# Patient Record
Sex: Male | Born: 1975 | Race: White | Hispanic: No | Marital: Married | Smoking: Former smoker
Health system: Southern US, Community
[De-identification: ages and names within clinical notes are randomized; demographics above are authoritative.]

## PROBLEM LIST (undated history)

## (undated) DIAGNOSIS — K602 Anal fissure, unspecified: Secondary | ICD-10-CM

## (undated) DIAGNOSIS — I10 Essential (primary) hypertension: Secondary | ICD-10-CM

## (undated) DIAGNOSIS — G4733 Obstructive sleep apnea (adult) (pediatric): Secondary | ICD-10-CM

## (undated) DIAGNOSIS — M109 Gout, unspecified: Secondary | ICD-10-CM

## (undated) DIAGNOSIS — F329 Major depressive disorder, single episode, unspecified: Secondary | ICD-10-CM

## (undated) DIAGNOSIS — Z9989 Dependence on other enabling machines and devices: Secondary | ICD-10-CM

## (undated) DIAGNOSIS — K5792 Diverticulitis of intestine, part unspecified, without perforation or abscess without bleeding: Secondary | ICD-10-CM

## (undated) DIAGNOSIS — F32A Depression, unspecified: Secondary | ICD-10-CM

## (undated) DIAGNOSIS — E669 Obesity, unspecified: Secondary | ICD-10-CM

## (undated) DIAGNOSIS — G709 Myoneural disorder, unspecified: Secondary | ICD-10-CM

## (undated) DIAGNOSIS — E079 Disorder of thyroid, unspecified: Secondary | ICD-10-CM

## (undated) DIAGNOSIS — F419 Anxiety disorder, unspecified: Secondary | ICD-10-CM

## (undated) HISTORY — DX: Anal fissure, unspecified: K60.2

## (undated) HISTORY — DX: Depression, unspecified: F32.A

## (undated) HISTORY — DX: Essential (primary) hypertension: I10

## (undated) HISTORY — DX: Anxiety disorder, unspecified: F41.9

## (undated) HISTORY — PX: TOE SURGERY: SHX1073

## (undated) HISTORY — DX: Major depressive disorder, single episode, unspecified: F32.9

## (undated) HISTORY — DX: Diverticulitis of intestine, part unspecified, without perforation or abscess without bleeding: K57.92

## (undated) HISTORY — PX: HIP SURGERY: SHX245

## (undated) HISTORY — DX: Myoneural disorder, unspecified: G70.9

## (undated) HISTORY — DX: Dependence on other enabling machines and devices: Z99.89

## (undated) HISTORY — DX: Gout, unspecified: M10.9

## (undated) HISTORY — DX: Obstructive sleep apnea (adult) (pediatric): G47.33

## (undated) HISTORY — DX: Obesity, unspecified: E66.9

## (undated) HISTORY — DX: Disorder of thyroid, unspecified: E07.9

---

## 2006-04-09 ENCOUNTER — Ambulatory Visit: Payer: Self-pay | Admitting: Family Medicine

## 2008-12-14 ENCOUNTER — Ambulatory Visit: Payer: Self-pay | Admitting: Internal Medicine

## 2008-12-14 ENCOUNTER — Encounter: Payer: Self-pay | Admitting: Internal Medicine

## 2008-12-14 DIAGNOSIS — M546 Pain in thoracic spine: Secondary | ICD-10-CM

## 2008-12-14 DIAGNOSIS — I10 Essential (primary) hypertension: Secondary | ICD-10-CM | POA: Insufficient documentation

## 2009-01-14 ENCOUNTER — Ambulatory Visit: Payer: Self-pay | Admitting: Internal Medicine

## 2009-01-14 DIAGNOSIS — J301 Allergic rhinitis due to pollen: Secondary | ICD-10-CM

## 2009-01-14 LAB — CONVERTED CEMR LAB
ALT: 23 units/L (ref 0–53)
BUN: 14 mg/dL (ref 6–23)
Basophils Absolute: 0 10*3/uL (ref 0.0–0.1)
Bilirubin Urine: NEGATIVE
Bilirubin, Direct: 0.2 mg/dL (ref 0.0–0.3)
CO2: 30 meq/L (ref 19–32)
Chloride: 107 meq/L (ref 96–112)
Cholesterol: 182 mg/dL (ref 0–200)
Creatinine, Ser: 1 mg/dL (ref 0.4–1.5)
Eosinophils Absolute: 0.1 10*3/uL (ref 0.0–0.7)
Glucose, Bld: 95 mg/dL (ref 70–99)
HCT: 43.5 % (ref 39.0–52.0)
Hemoglobin, Urine: NEGATIVE
IgE (Immunoglobulin E), Serum: 21.9 intl units/mL (ref 0.0–180.0)
Leukocytes, UA: NEGATIVE
Lymphocytes Relative: 25.8 % (ref 12.0–46.0)
Lymphs Abs: 1.6 10*3/uL (ref 0.7–4.0)
MCHC: 34.6 g/dL (ref 30.0–36.0)
MCV: 94.8 fL (ref 78.0–100.0)
Monocytes Absolute: 0.4 10*3/uL (ref 0.1–1.0)
Neutrophils Relative %: 65.6 % (ref 43.0–77.0)
Nitrite: NEGATIVE
Platelets: 173 10*3/uL (ref 150.0–400.0)
Potassium: 4.1 meq/L (ref 3.5–5.1)
RDW: 12.6 % (ref 11.5–14.6)
TSH: 3.11 microintl units/mL (ref 0.35–5.50)
Total Bilirubin: 1.2 mg/dL (ref 0.3–1.2)
Total CHOL/HDL Ratio: 3
Total Protein, Urine: NEGATIVE mg/dL
Total Protein: 6.9 g/dL (ref 6.0–8.3)
Triglycerides: 33 mg/dL (ref 0.0–149.0)
Urobilinogen, UA: 0.2 (ref 0.0–1.0)
pH: 6 (ref 5.0–8.0)

## 2009-01-17 ENCOUNTER — Encounter: Payer: Self-pay | Admitting: Internal Medicine

## 2009-06-17 ENCOUNTER — Ambulatory Visit: Payer: Self-pay | Admitting: Internal Medicine

## 2009-06-17 DIAGNOSIS — K602 Anal fissure, unspecified: Secondary | ICD-10-CM

## 2011-01-31 ENCOUNTER — Encounter: Payer: Self-pay | Admitting: Family Medicine

## 2011-01-31 ENCOUNTER — Ambulatory Visit (INDEPENDENT_AMBULATORY_CARE_PROVIDER_SITE_OTHER): Payer: 59 | Admitting: Family Medicine

## 2011-01-31 DIAGNOSIS — E669 Obesity, unspecified: Secondary | ICD-10-CM

## 2011-01-31 DIAGNOSIS — M546 Pain in thoracic spine: Secondary | ICD-10-CM

## 2011-01-31 DIAGNOSIS — F419 Anxiety disorder, unspecified: Secondary | ICD-10-CM

## 2011-01-31 DIAGNOSIS — F418 Other specified anxiety disorders: Secondary | ICD-10-CM | POA: Insufficient documentation

## 2011-01-31 DIAGNOSIS — F341 Dysthymic disorder: Secondary | ICD-10-CM

## 2011-01-31 MED ORDER — CITALOPRAM HYDROBROMIDE 20 MG PO TABS: 20.0000 mg | ORAL_TABLET | Freq: Every day | ORAL | Status: DC

## 2011-01-31 NOTE — Patient Instructions (Signed)
Follow up in 1 month for your complete physical- do not eat before this appt We'll call you with your ortho appt- continue the tylenol or aleve as needed for back pain Start the Celexa daily for mood- if you find it makes you sleepy, take it at dinner Keep up the good work on diet and exercise- that is amazing!!! Call with any questions or concerns Welcome Back!!!

## 2011-01-31 NOTE — Progress Notes (Signed)
  Subjective:    Patient ID: Rodney Mcclain, male    DOB: 1976/03/05, 35 y.o.   MRN: [id number]  HPI Thoracic back pain- occurs daily, almost constantly.  Reports a nagging pain.  Will take Aleve when it gets severe w/out relief.  sxs first started 4-5 yrs ago.  Had back xrays done which showed mild arthritic change at age 69.  Father had similar hx and ended up w/ metastatic cancer.  Has never seen ortho.  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  Feels stuck b/c of the economy.  Wife recently had back surgery and they have a 2 yr old daughter.  Having difficulty sleeping.  Wife wants him to take meds for anxiety, pt feels strongly against meds.  Taking melatonin for sleep w/ some results.  Able to fall asleep but will have difficulty staying asleep- 'can't turn the brain off'.  Drinking a couple of beers nightly to 'take the edge off'.  Obesity- was 455 lbs 5 years ago.  Got down to 237lbs and has gained some weight back.  Goal is now 250lbs.  Exercising regularly.   Review of Systems For ROS see HPI     Objective:   Physical Exam  Constitutional: He is oriented to person, place, and time. He appears well-developed and well-nourished. No distress.       obese  HENT:  Head: Normocephalic and atraumatic.  Eyes: Conjunctivae and EOM are normal. Pupils are equal, round, and reactive to light.  Neck: Normal range of motion. Neck supple. No thyromegaly present.  Cardiovascular: Normal rate, regular rhythm, normal heart sounds and intact distal pulses.   No murmur heard. Pulmonary/Chest: Effort normal and breath sounds normal. No respiratory distress. He has no wheezes.  Abdominal: Soft. Bowel sounds are normal. He exhibits no distension. There is no tenderness.  Musculoskeletal: Normal range of motion.       Thoracic back: He exhibits bony tenderness. He exhibits no deformity.       Back:  Lymphadenopathy:    He has no cervical adenopathy.  Neurological: He is  alert and oriented to person, place, and time. No cranial nerve deficit.  Skin: Skin is warm and dry.  Psychiatric: He has a normal mood and affect. His behavior is normal. Judgment and thought content normal.          Assessment & Plan:

## 2011-02-01 NOTE — Assessment & Plan Note (Signed)
Pt concerned b/c he feels he is too young for arthritis of the spine.  Agree that the thoracic spine is unusual site for arthritis.  Given that pt's father had similar sxs and it was bony metastasis and this is what pt fears- will refer to ortho for complete workup.

## 2011-02-01 NOTE — Assessment & Plan Note (Signed)
This is currently pt's biggest concern.  Given that his stressors are unlikely to change will start low dose meds to see if we can change how he responds to them.  Discussed counseling- pt not interested at this time.  Will follow closely.

## 2011-02-01 NOTE — Assessment & Plan Note (Signed)
Pt has lost considerable amount of weight from where he used to be.  Goal is to be about 50 lbs lighter than his current weight.  Is exercising regularly- which i applauded.  Discussed the importance of healthy food choices.  Pt aware.  Will follow.

## 2011-03-03 ENCOUNTER — Ambulatory Visit (INDEPENDENT_AMBULATORY_CARE_PROVIDER_SITE_OTHER): Payer: 59 | Admitting: Family Medicine

## 2011-03-03 ENCOUNTER — Encounter: Payer: Self-pay | Admitting: *Deleted

## 2011-03-03 DIAGNOSIS — Z0000 Encounter for general adult medical examination without abnormal findings: Secondary | ICD-10-CM

## 2011-03-03 DIAGNOSIS — F341 Dysthymic disorder: Secondary | ICD-10-CM

## 2011-03-03 DIAGNOSIS — F329 Major depressive disorder, single episode, unspecified: Secondary | ICD-10-CM

## 2011-03-03 LAB — CBC WITH DIFFERENTIAL/PLATELET
Basophils Absolute: 0 10*3/uL (ref 0.0–0.1)
Basophils Relative: 0.7 % (ref 0.0–3.0)
Eosinophils Absolute: 0.1 10*3/uL (ref 0.0–0.7)
HCT: 43.2 % (ref 39.0–52.0)
Hemoglobin: 15 g/dL (ref 13.0–17.0)
Lymphs Abs: 1 10*3/uL (ref 0.7–4.0)
MCHC: 34.8 g/dL (ref 30.0–36.0)
MCV: 97.2 fl (ref 78.0–100.0)
Monocytes Absolute: 0.5 10*3/uL (ref 0.1–1.0)
Neutro Abs: 2.7 10*3/uL (ref 1.4–7.7)
Platelets: 142 10*3/uL — ABNORMAL LOW (ref 150.0–400.0)
RBC: 4.44 Mil/uL (ref 4.22–5.81)
RDW: 13.7 % (ref 11.5–14.6)

## 2011-03-03 LAB — LIPID PANEL
Cholesterol: 182 mg/dL (ref 0–200)
HDL: 74 mg/dL (ref >39.00)
LDL Cholesterol: 103 mg/dL — ABNORMAL HIGH (ref 0–99)
Total CHOL/HDL Ratio: 2
Triglycerides: 24 mg/dL (ref 0.0–149.0)
VLDL: 4.8 mg/dL (ref 0.0–40.0)

## 2011-03-03 LAB — HEPATIC FUNCTION PANEL
AST: 21 U/L (ref 0–37)
Albumin: 4 g/dL (ref 3.5–5.2)
Alkaline Phosphatase: 50 U/L (ref 39–117)
Bilirubin, Direct: 0.2 mg/dL (ref 0.0–0.3)
Total Bilirubin: 0.8 mg/dL (ref 0.3–1.2)
Total Protein: 6.8 g/dL (ref 6.0–8.3)

## 2011-03-03 LAB — BASIC METABOLIC PANEL
Calcium: 9.2 mg/dL (ref 8.4–10.5)
Creatinine, Ser: 1.1 mg/dL (ref 0.4–1.5)
GFR: 83.5 mL/min (ref >60.00)
Glucose, Bld: 92 mg/dL (ref 70–99)
Potassium: 4.7 mEq/L (ref 3.5–5.1)
Sodium: 142 mEq/L (ref 135–145)

## 2011-03-03 LAB — TSH: TSH: 3.27 u[IU]/mL (ref 0.35–5.50)

## 2011-03-03 MED ORDER — VENLAFAXINE HCL ER 37.5 MG PO CP24: 37.5000 mg | ORAL_CAPSULE | Freq: Every day | ORAL | Status: DC

## 2011-03-03 NOTE — Progress Notes (Signed)
  Subjective:    Patient ID: Rodney Mcclain, male    DOB: 1976/06/23, 35 y.o.   MRN: [id number]  HPI CPE- no concerns today.  Depression- feels mood has improved since starting Celexa.  Sleeping better.  Doesn't notice an impact on motivation.  Doesn't want to go up on dose due to sedation and lack of energy.   Review of Systems Patient reports no  vision/ hearing changes,anorexia, weight change, fever ,adenopathy, persistant / recurrent hoarseness, swallowing issues, chest pain,palpitations, edema,persistant / recurrent cough, hemoptysis, dyspnea(rest, exertional, paroxysmal nocturnal), gastrointestinal  bleeding (melena, rectal bleeding), abdominal pain, excessive heart burn, GU symptoms( dysuria, hematuria, pyuria, voiding/incontinence  Issues) syncope, focal weakness, memory loss,numbness & tingling, skin/hair/nail changes, abnormal bruising/bleeding, musculoskeletal symptoms/signs.     Objective:   Physical Exam BP 120/82  Pulse 58  Temp(Src) 99 F (37.2 C) (Oral)  Resp 16  Ht 6' 0.75" (1.848 m)  Wt 293 lb (132.904 kg)  BMI 38.92 kg/m2  SpO2 98%  General Appearance:    Alert, cooperative, no distress, appears stated age  Head:    Normocephalic, without obvious abnormality, atraumatic  Eyes:    PERRL, conjunctiva/corneas clear, EOM's intact, fundi    benign, both eyes       Ears:    Normal TM's and external ear canals, both ears  Nose:   Nares normal, septum midline, mucosa normal, no drainage   or sinus tenderness  Throat:   Lips, mucosa, and tongue normal; teeth and gums normal  Neck:   Supple, symmetrical, trachea midline, no adenopathy;       thyroid:  No enlargement/tenderness/nodules  Back:     Symmetric, no curvature, ROM normal, no CVA tenderness  Lungs:     Clear to auscultation bilaterally, respirations unlabored  Chest wall:    No tenderness or deformity  Heart:    Regular rate and rhythm, S1 and S2 normal, no murmur, rub   or gallop  Abdomen:     Soft, non-tender,  bowel sounds active all four quadrants,    no masses, no organomegaly  Genitalia:    Normal male without lesion, masses, discharge or tenderness  Rectal:    Deferred  Extremities:   Extremities normal, atraumatic, no cyanosis or edema  Pulses:   2+ and symmetric all extremities  Skin:   Skin color, texture, turgor normal, no rashes or lesions  Lymph nodes:   Cervical, supraclavicular, and axillary nodes normal  Neurologic:   CNII-XII intact. Normal strength, sensation and reflexes      throughout          Assessment & Plan:

## 2011-03-03 NOTE — Patient Instructions (Signed)
Follow up in 4-6 weeks to recheck mood We'll notify you of your lab results Start the Effexor daily Call with any questions or concerns Have a great holiday!

## 2011-03-07 ENCOUNTER — Encounter: Payer: Self-pay | Admitting: Family Medicine

## 2011-03-07 NOTE — Assessment & Plan Note (Signed)
Pt's PE WNL.  Check labs.  Anticipatory guidance provided.  

## 2011-03-07 NOTE — Assessment & Plan Note (Signed)
Mood has improved but is feeling sedated and lethargic on celexa.  Will switch to SNRI which is typically more activating and see if pt's sxs improve.  Will continue to follow closely.

## 2011-04-07 ENCOUNTER — Ambulatory Visit (INDEPENDENT_AMBULATORY_CARE_PROVIDER_SITE_OTHER): Payer: 59 | Admitting: Family Medicine

## 2011-04-07 DIAGNOSIS — M546 Pain in thoracic spine: Secondary | ICD-10-CM

## 2011-04-07 DIAGNOSIS — F341 Dysthymic disorder: Secondary | ICD-10-CM

## 2011-04-07 DIAGNOSIS — F329 Major depressive disorder, single episode, unspecified: Secondary | ICD-10-CM

## 2011-04-07 MED ORDER — VENLAFAXINE HCL ER 75 MG PO CP24: 75.0000 mg | ORAL_CAPSULE | Freq: Every day | ORAL | Status: DC

## 2011-04-07 NOTE — Progress Notes (Signed)
  Subjective:    Patient ID: Rodney Mcclain, male    DOB: [birth year], 35 y.o.   MRN: [id number]  HPI Anxiety/depression- Effexor did not impact mood but improved the sleep.  According to wife mood still needs work- still short, quick to react.  Pt doesn't feel mood is an issue.  Work situation has improved.  Pt reports he just has 'no motivation'.  Thoracic back pain- pt reports ortho 'blew me off'.  Pt and wife remained concerned since father had similar back pain and was told it was arthritis when it was actually cancer.   Review of Systems For ROS see HPI     Objective:   Physical Exam  Constitutional: He appears well-developed and well-nourished. No distress.       obese  Psychiatric: He has a normal mood and affect. His behavior is normal. Judgment and thought content normal.       Somewhat flat affect          Assessment & Plan:

## 2011-04-07 NOTE — Assessment & Plan Note (Signed)
Pt still fears that his back pain is something more than arthritis.  Wife is 'bothering me' to get it evaluated.  She would like him to see her doctor- Venetia Maxon.  Will refer.

## 2011-04-07 NOTE — Patient Instructions (Signed)
Start the new Effexor- 75mg  daily Call me and let me know how it's going Try and force the exercise issue- once you're back in routine you'll thank yourself! Call with any questions or concerns!

## 2011-04-07 NOTE — Assessment & Plan Note (Signed)
Pt's sleep has improved which will likely help depression and anxiety.  Increase effexor to 75mg  daily.  Pt to call and let me know how sxs are after 3-4 weeks.

## 2011-08-04 ENCOUNTER — Ambulatory Visit (INDEPENDENT_AMBULATORY_CARE_PROVIDER_SITE_OTHER): Payer: 59 | Admitting: Family Medicine

## 2011-08-04 VITALS — BP 122/90 | HR 65 | Temp 97.6°F | Ht 72.0 in | Wt 319.0 lb

## 2011-08-04 DIAGNOSIS — J029 Acute pharyngitis, unspecified: Secondary | ICD-10-CM

## 2011-08-04 LAB — POCT RAPID STREP A (OFFICE): Rapid Strep A Screen: NEGATIVE

## 2011-08-04 NOTE — Progress Notes (Signed)
  Subjective:    Patient ID: Rodney Mcclain, male    DOB: 25-Jun-1976, 34 y.o.   MRN: [id number]  HPI Sore throat- sxs started Sunday.  + sick contacts.  No fevers.  + cough at night, typically dry but will loosen in the shower.  + strep contact.  Feeling better than yesterday.  Denies sinus pain/pressure.   Review of Systems For ROS see HPI     Objective:   Physical Exam  Vitals reviewed. Constitutional: He appears well-developed and well-nourished. No distress.  HENT:  Head: Normocephalic and atraumatic.       No TTP over sinuses + turbinate edema + PND TMs normal bilaterally  Eyes: Conjunctivae and EOM are normal. Pupils are equal, round, and reactive to light.  Neck: Normal range of motion. Neck supple.  Cardiovascular: Normal rate, regular rhythm and normal heart sounds.   Pulmonary/Chest: Effort normal and breath sounds normal. No respiratory distress. He has no wheezes.  Lymphadenopathy:    He has no cervical adenopathy.  Skin: Skin is warm and dry.          Assessment & Plan:

## 2011-08-04 NOTE — Patient Instructions (Signed)
Good news!  No strep! This is most likely viral and should improve w/ time Alternate tylenol/ibuprofen as needed for pain/fever Robitussin or Delsym as needed for cough REST! Plenty of fluids! Hang in there!

## 2011-08-05 DIAGNOSIS — J029 Acute pharyngitis, unspecified: Secondary | ICD-10-CM | POA: Insufficient documentation

## 2011-08-05 NOTE — Assessment & Plan Note (Signed)
No evidence of bacterial infxn on exam, rapid strep (-).  Most likely viral/allergy combo.  Reviewed supportive care and red flags that should prompt return.  Pt expressed understanding and is in agreement w/ plan.

## 2012-01-01 ENCOUNTER — Telehealth: Payer: Self-pay | Admitting: Family Medicine

## 2012-01-01 NOTE — Telephone Encounter (Signed)
Agree w/ evaluation.

## 2012-01-01 NOTE — Telephone Encounter (Signed)
Caller: Rodney Mcclain/Patient; PCP: Sheliah Hatch.; CB#: [phone]; Call regarding Abd Pain; Sharp lower abd pain onset 12/31/11. Pain goes away when sitting still. Increases with movement. No pain at present time. Last BM this AM WNL. Advised see in 24 hrs if pain persists. Abd Pain Protocol.

## 2013-06-16 ENCOUNTER — Encounter: Payer: Self-pay | Admitting: Family Medicine

## 2013-06-16 ENCOUNTER — Ambulatory Visit (INDEPENDENT_AMBULATORY_CARE_PROVIDER_SITE_OTHER): Payer: Managed Care, Other (non HMO) | Admitting: Family Medicine

## 2013-06-16 VITALS — BP 140/86 | HR 91 | Temp 97.9°F | Ht 72.75 in | Wt 329.8 lb

## 2013-06-16 DIAGNOSIS — M62838 Other muscle spasm: Secondary | ICD-10-CM | POA: Insufficient documentation

## 2013-06-16 MED ORDER — NAPROXEN 500 MG PO TABS: 500.0000 mg | ORAL_TABLET | Freq: Two times a day (BID) | ORAL | Status: DC

## 2013-06-16 MED ORDER — CYCLOBENZAPRINE HCL 10 MG PO TABS: 10.0000 mg | ORAL_TABLET | Freq: Three times a day (TID) | ORAL | Status: DC | PRN

## 2013-06-16 NOTE — Assessment & Plan Note (Signed)
New.  Pt w/ radicular neck sxs and + trap spasm on left.  Will start by treating trap spasm w/ muscle relaxers and NSAIDs to see if radicular sxs improve.  If not, will get MRI and refer to neurosurg.

## 2013-06-16 NOTE — Patient Instructions (Addendum)
Continue to use the heating pad Start the flexeril at night- can try 1/2 tab during the day but may cause drowsiness Take the Naproxen twice daily- take w/ food- x10 days and then as needed Call if no improvement and we'll get an MRI Hang in there!!

## 2013-06-16 NOTE — Progress Notes (Signed)
  Subjective:    Patient ID: Rodney Mcclain, male    DOB: 30-Jan-1976, 37 y.o.   MRN: [id number]  HPI L shoulder pain- sxs started 2-3 weeks ago w/out known injury.  Woke up in pain.  Pain is located along trap.  No relief w/ massage, heating pad, wife's TENs.  No relief w/ Aleve.  Pain worsens w/ neck movement.  Full ROM of shoulder.  No weakness/numbness of arm.  Review of Systems For ROS see HPI     Objective:   Physical Exam  Vitals reviewed. Constitutional: He is oriented to person, place, and time. He appears well-developed and well-nourished. No distress.  Musculoskeletal:  Full ROM of L shoulder Discomfort w/ forward flexion of neck Pain w/ neck extension and rotation to L  Neurological: He is alert and oriented to person, place, and time. He has normal reflexes. No cranial nerve deficit. Coordination normal.  Skin: Skin is warm and dry.  Psychiatric: He has a normal mood and affect. His behavior is normal.          Assessment & Plan:

## 2013-07-23 ENCOUNTER — Encounter: Payer: Self-pay | Admitting: Family Medicine

## 2013-11-19 ENCOUNTER — Encounter: Payer: Self-pay | Admitting: Family Medicine

## 2013-11-19 ENCOUNTER — Ambulatory Visit (INDEPENDENT_AMBULATORY_CARE_PROVIDER_SITE_OTHER): Payer: Managed Care, Other (non HMO) | Admitting: Family Medicine

## 2013-11-19 VITALS — BP 142/98 | HR 111 | Temp 99.6°F | Resp 16 | Wt 345.2 lb

## 2013-11-19 DIAGNOSIS — R1032 Left lower quadrant pain: Secondary | ICD-10-CM

## 2013-11-19 DIAGNOSIS — R109 Unspecified abdominal pain: Secondary | ICD-10-CM

## 2013-11-19 DIAGNOSIS — R82998 Other abnormal findings in urine: Secondary | ICD-10-CM

## 2013-11-19 DIAGNOSIS — R1031 Right lower quadrant pain: Secondary | ICD-10-CM

## 2013-11-19 LAB — BASIC METABOLIC PANEL
BUN: 15 mg/dL (ref 6–23)
CHLORIDE: 105 meq/L (ref 96–112)
CO2: 25 meq/L (ref 19–32)
CREATININE: 1 mg/dL (ref 0.4–1.5)
Calcium: 8.9 mg/dL (ref 8.4–10.5)
GFR: 85.08 mL/min (ref >60.00)
Glucose, Bld: 104 mg/dL — ABNORMAL HIGH (ref 70–99)
Potassium: 3.8 mEq/L (ref 3.5–5.1)
SODIUM: 139 meq/L (ref 135–145)

## 2013-11-19 LAB — POCT URINALYSIS DIPSTICK
BILIRUBIN UA: NEGATIVE
Blood, UA: NEGATIVE
Glucose, UA: NEGATIVE
Ketones, UA: NEGATIVE
Nitrite, UA: NEGATIVE
PH UA: 6
Protein, UA: NEGATIVE
Spec Grav, UA: <=1.005
Urobilinogen, UA: 0.2

## 2013-11-19 LAB — CBC WITH DIFFERENTIAL/PLATELET
Basophils Absolute: 0 10*3/uL (ref 0.0–0.1)
Basophils Relative: 0.3 % (ref 0.0–3.0)
EOS PCT: 0.9 % (ref 0.0–5.0)
Eosinophils Absolute: 0.1 10*3/uL (ref 0.0–0.7)
HCT: 44.1 % (ref 39.0–52.0)
HEMOGLOBIN: 14.6 g/dL (ref 13.0–17.0)
Lymphocytes Relative: 12.7 % (ref 12.0–46.0)
Lymphs Abs: 1.5 10*3/uL (ref 0.7–4.0)
MCHC: 33.1 g/dL (ref 30.0–36.0)
MCV: 96.5 fl (ref 78.0–100.0)
Monocytes Absolute: 1 10*3/uL (ref 0.1–1.0)
Monocytes Relative: 8.5 % (ref 3.0–12.0)
NEUTROS ABS: 9.2 10*3/uL — AB (ref 1.4–7.7)
NEUTROS PCT: 77.6 % — AB (ref 43.0–77.0)
Platelets: 171 10*3/uL (ref 150.0–400.0)
RBC: 4.57 Mil/uL (ref 4.22–5.81)
RDW: 13 % (ref 11.5–14.6)
WBC: 11.8 10*3/uL — AB (ref 4.5–10.5)

## 2013-11-19 MED ORDER — CIPROFLOXACIN HCL 500 MG PO TABS: 500.0000 mg | ORAL_TABLET | Freq: Two times a day (BID) | ORAL | Status: DC

## 2013-11-19 NOTE — Progress Notes (Signed)
   Subjective:    Patient ID: Rodney Mcclain, male    DOB: 1975/12/08, 38 y.o.   MRN: [id number]  HPI abd pain- sxs started Monday in R flank and traveled around to central abdomen below navel.  Pain is severe w/ BM.  No pain w/ urination.  No hx of kidney stones.  Still has appendix.  No blood in stool.  No recent constipation.  + fever last night.  Pain is constant but will increase in severity.  Has considered going to ER b/c pain is so severe.  No N/V.  Feeling of early satiety and has sensation of needing to have BM almost immediately after eating.  Pain doesn't change w/ eating.   Review of Systems For ROS see HPI     Objective:   Physical Exam  Vitals reviewed. Constitutional: He appears well-developed and well-nourished. No distress.  HENT:  Head: Normocephalic and atraumatic.  Neck: Normal range of motion. Neck supple.  Cardiovascular: Normal rate, regular rhythm and normal heart sounds.   Pulmonary/Chest: Effort normal and breath sounds normal. No respiratory distress. He has no wheezes. He has no rales.  Abdominal: Soft. Bowel sounds are normal. He exhibits no distension and no mass. There is tenderness (bilateral lower quadrant TTP, no CVA tenderness). There is no rebound and no guarding.  Lymphadenopathy:    He has no cervical adenopathy.  Neurological: He is alert.  Skin: Skin is warm and dry.          Assessment & Plan:

## 2013-11-19 NOTE — Assessment & Plan Note (Signed)
New.  Pt's pain is severe for him to come for OV and consider going to ER.  UA shows + leuks, will start Cipro.  Given degree of TTP will get stat CBC and CT abd/pelvis to assess for diverticulitis or possible appendicitis.  Reviewed supportive care and red flags that should prompt return.  Pt expressed understanding and is in agreement w/ plan.

## 2013-11-19 NOTE — Progress Notes (Signed)
Pre visit review using our clinic review tool, if applicable. No additional management support is needed unless otherwise documented below in the visit note. 

## 2013-11-19 NOTE — Patient Instructions (Signed)
We'll notify you of your lab results and CT report After your CT, start Cipro for possible UTI Don't drink or eat anything until after your scan If your symptoms change or worsen, please go to the ER and don't wait for the report Hang in there!!!

## 2013-11-20 ENCOUNTER — Other Ambulatory Visit: Payer: Self-pay | Admitting: General Practice

## 2013-11-20 ENCOUNTER — Encounter: Payer: Self-pay | Admitting: Family Medicine

## 2013-11-20 MED ORDER — METRONIDAZOLE 500 MG PO TABS: 500.0000 mg | ORAL_TABLET | Freq: Three times a day (TID) | ORAL | Status: DC

## 2013-11-21 LAB — URINE CULTURE
Colony Count: NO GROWTH
ORGANISM ID, BACTERIA: NO GROWTH

## 2013-12-15 ENCOUNTER — Encounter: Payer: Self-pay | Admitting: Family Medicine

## 2014-01-19 ENCOUNTER — Encounter: Payer: Self-pay | Admitting: Family Medicine

## 2014-01-19 ENCOUNTER — Ambulatory Visit (INDEPENDENT_AMBULATORY_CARE_PROVIDER_SITE_OTHER): Payer: Managed Care, Other (non HMO) | Admitting: Family Medicine

## 2014-01-19 VITALS — BP 122/82 | HR 68 | Temp 98.2°F | Resp 16 | Wt 351.2 lb

## 2014-01-19 DIAGNOSIS — M109 Gout, unspecified: Secondary | ICD-10-CM | POA: Insufficient documentation

## 2014-01-19 LAB — URIC ACID: URIC ACID, SERUM: 7.7 mg/dL (ref 4.0–7.8)

## 2014-01-19 MED ORDER — ALLOPURINOL 100 MG PO TABS: 100.0000 mg | ORAL_TABLET | Freq: Every day | ORAL | Status: DC

## 2014-01-19 MED ORDER — INDOMETHACIN 50 MG PO CAPS: ORAL_CAPSULE | ORAL | Status: DC

## 2014-01-19 NOTE — Assessment & Plan Note (Signed)
New to provider, ongoing problem for pt.  Start Allopurinol daily.  Indomethacin prn.  Reviewed importance of low purine diet.  Check UA level.  Reviewed supportive care and red flags that should prompt return.  Pt expressed understanding and is in agreement w/ plan.

## 2014-01-19 NOTE — Patient Instructions (Signed)
Follow up as needed Start the Allopurinol daily as a gout preventative Use the Indomethacin as directed to treat a gout flare- only use as long as needed until pain improves Try and follow a low purine diet Call with any questions or concerns Happy Birthday!!!  Gout Gout is an inflammatory arthritis caused by a buildup of uric acid crystals in the joints. Uric acid is a chemical that is normally present in the blood. When the level of uric acid in the blood is too high it can form crystals that deposit in your joints and tissues. This causes joint redness, soreness, and swelling (inflammation). Repeat attacks are common. Over time, uric acid crystals can form into masses (tophi) near a joint, destroying bone and causing disfigurement. Gout is treatable and often preventable. CAUSES  The disease begins with elevated levels of uric acid in the blood. Uric acid is produced by your body when it breaks down a naturally found substance called purines. Certain foods you eat, such as meats and fish, contain high amounts of purines. Causes of an elevated uric acid level include:  Being passed down from parent to child (heredity).  Diseases that cause increased uric acid production (such as obesity, psoriasis, and certain cancers).  Excessive alcohol use.  Diet, especially diets rich in meat and seafood.  Medicines, including certain cancer-fighting medicines (chemotherapy), water pills (diuretics), and aspirin.  Chronic kidney disease. The kidneys are no longer able to remove uric acid well.  Problems with metabolism. Conditions strongly associated with gout include:  Obesity.  High blood pressure.  High cholesterol.  Diabetes. Not everyone with elevated uric acid levels gets gout. It is not understood why some people get gout and others do not. Surgery, joint injury, and eating too much of certain foods are some of the factors that can lead to gout attacks. SYMPTOMS   An attack of gout  comes on quickly. It causes intense pain with redness, swelling, and warmth in a joint.  Fever can occur.  Often, only one joint is involved. Certain joints are more commonly involved:  Base of the big toe.  Knee.  Ankle.  Wrist.  Finger. Without treatment, an attack usually goes away in a few days to weeks. Between attacks, you usually will not have symptoms, which is different from many other forms of arthritis. DIAGNOSIS  Your caregiver will suspect gout based on your symptoms and exam. In some cases, tests may be recommended. The tests may include:  Blood tests.  Urine tests.  X-rays.  Joint fluid exam. This exam requires a needle to remove fluid from the joint (arthrocentesis). Using a microscope, gout is confirmed when uric acid crystals are seen in the joint fluid. TREATMENT  There are two phases to gout treatment: treating the sudden onset (acute) attack and preventing attacks (prophylaxis).  Treatment of an Acute Attack.  Medicines are used. These include anti-inflammatory medicines or steroid medicines.  An injection of steroid medicine into the affected joint is sometimes necessary.  The painful joint is rested. Movement can worsen the arthritis.  You may use warm or cold treatments on painful joints, depending which works best for you.  Treatment to Prevent Attacks.  If you suffer from frequent gout attacks, your caregiver may advise preventive medicine. These medicines are started after the acute attack subsides. These medicines either help your kidneys eliminate uric acid from your body or decrease your uric acid production. You may need to stay on these medicines for a very long time.  The early phase of treatment with preventive medicine can be associated with an increase in acute gout attacks. For this reason, during the first few months of treatment, your caregiver may also advise you to take medicines usually used for acute gout treatment. Be sure you  understand your caregiver's directions. Your caregiver may make several adjustments to your medicine dose before these medicines are effective.  Discuss dietary treatment with your caregiver or dietitian. Alcohol and drinks high in sugar and fructose and foods such as meat, poultry, and seafood can increase uric acid levels. Your caregiver or dietician can advise you on drinks and foods that should be limited. HOME CARE INSTRUCTIONS   Do not take aspirin to relieve pain. This raises uric acid levels.  Only take over-the-counter or prescription medicines for pain, discomfort, or fever as directed by your caregiver.  Rest the joint as much as possible. When in bed, keep sheets and blankets off painful areas.  Keep the affected joint raised (elevated).  Apply warm or cold treatments to painful joints. Use of warm or cold treatments depends on which works best for you.  Use crutches if the painful joint is in your leg.  Drink enough fluids to keep your urine clear or pale yellow. This helps your body get rid of uric acid. Limit alcohol, sugary drinks, and fructose drinks.  Follow your dietary instructions. Pay careful attention to the amount of protein you eat. Your daily diet should emphasize fruits, vegetables, whole grains, and fat-free or low-fat milk products. Discuss the use of coffee, vitamin C, and cherries with your caregiver or dietician. These may be helpful in lowering uric acid levels.  Maintain a healthy body weight. SEEK MEDICAL CARE IF:   You develop diarrhea, vomiting, or any side effects from medicines.  You do not feel better in 24 hours, or you are getting worse. SEEK IMMEDIATE MEDICAL CARE IF:   Your joint becomes suddenly more tender, and you have chills or a fever. MAKE SURE YOU:   Understand these instructions.  Will watch your condition.  Will get help right away if you are not doing well or get worse. Document Released: 09/22/2000 Document Revised:  01/20/2013 Document Reviewed: 05/08/2012 [facility] Patient Information 2014 [place].

## 2014-01-19 NOTE — Progress Notes (Signed)
Pre visit review using our clinic review tool, if applicable. No additional management support is needed unless otherwise documented below in the visit note. 

## 2014-01-19 NOTE — Progress Notes (Signed)
   Subjective:    Patient ID: Rodney Mcclain, male    DOB: Oct 17, 1975, 38 y.o.   MRN: [id number]  HPI Gout- went to UC on 4/1 for pain and swelling in R great toe.  Was started on Colcrys but this was ineffective.  Swelling has improved somewhat and he is now able to bend toe but still painful.   Review of Systems For ROS see HPI     Objective:   Physical Exam  Vitals reviewed. Constitutional: He is oriented to person, place, and time. He appears well-developed and well-nourished. No distress.  obese  Cardiovascular: Intact distal pulses.   Musculoskeletal: He exhibits edema (over R great toe IP joint) and tenderness (over R great toe IP joint).  Neurological: He is alert and oriented to person, place, and time. Coordination normal.  Skin: Skin is warm and dry. No rash noted. There is erythema.  Psychiatric: He has a normal mood and affect. His behavior is normal.          Assessment & Plan:

## 2014-06-23 ENCOUNTER — Ambulatory Visit (INDEPENDENT_AMBULATORY_CARE_PROVIDER_SITE_OTHER): Payer: Managed Care, Other (non HMO) | Admitting: Physician Assistant

## 2014-06-23 ENCOUNTER — Encounter: Payer: Self-pay | Admitting: Physician Assistant

## 2014-06-23 VITALS — HR 63 | Temp 99.0°F | Resp 16 | Ht 72.0 in | Wt 348.0 lb

## 2014-06-23 DIAGNOSIS — L02619 Cutaneous abscess of unspecified foot: Secondary | ICD-10-CM

## 2014-06-23 DIAGNOSIS — L03031 Cellulitis of right toe: Secondary | ICD-10-CM

## 2014-06-23 DIAGNOSIS — L03039 Cellulitis of unspecified toe: Secondary | ICD-10-CM

## 2014-06-23 MED ORDER — SULFAMETHOXAZOLE-TRIMETHOPRIM 800-160 MG PO TABS: 1.0000 | ORAL_TABLET | Freq: Two times a day (BID) | ORAL | Status: DC

## 2014-06-23 NOTE — Patient Instructions (Signed)
Please take antibiotic as directed with food.  Take an antiinflammatory daily -- Aleve/Ibuprofen.  Keep foot clean and dry.  Wear protective shoes.  Call or return to clinic if symptoms are not improving.

## 2014-06-23 NOTE — Progress Notes (Signed)
Pre visit review using our clinic review tool, if applicable. No additional management support is needed unless otherwise documented below in the visit note/SLS  

## 2014-06-24 DIAGNOSIS — L03032 Cellulitis of left toe: Secondary | ICD-10-CM | POA: Insufficient documentation

## 2014-06-24 NOTE — Assessment & Plan Note (Signed)
Rx Bactrim. Elevate extremity.  Wear protective shoes while ambulating. ICE to area.  Return if symptoms not improving over the next 48 hours.

## 2014-06-24 NOTE — Progress Notes (Signed)
    Patient presents to clinic today c/o redness, warmth and swelling of the distal phalanx of left great toe x 2 weeks.  Denies known trauma or injury.  Denies decreased ROM.  Denies drainage from site.  Endorses history of gout but states this feels different to him.  Denies fever, chills, sweats.  Past Medical History  Diagnosis Date  . Hypertension   . Depression   . Gout     Current Outpatient Prescriptions on File Prior to Visit  Medication Sig Dispense Refill  . allopurinol (ZYLOPRIM) 100 MG tablet Take 1 tablet (100 mg total) by mouth daily.  30 tablet  6  . indomethacin (INDOCIN) 50 MG capsule 1 tab TID at first sign of flare and as pain improves decrease to BID and then daily and then D/C as able  60 capsule  0  . Multiple Vitamin (MULTIVITAMIN PO) Take 2 tablets by mouth.         No current facility-administered medications on file prior to visit.    No Known Allergies  Family History  Problem Relation Age of Onset  . Arthritis Mother   . Arthritis Father   . Arthritis Maternal Grandfather   . Lung cancer      aunt  . Heart disease Mother   . Stroke Maternal Grandfather   . Hypertension Mother   . Hypertension      all grandparents  . Diabetes Mother   . Diabetes Maternal Grandmother   . Kidney cancer Father     History   Social History  . Marital Status: Married    Spouse Name: N/A    Number of Children: N/A  . Years of Education: N/A   Social History Main Topics  . Smoking status: Former Smoker    Quit date: 10/09/2005  . Smokeless tobacco: None  . Alcohol Use: Yes     Comment: occ.  . Drug Use: No  . Sexual Activity:    Other Topics Concern  . None   Social History Narrative  . None   Review of Systems - See HPI.  All other ROS are negative.  Pulse 63  Temp(Src) 99 F (37.2 C) (Oral)  Resp 16  Ht 6' (1.829 m)  Wt 348 lb (157.852 kg)  BMI 47.19 kg/m2  SpO2 97%  Physical Exam  Constitutional: He is oriented to person, place, and  time and well-developed, well-nourished, and in no distress.  HENT:  Head: Normocephalic and atraumatic.  Eyes: Conjunctivae are normal.  Cardiovascular: Normal rate, regular rhythm, normal heart sounds and intact distal pulses.   Pulmonary/Chest: Effort normal and breath sounds normal. No respiratory distress. He has no wheezes. He has no rales. He exhibits no tenderness.  Musculoskeletal:       Feet:  Neurological: He is alert and oriented to person, place, and time.  Skin: Skin is warm and dry.  Psychiatric: Affect normal.   Assessment/Plan: Cellulitis of toe of left foot Rx Bactrim. Elevate extremity.  Wear protective shoes while ambulating. ICE to area.  Return if symptoms not improving over the next 48 hours.

## 2015-01-07 ENCOUNTER — Ambulatory Visit (INDEPENDENT_AMBULATORY_CARE_PROVIDER_SITE_OTHER): Payer: Managed Care, Other (non HMO) | Admitting: Medical

## 2015-01-07 ENCOUNTER — Encounter: Payer: Self-pay | Admitting: Medical

## 2015-01-07 ENCOUNTER — Encounter: Payer: Self-pay | Admitting: Physician Assistant

## 2015-01-07 VITALS — BP 134/84 | HR 88 | Temp 98.9°F | Ht 72.0 in | Wt 341.6 lb

## 2015-01-07 DIAGNOSIS — R109 Unspecified abdominal pain: Secondary | ICD-10-CM | POA: Insufficient documentation

## 2015-01-07 DIAGNOSIS — R1084 Generalized abdominal pain: Secondary | ICD-10-CM | POA: Diagnosis not present

## 2015-01-07 DIAGNOSIS — R8290 Unspecified abnormal findings in urine: Secondary | ICD-10-CM | POA: Diagnosis not present

## 2015-01-07 LAB — COMPREHENSIVE METABOLIC PANEL
ALT: 16 U/L (ref 0–53)
AST: 17 U/L (ref 0–37)
Albumin: 4.1 g/dL (ref 3.5–5.2)
Alkaline Phosphatase: 60 U/L (ref 39–117)
BILIRUBIN TOTAL: 1 mg/dL (ref 0.2–1.2)
BUN: 16 mg/dL (ref 6–23)
CALCIUM: 9.6 mg/dL (ref 8.4–10.5)
CO2: 31 mEq/L (ref 19–32)
CREATININE: 1.08 mg/dL (ref 0.40–1.50)
Chloride: 104 mEq/L (ref 96–112)
GFR: 80.1 mL/min (ref >60.00)
Glucose, Bld: 84 mg/dL (ref 70–99)
Potassium: 4.4 mEq/L (ref 3.5–5.1)
Sodium: 138 mEq/L (ref 135–145)
Total Protein: 7.1 g/dL (ref 6.0–8.3)

## 2015-01-07 LAB — CBC WITH DIFFERENTIAL/PLATELET
Basophils Absolute: 0 10*3/uL (ref 0.0–0.1)
Basophils Relative: 0.3 % (ref 0.0–3.0)
EOS ABS: 0.1 10*3/uL (ref 0.0–0.7)
Eosinophils Relative: 1.1 % (ref 0.0–5.0)
HCT: 43.6 % (ref 39.0–52.0)
Hemoglobin: 15.1 g/dL (ref 13.0–17.0)
LYMPHS PCT: 13.1 % (ref 12.0–46.0)
Lymphs Abs: 1.5 10*3/uL (ref 0.7–4.0)
MCHC: 34.7 g/dL (ref 30.0–36.0)
MCV: 93.5 fl (ref 78.0–100.0)
MONOS PCT: 7.2 % (ref 3.0–12.0)
Monocytes Absolute: 0.8 10*3/uL (ref 0.1–1.0)
NEUTROS PCT: 78.3 % — AB (ref 43.0–77.0)
Neutro Abs: 9.1 10*3/uL — ABNORMAL HIGH (ref 1.4–7.7)
PLATELETS: 193 10*3/uL (ref 150.0–400.0)
RBC: 4.66 Mil/uL (ref 4.22–5.81)
RDW: 13.6 % (ref 11.5–15.5)
WBC: 11.6 10*3/uL — ABNORMAL HIGH (ref 4.0–10.5)

## 2015-01-07 MED ORDER — CIPROFLOXACIN HCL 500 MG PO TABS: 500.0000 mg | ORAL_TABLET | Freq: Two times a day (BID) | ORAL | Status: DC

## 2015-01-07 MED ORDER — METRONIDAZOLE 500 MG PO TABS: 500.0000 mg | ORAL_TABLET | Freq: Three times a day (TID) | ORAL | Status: DC

## 2015-01-07 NOTE — Assessment & Plan Note (Signed)
Lower quadrants and suprapubic. Worse in llq. Hx of diverticulitis. Pt, wife and I discussed likely diverticulitis but somewhat atypical features of some rlq pain(so watch closely). Benefit of Ct explained as we approach weekend. Ct deferred presently by pt. Will rx cipro and flaygl. Get cbc, cmp stat and ua today.   Pt agrees that if pain worsens  or changes over weekend to be seen in ED.([facility] could be option but not sure they have access to imaging)

## 2015-01-07 NOTE — Progress Notes (Signed)
Pre visit review using our clinic review tool, if applicable. No additional management support is needed unless otherwise documented below in the visit note. 

## 2015-01-07 NOTE — Progress Notes (Signed)
Subjective:    Patient ID: Rodney Mcclain, male    DOB: [birth year], 39 y.o.   MRN: [id number]  HPI  Pt in with some lower area pain going on 3 days. Pain in left lower quadrant area and some in suprapubic area as well as rt lower quadrant. Pt states pain was is exact same as it was on last time one year ago.   I reviewed note and description of pain is some different.(Some similarities)  Pt had some diverticulitis likely on last CT past feb. He did get better with antibiotics. No fever, no chills, no nausea,no vomiting, no diarrhea and no constipiation.  No family history of colon cancer.  Review of Systems  Constitutional: Negative for fever, chills and fatigue.  Respiratory: Negative for cough, chest tightness, shortness of breath and wheezing.   Cardiovascular: Negative for chest pain and palpitations.  Gastrointestinal: Positive for abdominal pain. Negative for nausea, vomiting, diarrhea, constipation, blood in stool, abdominal distention and anal bleeding.  Endocrine: Negative for polydipsia, polyphagia and polyuria.  Musculoskeletal: Negative for back pain.  Hematological: Negative for adenopathy. Does not bruise/bleed easily.   Past Medical History  Diagnosis Date  . Hypertension   . Depression   . Gout     History   Social History  . Marital Status: Married    Spouse Name: N/A  . Number of Children: N/A  . Years of Education: N/A   Occupational History  . Not on file.   Social History Main Topics  . Smoking status: Former Smoker    Quit date: 10/09/2005  . Smokeless tobacco: Not on file  . Alcohol Use: Yes     Comment: occ.  . Drug Use: No  . Sexual Activity: Not on file   Other Topics Concern  . Not on file   Social History Narrative    Past Surgical History  Procedure Laterality Date  . Toe surgery      bilateral for bone spurs  . Hip surgery      bilateral pin placement    Family History  Problem Relation Age of Onset  . Arthritis Mother     . Arthritis Father   . Arthritis Maternal Grandfather   . Lung cancer      aunt  . Heart disease Mother   . Stroke Maternal Grandfather   . Hypertension Mother   . Hypertension      all grandparents  . Diabetes Mother   . Diabetes Maternal Grandmother   . Kidney cancer Father     No Known Allergies  Current Outpatient Prescriptions on File Prior to Visit  Medication Sig Dispense Refill  . Multiple Vitamin (MULTIVITAMIN PO) Take 2 tablets by mouth.      Marland Kitchen. allopurinol (ZYLOPRIM) 100 MG tablet Take 1 tablet (100 mg total) by mouth daily. (Patient not taking: Reported on 01/07/2015) 30 tablet 6  . indomethacin (INDOCIN) 50 MG capsule 1 tab TID at first sign of flare and as pain improves decrease to BID and then daily and then D/C as able (Patient not taking: Reported on 01/07/2015) 60 capsule 0   No current facility-administered medications on file prior to visit.    BP 134/84 mmHg  Pulse 88  Temp(Src) 98.9 F (37.2 C) (Oral)  Ht 6' (1.829 m)  Wt 341 lb 9.6 oz (154.949 kg)  BMI 46.32 kg/m2  SpO2 98%      Objective:   Physical Exam   General Appearance- Not in acute distress.  HEENT  Eyes- Scleraeral/Conjuntiva-bilat- Not Yellow. Mouth & Throat- Normal.  Chest and Lung Exam Auscultation: Breath sounds:-Normal. Adventitious sounds:- No Adventitious sounds.  Cardiovascular Auscultation:Rythm - Regular. Heart Sounds -Normal heart sounds.  Abdomen Inspection:-Inspection Normal.  Palpation/Perucssion: Palpation and Percussion of the abdomen reveal- Tender llq, suprapubic and rlq,(minimal faint rlq heal jar). Rlq pain max 5/10. Suprapubic and llq pain 7-8/10 on palpation.  No Rebound tenderness, No rigidity(Guarding) and No Palpable abdominal masses.  Liver:-Normal.  Spleen:- Normal.   Rectal Anorectal Exam: Stool - Hemoccult of stool/mucous is Heme Negative. External - normal external exam. Internal - normal sphincter tone. No rectal mass.  Back- no cva  tenderness     Assessment & Plan:

## 2015-01-07 NOTE — Patient Instructions (Addendum)
Pain in the abdomen Lower quadrants and suprapubic. Worse in llq. Hx of diverticulitis. Pt, wife and I discussed likely diverticulitis but somewhat atypical features of some rlq pain(so watch closely). Benefit of Ct explained as we approach weekend. Ct deferred presently by pt. Will rx cipro and flaygl. Get cbc, cmp stat and ua today.   Pt agrees that if pain worsens  or changes over weekend to be seen in ED.([facility] could be [facility] but not sure they have access to imaging)        Will also go ahead and refer to GI since 2 potential episodes of diverticulitis in 13 months.  Follow up Monday or as needed.  Diverticulitis Diverticulitis is inflammation or infection of small pouches in your colon that form when you have a condition called diverticulosis. The pouches in your colon are called diverticula. Your colon, or large intestine, is where water is absorbed and stool is formed. Complications of diverticulitis can include:  Bleeding.  Severe infection.  Severe pain.  Perforation of your colon.  Obstruction of your colon. CAUSES  Diverticulitis is caused by bacteria. Diverticulitis happens when stool becomes trapped in diverticula. This allows bacteria to grow in the diverticula, which can lead to inflammation and infection. RISK FACTORS People with diverticulosis are at risk for diverticulitis. Eating a diet that does not include enough fiber from fruits and vegetables may make diverticulitis more likely to develop. SYMPTOMS  Symptoms of diverticulitis may include:  Abdominal pain and tenderness. The pain is normally located on the left side of the abdomen, but may occur in other areas.  Fever and chills.  Bloating.  Cramping.  Nausea.  Vomiting.  Constipation.  Diarrhea.  Blood in your stool. DIAGNOSIS  Your health care provider will ask you about your medical history and do a physical exam. You may need to have tests done because many medical conditions can cause  the same symptoms as diverticulitis. Tests may include:  Blood tests.  Urine tests.  Imaging tests of the abdomen, including X-rays and CT scans. When your condition is under control, your health care provider may recommend that you have a colonoscopy. A colonoscopy can show how severe your diverticula are and whether something else is causing your symptoms. TREATMENT  Most cases of diverticulitis are mild and can be treated at home. Treatment may include:  Taking over-the-counter pain medicines.  Following a clear liquid diet.  Taking antibiotic medicines by mouth for 7-10 days. More severe cases may be treated at a hospital. Treatment may include:  Not eating or drinking.  Taking prescription pain medicine.  Receiving antibiotic medicines through an IV tube.  Receiving fluids and nutrition through an IV tube.  Surgery. HOME CARE INSTRUCTIONS   Follow your health care provider's instructions carefully.  Follow a full liquid diet or other diet as directed by your health care provider. After your symptoms improve, your health care provider may tell you to change your diet. He or she may recommend you eat a high-fiber diet. Fruits and vegetables are good sources of fiber. Fiber makes it easier to pass stool.  Take fiber supplements or probiotics as directed by your health care provider.  Only take medicines as directed by your health care provider.  Keep all your follow-up appointments. SEEK MEDICAL CARE IF:   Your pain does not improve.  You have a hard time eating food.  Your bowel movements do not return to normal. SEEK IMMEDIATE MEDICAL CARE IF:   Your pain becomes worse.  Your symptoms do not get better.  Your symptoms suddenly get worse.  You have a fever.  You have repeated vomiting.  You have bloody or black, tarry stools. MAKE SURE YOU:   Understand these instructions.  Will watch your condition.  Will get help right away if you are not doing  well or get worse. Document Released: 07/05/2005 Document Revised: 09/30/2013 Document Reviewed: 08/20/2013 [facility] Patient Information 2015 [place], [place]. This information is not intended to replace advice given to you by your health care provider. Make sure you discuss any questions you have with your health care provider.

## 2015-01-09 LAB — URINE CULTURE
Colony Count: NO GROWTH
Organism ID, Bacteria: NO GROWTH

## 2015-01-11 ENCOUNTER — Encounter: Payer: Self-pay | Admitting: Medical

## 2015-01-11 ENCOUNTER — Ambulatory Visit (INDEPENDENT_AMBULATORY_CARE_PROVIDER_SITE_OTHER): Payer: Managed Care, Other (non HMO) | Admitting: Medical

## 2015-01-11 VITALS — BP 133/89 | HR 79 | Temp 97.9°F | Ht 73.0 in | Wt 341.2 lb

## 2015-01-11 DIAGNOSIS — R1031 Right lower quadrant pain: Secondary | ICD-10-CM

## 2015-01-11 DIAGNOSIS — R1032 Left lower quadrant pain: Secondary | ICD-10-CM | POA: Diagnosis not present

## 2015-01-11 NOTE — Patient Instructions (Signed)
Abdominal pain, bilateral lower quadrant Your symptoms have improved significantly. With your response to antibiotics, I do think diverticulitis likely occurred. Continue antibiotics. If symptoms change or worsen prior to GI appointment let us know. Imaging studies not indicated at this point.  Your infection fighting cells were mild elevated. But better than last year. Clinically, I don't think  Lab repeat necessary at this point.  Follow up as needed prior to GI appointment.

## 2015-01-11 NOTE — Progress Notes (Signed)
Subjective:    Patient ID: Rodney Mcclain, male    DOB: 1976-02-18, 39 y.o.   MRN: [id number]  HPI   Pt in very faint minimal pain at this point. Mostly now  left lower quadrant pain with some faint mid suprapubic pain as well. Pt does have appointment next Friday 15 th with GI. Pain level 1/10 only using bathroom. Sitting with at rest no pain. See ros. Pt states Saturday afternoon symptoms improved.    Review of Systems  Constitutional: Negative for fever, chills and diaphoresis.  Respiratory: Negative for cough, chest tightness and wheezing.   Cardiovascular: Negative for chest pain and palpitations.  Gastrointestinal: Positive for abdominal pain. Negative for nausea, vomiting, diarrhea, constipation, abdominal distention and anal bleeding.       Only faint pain on bm now.  Musculoskeletal: Negative for back pain.  Neurological: Negative for dizziness and light-headedness.  Hematological: Negative for adenopathy. Does not bruise/bleed easily.   Past Medical History  Diagnosis Date  . Hypertension   . Depression   . Gout     History   Social History  . Marital Status: Married    Spouse Name: N/A  . Number of Children: N/A  . Years of Education: N/A   Occupational History  . Not on file.   Social History Main Topics  . Smoking status: Former Smoker    Quit date: 10/09/2005  . Smokeless tobacco: Not on file  . Alcohol Use: Yes     Comment: occ.  . Drug Use: No  . Sexual Activity: Not on file   Other Topics Concern  . Not on file   Social History Narrative    Past Surgical History  Procedure Laterality Date  . Toe surgery      bilateral for bone spurs  . Hip surgery      bilateral pin placement    Family History  Problem Relation Age of Onset  . Arthritis Mother   . Arthritis Father   . Arthritis Maternal Grandfather   . Lung cancer      aunt  . Heart disease Mother   . Stroke Maternal Grandfather   . Hypertension Mother   . Hypertension      all  grandparents  . Diabetes Mother   . Diabetes Maternal Grandmother   . Kidney cancer Father     No Known Allergies  Current Outpatient Prescriptions on File Prior to Visit  Medication Sig Dispense Refill  . ciprofloxacin (CIPRO) 500 MG tablet Take 1 tablet (500 mg total) by mouth 2 (two) times daily. 20 tablet 0  . metroNIDAZOLE (FLAGYL) 500 MG tablet Take 1 tablet (500 mg total) by mouth 3 (three) times daily. 21 tablet 0  . Multiple Vitamin (MULTIVITAMIN PO) Take 2 tablets by mouth.       No current facility-administered medications on file prior to visit.    BP 133/89 mmHg  Pulse 79  Temp(Src) 97.9 F (36.6 C) (Oral)  Ht  (1.854 m)  Wt 341 lb 3.2 oz (154.767 kg)  BMI 45.03 kg/m2  SpO2 99%      Objective:   Physical Exam  General Appearance- Not in acute distress.  HEENT Eyes- Scleraeral/Conjuntiva-bilat- Not Yellow. Mouth & Throat- Normal.  Chest and Lung Exam Auscultation: Breath sounds:-Normal. Adventitious sounds:- No Adventitious sounds.  Cardiovascular Auscultation:Rythm - Regular. Heart Sounds -Normal heart sounds.  Abdomen Inspection:-Inspection Normal.  Palpation/Perucssion: Palpation and Percussion of the abdomen reveal- faint  Tender llq and mid suprapubic(no rlq  pain at all), No Rebound tenderness, No rigidity(Guarding) and No Palpable abdominal masses.  Liver:-Normal.  Spleen:- Normal.   Back- no cva tenderness      Assessment & Plan:

## 2015-01-11 NOTE — Assessment & Plan Note (Signed)
Your symptoms have improved significantly. With your response to antibiotics, I do think diverticulitis likely occurred. Continue antibiotics. If symptoms change or worsen prior to GI appointment let us know. Imaging studies not indicated at this point.  Your infection fighting cells were mild elevated. But better than last year. Clinically, I don't think  Lab repeat necessary at this point.  Follow up as needed prior to GI appointment.

## 2015-01-11 NOTE — Progress Notes (Signed)
Pre visit review using our clinic review tool, if applicable. No additional management support is needed unless otherwise documented below in the visit note. 

## 2015-01-15 ENCOUNTER — Encounter: Payer: Self-pay | Admitting: Gastroenterology

## 2015-01-22 ENCOUNTER — Encounter: Payer: Self-pay | Admitting: Physician Assistant

## 2015-01-22 ENCOUNTER — Ambulatory Visit (INDEPENDENT_AMBULATORY_CARE_PROVIDER_SITE_OTHER): Payer: Managed Care, Other (non HMO) | Admitting: Physician Assistant

## 2015-01-22 ENCOUNTER — Other Ambulatory Visit: Payer: Self-pay

## 2015-01-22 VITALS — BP 134/90 | HR 72 | Ht 71.0 in | Wt 345.0 lb

## 2015-01-22 DIAGNOSIS — R1032 Left lower quadrant pain: Secondary | ICD-10-CM

## 2015-01-22 DIAGNOSIS — R195 Other fecal abnormalities: Secondary | ICD-10-CM | POA: Diagnosis not present

## 2015-01-22 MED ORDER — DICYCLOMINE HCL 10 MG PO CAPS
10.0000 mg | ORAL_CAPSULE | Freq: Three times a day (TID) | ORAL | Status: DC
Start: 2015-01-22 — End: 2015-04-29

## 2015-01-22 NOTE — Patient Instructions (Signed)
You have been scheduled for a colonoscopy. Please follow written instructions given to you at your visit today.  Please pick up your prep supplies at the pharmacy within the next 1-3 days. If you use inhalers (even only as needed), please bring them with you on the day of your procedure. Your physician has requested that you go to www.startemmi.com and enter the access code given to you at your visit today. This web site gives a general overview about your procedure. However, you should still follow specific instructions given to you by our office regarding your preparation for the procedure. We have sent the following medications to your pharmacy for you to pick up at your convenience:  Bentyl High-Fiber Diet Fiber is found in fruits, vegetables, and grains. A high-fiber diet encourages the addition of more whole grains, legumes, fruits, and vegetables in your diet. The recommended amount of fiber for adult males is 38 g per day. For adult females, it is 25 g per day. Pregnant and lactating women should get 28 g of fiber per day. If you have a digestive or bowel problem, ask your caregiver for advice before adding high-fiber foods to your diet. Eat a variety of high-fiber foods instead of only a select few type of foods.  PURPOSE  To increase stool bulk.  To make bowel movements more regular to prevent constipation.  To lower cholesterol.  To prevent overeating. WHEN IS THIS DIET USED?  It may be used if you have constipation and hemorrhoids.   It may be used if you have uncomplicated diverticulosis (intestine condition) and irritable bowel syndrome.   Fat and Cholesterol Control Diet Fat and cholesterol levels in your blood and organs are influenced by your diet. High levels of fat and cholesterol may lead to diseases of the heart, small and large blood vessels, gallbladder, liver, and pancreas. CONTROLLING FAT AND CHOLESTEROL WITH DIET Although exercise and lifestyle factors are  important, your diet is key. That is because certain foods are known to raise cholesterol and others to lower it. The goal is to balance foods for their effect on cholesterol and more importantly, to replace saturated and trans fat with other types of fat, such as monounsaturated fat, polyunsaturated fat, and omega-3 fatty acids. On average, a person should consume no more than 15 to 17 g of saturated fat daily. Saturated and trans fats are considered "bad" fats, and they will raise LDL cholesterol. Saturated fats are primarily found in animal products such as meats, butter, and cream. However, that does not mean you need to give up all your favorite foods. Today, there are good tasting, low-fat, low-cholesterol substitutes for most of the things you like to eat. Choose low-fat or nonfat alternatives. Choose round or loin cuts of red meat. These types of cuts are lowest in fat and cholesterol. Chicken (without the skin), fish, veal, and ground [place] breast are great choices. Eliminate fatty meats, such as hot dogs and salami. Even shellfish have little or no saturated fat. Have a 3 oz (85 g) portion when you eat lean meat, poultry, or fish. Trans fats are also called "partially hydrogenated oils." They are oils that have been scientifically manipulated so that they are solid at room temperature resulting in a longer shelf life and improved taste and texture of foods in which they are added. Trans fats are found in stick margarine, some tub margarines, cookies, crackers, and baked goods.  When baking and cooking, oils are a great substitute for butter. The  monounsaturated oils are especially beneficial since it is believed they lower LDL and raise HDL. The oils you should avoid entirely are saturated tropical oils, such as coconut and palm.  Remember to eat a lot from food groups that are naturally free of saturated and trans fat, including fish, fruit, vegetables, beans, grains (barley, rice, couscous, bulgur  wheat), and pasta (without cream sauces).  IDENTIFYING FOODS THAT LOWER FAT AND CHOLESTEROL  Soluble fiber may lower your cholesterol. This type of fiber is found in fruits such as apples, vegetables such as broccoli, potatoes, and carrots, legumes such as beans, peas, and lentils, and grains such as barley. Foods fortified with plant sterols (phytosterol) may also lower cholesterol. You should eat at least 2 g per day of these foods for a cholesterol lowering effect.  Read package labels to identify low-saturated fats, trans fat free, and low-fat foods at the supermarket. Select cheeses that have only 2 to 3 g saturated fat per ounce. Use a heart-healthy tub margarine that is free of trans fats or partially hydrogenated oil. When buying baked goods (cookies, crackers), avoid partially hydrogenated oils. Breads and muffins should be made from whole grains (whole-wheat or whole oat flour, instead of "flour" or "enriched flour"). Buy non-creamy canned soups with reduced salt and no added fats.  FOOD PREPARATION TECHNIQUES  Never deep-fry. If you must fry, either stir-fry, which uses very little fat, or use non-stick cooking sprays. When possible, broil, bake, or roast meats, and steam vegetables. Instead of putting butter or margarine on vegetables, use lemon and herbs, applesauce, and cinnamon (for squash and sweet potatoes). Use nonfat yogurt, salsa, and low-fat dressings for salads.  LOW-SATURATED FAT / LOW-FAT FOOD SUBSTITUTES Meats / Saturated Fat (g) Avoid: Steak, marbled (3 oz/85 g) / 11 g Choose: Steak, lean (3 oz/85 g) / 4 g Avoid: Hamburger (3 oz/85 g) / 7 g Choose: Hamburger, lean (3 oz/85 g) / 5 g Avoid: Ham (3 oz/85 g) / 6 g Choose: Ham, lean cut (3 oz/85 g) / 2.4 g Avoid: Chicken, with skin, dark meat (3 oz/85 g) / 4 g Choose: Chicken, skin removed, dark meat (3 oz/85 g) / 2 g Avoid: Chicken, with skin, light meat (3 oz/85 g) / 2.5 g Choose: Chicken, skin removed, light meat (3 oz/85 g)  / 1 g Dairy / Saturated Fat (g) Avoid: Whole milk (1 cup) / 5 g Choose: Low-fat milk, 2% (1 cup) / 3 g Choose: Low-fat milk, 1% (1 cup) / 1.5 g Choose: Skim milk (1 cup) / 0.3 g Avoid: Hard cheese (1 oz/28 g) / 6 g Choose: Skim milk cheese (1 oz/28 g) / 2 to 3 g Avoid: Cottage cheese, 4% fat (1 cup) / 6.5 g Choose: Low-fat cottage cheese, 1% fat (1 cup) / 1.5 g Avoid: Ice cream (1 cup) / 9 g Choose: Sherbet (1 cup) / 2.5 g Choose: Nonfat frozen yogurt (1 cup) / 0.3 g Choose: Frozen fruit bar / trace Avoid: Whipped cream (1 tbs) / 3.5 g Choose: Nondairy whipped topping (1 tbs) / 1 g Condiments / Saturated Fat (g) Avoid: Mayonnaise (1 tbs) / 2 g Choose: Low-fat mayonnaise (1 tbs) / 1 g Avoid: Butter (1 tbs) / 7 g Choose: Extra light margarine (1 tbs) / 1 g Avoid: Coconut oil (1 tbs) / 11.8 g Choose: Olive oil (1 tbs) / 1.8 g Choose: Corn oil (1 tbs) / 1.7 g Choose: Safflower oil (1 tbs) / 1.2 g Choose: Sunflower oil (1 tbs) /  1.4 g Choose: Soybean oil (1 tbs) / 2.4 g Choose: Canola oil (1 tbs) / 1 g Document Released: 09/25/2005 Document Revised: 01/20/2013 Document Reviewed: 12/24/2013 [facility] Patient Information 2015 [place], [place]. This information is not intended to replace advice given to you by your health care provider. Make sure you discuss any questions you have with your health care provider.    It may be used if you need help with weight management.  It may be used if you want to add it to your diet as a protective measure against atherosclerosis, diabetes, and cancer. SOURCES OF FIBER  Whole-grain breads and cereals.  Fruits, such as apples, oranges, bananas, berries, prunes, and pears.  Vegetables, such as green peas, carrots, sweet potatoes, beets, broccoli, cabbage, spinach, and artichokes.  Legumes, such split peas, soy, lentils.  Almonds. FIBER CONTENT IN FOODS Starches and Grains / Dietary Fiber (g)  Cheerios, 1 cup / 3 g  Corn Flakes cereal, 1  cup / 0.7 g  Rice crispy treat cereal, 1 cup / 0.3 g  Instant oatmeal (cooked),  cup / 2 g  Frosted wheat cereal, 1 cup / 5.1 g  Brown, long-grain rice (cooked), 1 cup / 3.5 g  White, long-grain rice (cooked), 1 cup / 0.6 g  Enriched macaroni (cooked), 1 cup / 2.5 g Legumes / Dietary Fiber (g)  Baked beans (canned, plain, or vegetarian),  cup / 5.2 g  Kidney beans (canned),  cup / 6.8 g  Pinto beans (cooked),  cup / 5.5 g Breads and Crackers / Dietary Fiber (g)  Plain or honey graham crackers, 2 squares / 0.7 g  Saltine crackers, 3 squares / 0.3 g  Plain, salted pretzels, 10 pieces / 1.8 g  Whole-wheat bread, 1 slice / 1.9 g  White bread, 1 slice / 0.7 g  Raisin bread, 1 slice / 1.2 g  Plain bagel, 3 oz / 2 g  Flour tortilla, 1 oz / 0.9 g  Corn tortilla, 1 small / 1.5 g  Hamburger or hotdog bun, 1 small / 0.9 g Fruits / Dietary Fiber (g)  Apple with skin, 1 medium / 4.4 g  Sweetened applesauce,  cup / 1.5 g  Banana,  medium / 1.5 g  Grapes, 10 grapes / 0.4 g  Orange, 1 small / 2.3 g  Raisin, 1.5 oz / 1.6 g  Melon, 1 cup / 1.4 g Vegetables / Dietary Fiber (g)  Green beans (canned),  cup / 1.3 g  Carrots (cooked),  cup / 2.3 g  Broccoli (cooked),  cup / 2.8 g  Peas (cooked),  cup / 4.4 g  Mashed potatoes,  cup / 1.6 g  Lettuce, 1 cup / 0.5 g  Corn (canned),  cup / 1.6 g  Tomato,  cup / 1.1 g Document Released: 09/25/2005 Document Revised: 03/26/2012 Document Reviewed: 12/28/2011 [employer] Patient Information 2015 [place], [place]. This information is not intended to replace advice given to you by your health care provider. Make sure you discuss any questions you have with your health care provider.

## 2015-01-22 NOTE — Progress Notes (Signed)
Patient ID: Rodney PearRoger Mcclain, male   DOB: 12-Nov-1975, 39 y.o.   MRN: [id number]    HPI:  Rodney Mcclain is a 39 y.o.   male referred by Mcclain, Rodney BeaverEdward M, PA-C for evaluation of left lower quadrant pain.  Rodney Mcclain states that in 2015 he developed lower abdominal pain on the left side and above the bladder. At the time his stools were loose and mushy. He was evaluated by his primary care provider, he had an abdominal and [facility] and was diagnosed with diverticulitis. He was treated with a course of Cipro and Flagyl and felt better. Approximately 3 weeks ago he again began to develop left lower quadrant pain that slowly progressed in intensity. He was evaluated by his primary care provider but opted not to go for a CT scan as the cost was prohibitive. He was sent for CBC and noted to have a white blood count of 11.8, he was empirically treated with a course of Cipro and Flagyl for 10 days. While he was on the antibiotics he felt nauseous every day. His left lower quadrant pain radiated to the suprapubic area. His pain would be relieved with urination or defecation. His stools were very mushy. He finished his antibiotics about a week ago but 3 days ago became very nauseous and vomited he had several bouts of diarrhea. The first bout had bright red blood mixed in the stool. Later that day he had a jet black tarry stool. Since then his stools have been mushy and brown with no further dark stools or bright red blood per rectum. He does report that for the past month he has had mucus with his stools and constantly has the sensation that he needs to move his bowels but nothing comes out. He has no rectal pain, itching, or burning. He denies NSAID use. His appetite has been good and his weight has been stable. There is no family history of colon cancer, colon polyps, or inflammatory bowel disease.   Past Medical History  Diagnosis Date  . Hypertension   . Depression   . Gout   . Anal fissure   .  Anxiety and depression   . Obesity   . Diverticulitis     Past Surgical History  Procedure Laterality Date  . Toe surgery      bilateral for bone spurs  . Hip surgery  1986 & 1991    bilateral pin placement   Family History  Problem Relation Age of Onset  . Arthritis Mother   . Arthritis Father   . Arthritis Maternal Grandfather   . Lung cancer      aunt  . Heart disease Mother   . Stroke Maternal Grandfather   . Hypertension Mother   . Hypertension      all grandparents  . Diabetes Mother   . Diabetes Maternal Grandmother   . Kidney cancer Father   . Colon cancer Neg Hx   . Colon polyps Neg Hx   . Esophageal cancer Neg Hx   . Gallbladder disease Neg Hx    History  Substance Use Topics  . Smoking status: Former Smoker    Quit date: 10/09/2005  . Smokeless tobacco: Never Used  . Alcohol Use: 0.0 oz/week    0 Standard drinks or equivalent per week     Comment: occ.   Current Outpatient Prescriptions  Medication Sig Dispense Refill  . Ascorbic Acid (VITAMIN C) 1000 MG tablet Take 1,000 mg by mouth daily.    .Marland Kitchen  Multiple Vitamin (MULTIVITAMIN PO) Take 2 tablets by mouth.       No current facility-administered medications for this visit.   No Known Allergies   Review of Systems: Gen: Denies any fever, chills, sweats, anorexia, fatigue, weakness, malaise, weight loss, and sleep disorder CV: Denies chest pain, angina, palpitations, syncope, orthopnea, PND, peripheral edema, and claudication. Resp: Denies dyspnea at rest, dyspnea with exercise, cough, sputum, wheezing, coughing up blood, and pleurisy. GI: Denies vomiting blood, jaundice, and fecal incontinence.   Denies dysphagia or odynophagia. GU : Denies urinary burning, blood in urine, urinary frequency, urinary hesitancy, nocturnal urination, and urinary incontinence. MS: Denies joint pain, limitation of movement, and swelling, stiffness, low back pain, extremity pain. Denies muscle weakness, cramps, atrophy.    Derm: Denies rash, itching, dry skin, hives, moles, warts, or unhealing ulcers.  Psych: Denies depression, anxiety, memory loss, suicidal ideation, hallucinations, paranoia, and confusion. Heme: Denies bruising, bleeding, and enlarged lymph nodes. Neuro:  Denies any headaches, dizziness, paresthesias. Endo:  Denies any problems with DM, thyroid, adrenal function  Studies: Abdominal and pelvic CT done at [facility] on 11/13/2013 showed no evidence of bowel obstruction. Normal appendix, wall thickening/inflammatory changes along the sigmoid colon favored to reflect sigmoid diverticulitis or possibly colitis. No drainable fluid collection or abscess. No free air to suggest macroscopic perforation.  LAB RESULTS: CBC on 01/07/2015 had white blood cell 11.6, hemoglobin 15.1, hematocrit 43.6, platelets 193,000.    Physical Exam: BP 134/90 mmHg  Pulse 72  Ht  (1.803 m)  Wt 345 lb (156.491 kg)  BMI 48.14 kg/m2 Constitutional: Pleasant,obese, male in no acute distress. HEENT: Normocephalic and atraumatic. Conjunctivae are normal. No scleral icterus. Neck supple. No cervical adenopathy Cardiovascular: Normal rate, regular rhythm.  Pulmonary/chest: Effort normal and breath sounds normal. No wheezing, rales or rhonchi. Abdominal: Soft, nondistended, tender LLQ and suprapubic area, Bowel sounds active throughout. There are no masses palpable. No hepatomegaly. Rectal: soft brown stool with mucus, heme positive Extremities: no edema Lymphadenopathy: No cervical adenopathy noted. Neurological: Alert and oriented to person place and time. Skin: Skin is warm and dry. No rashes noted. Psychiatric: Normal mood and affect. Behavior is normal.  ASSESSMENT AND PLAN: 39 year old male status post 2 episodes of left lower quadrant pain, treated for diverticulitis 2, with continued left lower quadrant pain, mushy stools with mucus, and tenesmus. Symptoms may be due to some  spastic diverticular disease. He's been advised to adhere to a high-fiber low-fat diet and will be given a trial of Bentyl 10 mg 1-2 capsules by mouth every 8 hours when necessary. He will be scheduled for a colonoscopy to evaluate for polyps, neoplasia or an inflammatory bowel process or segmental colitis in the area of diverticular disease.The risks, benefits, and alternatives to colonoscopy with possible biopsy and possible polypectomy were discussed with the patient and they consent to proceed. The procedure will be scheduled with Dr. Leone Payor. Further recommendations will be made pending the findings of his colonoscopy.    Esaw Knippel, Moise Boring 01/22/2015, 9:32 AM  CC: Mcclain, Rodney Beaver, PA-C

## 2015-01-27 NOTE — Progress Notes (Signed)
Agree w/ Ms. Hvozdovic's note and mangement.  

## 2015-02-02 ENCOUNTER — Encounter: Payer: Self-pay | Admitting: Internal Medicine

## 2015-02-02 ENCOUNTER — Ambulatory Visit (AMBULATORY_SURGERY_CENTER): Payer: Managed Care, Other (non HMO) | Admitting: Internal Medicine

## 2015-02-02 VITALS — BP 124/73 | HR 55 | Temp 97.5°F | Resp 26 | Ht 71.0 in | Wt 345.0 lb

## 2015-02-02 DIAGNOSIS — K921 Melena: Secondary | ICD-10-CM

## 2015-02-02 DIAGNOSIS — R1032 Left lower quadrant pain: Secondary | ICD-10-CM | POA: Diagnosis not present

## 2015-02-02 DIAGNOSIS — K5730 Diverticulosis of large intestine without perforation or abscess without bleeding: Secondary | ICD-10-CM

## 2015-02-02 DIAGNOSIS — K529 Noninfective gastroenteritis and colitis, unspecified: Secondary | ICD-10-CM | POA: Diagnosis not present

## 2015-02-02 MED ORDER — SODIUM CHLORIDE 0.9 % IV SOLN: 500.0000 mL | INTRAVENOUS | Status: DC

## 2015-02-02 NOTE — Progress Notes (Signed)
Called to room to assist during endoscopic procedure.  Patient ID and intended procedure confirmed with present staff. Received instructions for my participation in the procedure from the performing physician.  

## 2015-02-02 NOTE — Progress Notes (Signed)
To recovery, report to [place], RN, VSS 

## 2015-02-02 NOTE — Op Note (Signed)
[facility] [address] [place], [id number]   COLONOSCOPY PROCEDURE REPORT  PATIENT: Rodney Mcclain, Rodney Mcclain  MR#: [id number] BIRTHDATE: January 18, 1976 , 39  yrs. old GENDER: male ENDOSCOPIST: Iva Booparl E Saman Giddens, MD, [facility] PROCEDURE DATE:  02/02/2015 PROCEDURE:   Colonoscopy, diagnostic and Colonoscopy with biopsy First Screening Colonoscopy - Avg.  risk and is 50 yrs.  old or older - No.  Prior Negative Screening - Now for repeat screening. N/A  History of Adenoma - Now for follow-up colonoscopy & has been > or = to 3 yrs.  N/A ASA CLASS:   Class III INDICATIONS:Evaluation of unexplained GI bleeding and Patient is not applicable for Colorectal Neoplasm Risk Assessment for this procedure. MEDICATIONS: Propofol 400 mg IV and Monitored anesthesia care  DESCRIPTION OF PROCEDURE:   After the risks benefits and alternatives of the procedure were thoroughly explained, informed consent was obtained.  The digital rectal exam revealed no abnormalities of the rectum, revealed no prostatic nodules, and revealed the prostate was not enlarged.   The LB [id number] endoscope was introduced through the anus and advanced to the cecum, which was identified by both the appendix and ileocecal valve. No adverse events experienced.   The quality of the prep was excellent.  (MiraLax was used)  The instrument was then slowly withdrawn as the colon was fully examined.      COLON FINDINGS: There was moderate diverticulosis noted in the sigmoid colon.   An area of hemorrhagic mucosa was found in the sigmoid colon. A few intensely erythematous patches.   Multiple biopsies were performed using cold forceps.   The examination was otherwise normal.  Retroflexed views revealed no abnormalities. The time to cecum = 2.5 Withdrawal time = 12.9   The scope was withdrawn and the procedure completed. COMPLICATIONS: There were no immediate complications.  ENDOSCOPIC IMPRESSION: 1.   Moderate  diverticulosis was noted in the sigmoid colon 2.   An area of hemorrhagic mucosa was found in the sigmoid colon; multiple biopsies were performed using cold forceps - ? focal colitis vs. residual changes of recent diverticulitis 3.   The examination was otherwise normal - excellent prep  RECOMMENDATIONS: Timing of repeat colonoscopy will be determined by pathology findings. He has had recent diverticulitis and hematochezia.  eSigned:  Iva Booparl E Jaina Morin, MD, [facility] 02/02/2015 2:35 PM   cc: Lezlie OctaveKate Tabori, MD and The Patient

## 2015-02-02 NOTE — Patient Instructions (Addendum)
I found some red areas in the colon - could be inflammation. I took biopsies to understand better. I also saw diverticulosis. No polyps/cancer. We will let you know biopsy results and plans.  I appreciate the opportunity to care for you. Iva Boop, MD, FACG  YOU HAD AN ENDOSCOPIC PROCEDURE TODAY AT THE [facility]:   Refer to the procedure report that was given to you for any specific questions about what was found during the examination.  If the procedure report does not answer your questions, please call your gastroenterologist to clarify.  If you requested that your care partner not be given the details of your procedure findings, then the procedure report has been included in a sealed envelope for you to review at your convenience later.  YOU SHOULD EXPECT: Some feelings of bloating in the abdomen. Passage of more gas than usual.  Walking can help get rid of the air that was put into your GI tract during the procedure and reduce the bloating. If you had a lower endoscopy (such as a colonoscopy or flexible sigmoidoscopy) you may notice spotting of blood in your stool or on the toilet paper. If you underwent a bowel prep for your procedure, you may not have a normal bowel movement for a few days.  Please Note:  You might notice some irritation and congestion in your nose or some drainage.  This is from the oxygen used during your procedure.  There is no need for concern and it should clear up in a day or so.  SYMPTOMS TO REPORT IMMEDIATELY:   Following lower endoscopy (colonoscopy or flexible sigmoidoscopy):  Excessive amounts of blood in the stool  Significant tenderness or worsening of abdominal pains  Swelling of the abdomen that is new, acute  Fever of 100F or higher  For urgent or emergent issues, a gastroenterologist can be reached at any hour by calling [phone].  DIET: Your first meal following the procedure should be a small meal and then it is ok  to progress to your normal diet. Heavy or fried foods are harder to digest and may make you feel nauseous or bloated.  Likewise, meals heavy in dairy and vegetables can increase bloating.  Drink plenty of fluids but you should avoid alcoholic beverages for 24 hours.  ACTIVITY:  You should plan to take it easy for the rest of today and you should NOT DRIVE or use heavy machinery until tomorrow (because of the sedation medicines used during the test).    FOLLOW UP: Our staff will call the number listed on your records the next business day following your procedure to check on you and address any questions or concerns that you may have regarding the information given to you following your procedure. If we do not reach you, we will leave a message.  However, if you are feeling well and you are not experiencing any problems, there is no need to return our call.  We will assume that you have returned to your regular daily activities without incident.  If any biopsies were taken you will be contacted by phone or by letter within the next 1-3 weeks.  Please call us at [phone] if you have not heard about the biopsies in 3 weeks.   SIGNATURES/CONFIDENTIALITY: You and/or your care partner have signed paperwork which will be entered into your electronic medical record.  These signatures attest to the fact that that the information above on your After Visit Summary has  been reviewed and is understood.  Full responsibility of the confidentiality of this discharge information lies with you and/or your care-partner.  Continue your normal medications  Please read over handout about diverticulosis

## 2015-02-03 ENCOUNTER — Telehealth: Payer: Self-pay | Admitting: *Deleted

## 2015-02-03 NOTE — Telephone Encounter (Signed)
  Follow up Call-  Call back number 02/02/2015  Post procedure Call Back phone  # [phone]  Permission to leave phone message Yes     Patient questions:  Do you have a fever, pain , or abdominal swelling? No. Pain Score  0 *  Have you tolerated food without any problems? Yes.    Have you been able to return to your normal activities? Yes.    Do you have any questions about your discharge instructions: Diet   No. Medications  No. Follow up visit  No.  Do you have questions or concerns about your Care? No.  Actions: * If pain score is 4 or above: No action needed, pain <4.

## 2015-02-08 ENCOUNTER — Encounter: Payer: Self-pay | Admitting: Internal Medicine

## 2015-02-08 NOTE — Progress Notes (Signed)
Quick Note:  Ischemic changes Repeat colon 01/2024 ______

## 2015-04-19 ENCOUNTER — Ambulatory Visit (INDEPENDENT_AMBULATORY_CARE_PROVIDER_SITE_OTHER): Payer: Managed Care, Other (non HMO) | Admitting: Medical

## 2015-04-19 ENCOUNTER — Encounter: Payer: Self-pay | Admitting: Medical

## 2015-04-19 VITALS — BP 130/85 | HR 68 | Temp 98.5°F | Ht 71.0 in | Wt 339.0 lb

## 2015-04-19 DIAGNOSIS — T7840XA Allergy, unspecified, initial encounter: Secondary | ICD-10-CM | POA: Diagnosis not present

## 2015-04-19 DIAGNOSIS — H6981 Other specified disorders of Eustachian tube, right ear: Secondary | ICD-10-CM | POA: Diagnosis not present

## 2015-04-19 DIAGNOSIS — H6501 Acute serous otitis media, right ear: Secondary | ICD-10-CM

## 2015-04-19 MED ORDER — AZITHROMYCIN 250 MG PO TABS: ORAL_TABLET | ORAL | Status: DC

## 2015-04-19 MED ORDER — FLUTICASONE PROPIONATE 50 MCG/ACT NA SUSP: 2.0000 | Freq: Every day | NASAL | Status: DC

## 2015-04-19 MED ORDER — TRIAMCINOLONE ACETONIDE 0.1 % EX CREA: 1.0000 "application " | TOPICAL_CREAM | Freq: Two times a day (BID) | CUTANEOUS | Status: DC

## 2015-04-19 NOTE — Progress Notes (Signed)
Pre visit review using our clinic review tool, if applicable. No additional management support is needed unless otherwise documented below in the visit note. 

## 2015-04-19 NOTE — Patient Instructions (Signed)
Appear to have early rt om with likely eustachian tube dysfunction. Rx flonase nasal spray and azithromycin.  For your residual lt arm allergic reaction. Rx triamcinolone cream.  Your bp appears normal after rechecking. Would continue to follow in future.  Follow up in 7 days any persisting symptoms or as needed.

## 2015-04-19 NOTE — Progress Notes (Signed)
Subjective:    Patient ID: Rodney Mcclain, male    DOB: 1976/05/09, 39 y.o.   MRN: [id number]  HPI  Pt in with rt ear fullness since Thursday. Not getting better. Ringing to ear and muffled sound. No nasal congestion or sinus pressure. No fever or chills. Certain noises will cause brief transient pain.  Pt in with some lt arm rash. He was cutting down cedar tree. Only faint residual rash now. He used otc wrap  remedy pharmacist advised  Pt initial bp was high. When I checked came down. No cardio or neurologic signs or symptoms.     Review of Systems  Constitutional: Negative for fever, chills and fatigue.  HENT: Positive for ear pain. Negative for postnasal drip, rhinorrhea, sinus pressure, sneezing and sore throat.        See hpi for details.  Respiratory: Negative for apnea, cough, chest tightness and wheezing.   Cardiovascular: Negative for chest pain and palpitations.  Skin: Positive for rash.  Neurological: Negative for dizziness, seizures, speech difficulty, weakness, light-headedness and headaches.  Hematological: Negative for adenopathy. Does not bruise/bleed easily.     Past Medical History  Diagnosis Date  . Hypertension   . Depression   . Gout   . Anal fissure   . Anxiety and depression   . Obesity   . Diverticulitis     History   Social History  . Marital Status: Married    Spouse Name: N/A  . Number of Children: 1  . Years of Education: N/A   Occupational History  . Purchasing/sourcing manager    Social History Main Topics  . Smoking status: Former Smoker    Quit date: 10/09/2005  . Smokeless tobacco: Never Used  . Alcohol Use: 0.0 oz/week    0 Standard drinks or equivalent per week     Comment: occ.  . Drug Use: No  . Sexual Activity: Not on file   Other Topics Concern  . Not on file   Social History Narrative    Past Surgical History  Procedure Laterality Date  . Toe surgery      bilateral for bone spurs  . Hip surgery  1986 & 1991    bilateral pin placement    Family History  Problem Relation Age of Onset  . Arthritis Mother   . Arthritis Father   . Arthritis Maternal Grandfather   . Lung cancer      aunt  . Heart disease Mother   . Stroke Maternal Grandfather   . Hypertension Mother   . Hypertension      all grandparents  . Diabetes Mother   . Diabetes Maternal Grandmother   . Kidney cancer Father   . Colon cancer Neg Hx   . Colon polyps Neg Hx   . Esophageal cancer Neg Hx   . Gallbladder disease Neg Hx     No Known Allergies  Current Outpatient Prescriptions on File Prior to Visit  Medication Sig Dispense Refill  . Ascorbic Acid (VITAMIN C) 1000 MG tablet Take 1,000 mg by mouth daily.    . Multiple Vitamin (MULTIVITAMIN PO) Take 2 tablets by mouth.      . dicyclomine (BENTYL) 10 MG capsule Take 1-2 capsules (10-20 mg total) by mouth every 8 (eight) hours. (Patient not taking: Reported on 02/02/2015) 90 capsule 1   No current facility-administered medications on file prior to visit.    BP 130/85 mmHg  Pulse 68  Temp(Src) 98.5 F (36.9 C) (Oral)  Ht   (1.803 m)  Wt 339 lb (153.769 kg)  BMI 47.30 kg/m2  SpO2 99%       Objective:   Physical Exam  General  Mental Status - Alert. General Appearance - Well groomed. Not in acute distress.  Skin Rashes- No Rashes.  HEENT Head- Normal. Ear Auditory Canal - Left- Normal. Right - Normal.Tympanic Membrane- Left- Normal. Right- upper 1/3 moderate bright red tm.  Eye Sclera/Conjunctiva- Left- Normal. Right- Normal. Nose & Sinuses Nasal Mucosa- Left-  Not boggy or Congested. Right-  Not  boggy or Congested. Mouth & Throat Lips: Upper Lip- Normal: no dryness, cracking, pallor, cyanosis, or vesicular eruption. Lower Lip-Normal: no dryness, cracking, pallor, cyanosis or vesicular eruption. Buccal Mucosa- Bilateral- No Aphthous ulcers. Oropharynx- No Discharge or Erythema. Tonsils: Characteristics- Bilateral- No Erythema or Congestion.  Size/Enlargement- Bilateral- No enlargement. Discharge- bilateral-None.  Neck Neck- Supple. No Masses.   Chest and Lung Exam Auscultation: Breath Sounds:- even and unlabored  Cardiovascular Auscultation:Rythm- Regular, rate and rhythm. Murmurs & Other Heart Sounds:Ausculatation of the heart reveal- No Murmurs.  Lymphatic Head & Neck General Head & Neck Lymphatics: Bilateral: Description- No Localized lymphadenopathy.  Skin- faint scattered rash on pt left arm(much better than a week ago)   Neurologic Cranial Nerve exam:- CN III-XII intact(No nystagmus), symmetric smile. Grossly intact.        Assessment & Plan:  Appear to have early rt om with likely eustachian tube dysfunction. Rx flonase nasal spray and azithromycin.  For your residual lt arm allergic reaction. Rx triamcinolone cream.  Your bp appears normal after rechecking. Would continue to follow in future.  Follow up in 7 days any persisting symptoms or as needed.

## 2015-04-28 ENCOUNTER — Telehealth: Payer: Self-pay | Admitting: Family Medicine

## 2015-04-28 NOTE — Telephone Encounter (Signed)
Relation to pt: self  Call back number: work # [phone]   Reason for call:  Patient states symptoms have not improved patient states no ringing but patient feels dizzy and nausea. Please advise

## 2015-04-28 NOTE — Telephone Encounter (Signed)
Will advise pt to come in for appointment to recheck his ear. My note does not indicate he had dizziness.

## 2015-04-29 ENCOUNTER — Encounter: Payer: Self-pay | Admitting: Medical

## 2015-04-29 ENCOUNTER — Ambulatory Visit (INDEPENDENT_AMBULATORY_CARE_PROVIDER_SITE_OTHER): Payer: Managed Care, Other (non HMO) | Admitting: Medical

## 2015-04-29 VITALS — BP 134/90 | HR 62 | Temp 98.8°F | Ht 71.0 in | Wt 340.0 lb

## 2015-04-29 DIAGNOSIS — H8301 Labyrinthitis, right ear: Secondary | ICD-10-CM

## 2015-04-29 DIAGNOSIS — H6691 Otitis media, unspecified, right ear: Secondary | ICD-10-CM

## 2015-04-29 MED ORDER — AMOXICILLIN-POT CLAVULANATE 875-125 MG PO TABS: 1.0000 | ORAL_TABLET | Freq: Two times a day (BID) | ORAL | Status: DC

## 2015-04-29 MED ORDER — MECLIZINE HCL 12.5 MG PO TABS: 12.5000 mg | ORAL_TABLET | Freq: Three times a day (TID) | ORAL | Status: DC | PRN

## 2015-04-29 NOTE — Progress Notes (Signed)
Pre visit review using our clinic review tool, if applicable. No additional management support is needed unless otherwise documented below in the visit note. 

## 2015-04-29 NOTE — Progress Notes (Signed)
Subjective:    Patient ID: Rodney Mcclain, male    DOB: [birth year], 39 y.o.   MRN: [id number]  HPI    Pt in states next morning after I saw him he felt dizzy. Almost drunk like. He felt sweating. Then he vomited after that. He layed down briefly got up  and felt fine rest of the day.  Then this past Monday 18 th felt very dizziness for about one hour.   Then yesterday dizziness came on again for about an hour. Then stopped.  Not associated neurologic type signs or symptoms during these episodes  Pt was on zpack for what appears to be very early om with eustachian tube dysfunction  No recent ear pain or pressure. Treated last visit early OM and eustachian tube dysfunction.       Review of Systems  Constitutional: Negative for fever, chills and fatigue.  HENT: Negative for congestion, ear pain, nosebleeds, postnasal drip, rhinorrhea, sinus pressure and sore throat.   Respiratory: Negative for cough, chest tightness, shortness of breath and wheezing.   Cardiovascular: Negative for chest pain and palpitations.  Musculoskeletal: Negative for back pain.  Neurological: Positive for dizziness. Negative for syncope, facial asymmetry, speech difficulty, weakness, numbness and headaches.  Hematological: Negative for adenopathy. Does not bruise/bleed easily.  Psychiatric/Behavioral: Negative for behavioral problems and confusion.   Past Medical History  Diagnosis Date  . Hypertension   . Depression   . Gout   . Anal fissure   . Anxiety and depression   . Obesity   . Diverticulitis     History   Social History  . Marital Status: Married    Spouse Name: N/A  . Number of Children: 1  . Years of Education: N/A   Occupational History  . Purchasing/sourcing manager    Social History Main Topics  . Smoking status: Former Smoker    Quit date: 10/09/2005  . Smokeless tobacco: Never Used  . Alcohol Use: 0.0 oz/week    0 Standard drinks or equivalent per week     Comment: occ.   . Drug Use: No  . Sexual Activity: Not on file   Other Topics Concern  . Not on file   Social History Narrative    Past Surgical History  Procedure Laterality Date  . Toe surgery      bilateral for bone spurs  . Hip surgery  1986 & 1991    bilateral pin placement    Family History  Problem Relation Age of Onset  . Arthritis Mother   . Arthritis Father   . Arthritis Maternal Grandfather   . Lung cancer      aunt  . Heart disease Mother   . Stroke Maternal Grandfather   . Hypertension Mother   . Hypertension      all grandparents  . Diabetes Mother   . Diabetes Maternal Grandmother   . Kidney cancer Father   . Colon cancer Neg Hx   . Colon polyps Neg Hx   . Esophageal cancer Neg Hx   . Gallbladder disease Neg Hx     No Known Allergies  Current Outpatient Prescriptions on File Prior to Visit  Medication Sig Dispense Refill  . Ascorbic Acid (VITAMIN C) 1000 MG tablet Take 1,000 mg by mouth daily.    . fluticasone (FLONASE) 50 MCG/ACT nasal spray Place 2 sprays into both nostrils daily. 16 g 1  . Multiple Vitamin (MULTIVITAMIN PO) Take 2 tablets by mouth.      Marland Kitchen  triamcinolone cream (KENALOG) 0.1 % Apply 1 application topically 2 (two) times daily. 30 g 0   No current facility-administered medications on file prior to visit.    BP 134/90 mmHg  Pulse 62  Temp(Src) 98.8 F (37.1 C) (Oral)  Ht 5\' 11"  (1.803 m)  Wt 340 lb (154.223 kg)  BMI 47.44 kg/m2  SpO2 99%       Objective:   Physical Exam  General  Mental Status - Alert. General Appearance - Well groomed. Not in acute distress.  Skin Rashes- No Rashes.  HEENT Head- Normal. Ear Auditory Canal - Left- Normal. Right - Normal.Tympanic Membrane- Left- mild central redness. Right- moderate central redness Eye Sclera/Conjunctiva- Left- Normal. Right- Normal. Nose & Sinuses Nasal Mucosa- Left-  Mild boggy Right-  Mild   Boggy. Mouth & Throat Lips: Upper Lip- Normal: no dryness, cracking, pallor,  cyanosis, or vesicular eruption. Lower Lip-Normal: no dryness, cracking, pallor, cyanosis or vesicular eruption. Buccal Mucosa- Bilateral- No Aphthous ulcers. Oropharynx- No Discharge or Erythema. Tonsils: Characteristics- Bilateral- No Erythema or Congestion. Size/Enlargement- Bilateral- No enlargement. Discharge- bilateral-None.  Neck Neck- Supple. No Masses.   Chest and Lung Exam Auscultation: Breath Sounds:- even and unlabored.  Cardiovascular Auscultation:Rythm- Regular, rate and rhythm. Murmurs & Other Heart Sounds:Ausculatation of the heart reveal- No Murmurs.  Lymphatic Head & Neck General Head & Neck Lymphatics: Bilateral: Description- No Localized lymphadenopathy.    Neurologic Cranial Nerve exam:- CN III-XII intact(No nystagmus), symmetric smile. EOM intact Strength:- 5/5 equal and symmetric strength both upper and lower extremities. Supine to sitting no dizziness.       Assessment & Plan:  By exam and recent hx pesristing OM with likely labyrinthitis.  Rx augmentin antibiotic. Continue flonase. Rx meclizine. Use every 8 hour next 2 days. Then as needed every  8 hours for dizziness lasting for more than 5 minutes.  If neurologic associated symptoms/signs with dizziness then ED evaluation.  Update me on if re occuring events.  Follow up in 10 days or as needed. If dizziness persisting by 10 days then refer to ENT.

## 2015-04-29 NOTE — Telephone Encounter (Signed)
Paient scheduled for today

## 2015-04-29 NOTE — Patient Instructions (Signed)
By exam and recent hx pesristing OM with likely labyrinthitis.  Rx augmentin antibiotic. Continue flonase. Rx meclizine. Use every 8 hour next 2 days. Then as needed every  8 hours for dizziness lasting for more than 5 minutes.  If neurologic associated symptoms/signs with dizziness then ED evaluation.  Update me on if re occuring events.  Follow up in 10 days or as needed. If dizziness persisting by 10 days then refer to ENT.

## 2015-05-04 ENCOUNTER — Telehealth: Payer: Self-pay | Admitting: Medical

## 2015-05-04 DIAGNOSIS — R42 Dizziness and giddiness: Secondary | ICD-10-CM

## 2015-05-04 DIAGNOSIS — H6501 Acute serous otitis media, right ear: Secondary | ICD-10-CM

## 2015-05-04 DIAGNOSIS — H9191 Unspecified hearing loss, right ear: Secondary | ICD-10-CM

## 2015-05-04 NOTE — Telephone Encounter (Signed)
Caller name: Mayan Kloepfer Relationship to patient: self Can be reached: [phone]  Reason for call: Pt states Ramon Dredge told him to call if further trouble with ears. Pt states he is on 2nd round of antibiotics and the muffled hearing is starting again. He said he was advised to call and may be referred to ENT.

## 2015-05-05 NOTE — Telephone Encounter (Signed)
Refer to ent 

## 2015-05-06 NOTE — Telephone Encounter (Signed)
Left message for patient to call back regarding dizziness.

## 2015-05-06 NOTE — Telephone Encounter (Signed)
Patient states he has not had dizziness in pat 3 days.

## 2015-06-22 ENCOUNTER — Other Ambulatory Visit: Payer: Self-pay | Admitting: Medical

## 2015-06-22 DIAGNOSIS — H903 Sensorineural hearing loss, bilateral: Secondary | ICD-10-CM

## 2015-06-22 DIAGNOSIS — H905 Unspecified sensorineural hearing loss: Secondary | ICD-10-CM

## 2015-07-04 ENCOUNTER — Ambulatory Visit
Admission: RE | Admit: 2015-07-04 | Discharge: 2015-07-04 | Disposition: A | Discharge status: Home / Self Care | Payer: Managed Care, Other (non HMO) | Source: Ambulatory Visit | Attending: Medical | Admitting: Medical

## 2015-07-04 DIAGNOSIS — H905 Unspecified sensorineural hearing loss: Secondary | ICD-10-CM

## 2015-07-04 DIAGNOSIS — H903 Sensorineural hearing loss, bilateral: Secondary | ICD-10-CM

## 2015-11-01 ENCOUNTER — Encounter: Payer: Self-pay | Admitting: Family Medicine

## 2015-11-01 ENCOUNTER — Ambulatory Visit (INDEPENDENT_AMBULATORY_CARE_PROVIDER_SITE_OTHER): Payer: Managed Care, Other (non HMO) | Admitting: Family Medicine

## 2015-11-01 VITALS — BP 139/96 | HR 60 | Temp 98.0°F | Ht 71.0 in | Wt 345.6 lb

## 2015-11-01 DIAGNOSIS — M25561 Pain in right knee: Secondary | ICD-10-CM | POA: Diagnosis not present

## 2015-11-01 MED ORDER — MELOXICAM 15 MG PO TABS: 15.0000 mg | ORAL_TABLET | Freq: Every day | ORAL | Status: DC

## 2015-11-01 NOTE — Progress Notes (Signed)
   Subjective:    Patient ID: Rodney Mcclain, male    DOB: Jul 29, 1976, 40 y.o.   MRN: [id number]  HPI Knee pain- R knee, sxs started last week.  Pt started exercising the last week of December.  Pain is anterior, lateral joint line.  No pain w/ flexion or extension unless lying on side.  Pain w/ weight bearing.  No trouble w/ stairs.  No swelling, redness, no warmth.  No improvement w/  ibuprofen.  Pt reports relief w/ topical patch from [place].  Pain was awful on Saturday but somewhat improved today.   Review of Systems For ROS see HPI     Objective:   Physical Exam  Constitutional: He is oriented to person, place, and time. He appears well-developed and well-nourished. No distress.  Morbidly obese  HENT:  Head: Normocephalic and atraumatic.  Cardiovascular: Intact distal pulses.   Musculoskeletal: He exhibits no edema or tenderness.  Mild crepitus w/ flexion/extension of R knee No TTP over medial or lateral joint line No TTP over patella or distal quad  Neurological: He is alert and oriented to person, place, and time. Coordination normal.  Vitals reviewed.         Assessment & Plan:

## 2015-11-01 NOTE — Assessment & Plan Note (Signed)
New.  No TTP on PE and pain is not reproducible.  Switch to scheduled NSAIDs.  Encouraged continued activity.  If no improvement in 1-2 weeks- will refer to ortho for complete evaluation and tx.  Reviewed supportive care and red flags that should prompt return.  Pt expressed understanding and is in agreement w/ plan.

## 2015-11-01 NOTE — Progress Notes (Signed)
Pre visit review using our clinic review tool, if applicable. No additional management support is needed unless otherwise documented below in the visit note. 

## 2015-11-01 NOTE — Patient Instructions (Signed)
Follow up by phone or MyChart in 10-14 days Start the Mobic once daily- do not take any additional anti-inflammatories or ibuprofen (tylenol is ok) ICE!!!! Try and remain active but don't overdue If no improvement in the next week or so- let me know so we can proceed with a workup Call with any questions or concerns Hang in there!!!

## 2015-11-17 ENCOUNTER — Telehealth: Payer: Self-pay | Admitting: Family Medicine

## 2015-11-17 NOTE — Telephone Encounter (Signed)
LVM inquiring if patient received flu shot  °

## 2017-03-29 ENCOUNTER — Encounter: Payer: Self-pay | Admitting: Family Medicine

## 2017-03-29 ENCOUNTER — Ambulatory Visit (INDEPENDENT_AMBULATORY_CARE_PROVIDER_SITE_OTHER): Payer: Managed Care, Other (non HMO) | Admitting: Family Medicine

## 2017-03-29 VITALS — BP 131/82 | HR 87 | Temp 98.7°F | Resp 16 | Ht 71.0 in | Wt 351.1 lb

## 2017-03-29 DIAGNOSIS — R42 Dizziness and giddiness: Secondary | ICD-10-CM | POA: Diagnosis not present

## 2017-03-29 LAB — LIPID PANEL
CHOL/HDL RATIO: 4
Cholesterol: 206 mg/dL — ABNORMAL HIGH (ref 0–200)
HDL: 53.2 mg/dL (ref >39.00)
LDL Cholesterol: 130 mg/dL — ABNORMAL HIGH (ref 0–99)
NonHDL: 153.06
Triglycerides: 117 mg/dL (ref 0.0–149.0)
VLDL: 23.4 mg/dL (ref 0.0–40.0)

## 2017-03-29 LAB — CBC WITH DIFFERENTIAL/PLATELET
Basophils Absolute: 0.1 10*3/uL (ref 0.0–0.1)
Basophils Relative: 1.3 % (ref 0.0–3.0)
EOS ABS: 0.2 10*3/uL (ref 0.0–0.7)
Eosinophils Relative: 3.8 % (ref 0.0–5.0)
HEMATOCRIT: 46.2 % (ref 39.0–52.0)
HEMOGLOBIN: 15.9 g/dL (ref 13.0–17.0)
LYMPHS PCT: 30.4 % (ref 12.0–46.0)
Lymphs Abs: 1.4 10*3/uL (ref 0.7–4.0)
MCHC: 34.5 g/dL (ref 30.0–36.0)
MCV: 95.1 fl (ref 78.0–100.0)
MONO ABS: 0.4 10*3/uL (ref 0.1–1.0)
Monocytes Relative: 8.5 % (ref 3.0–12.0)
Neutro Abs: 2.6 10*3/uL (ref 1.4–7.7)
Neutrophils Relative %: 56 % (ref 43.0–77.0)
PLATELETS: 209 10*3/uL (ref 150.0–400.0)
RBC: 4.85 Mil/uL (ref 4.22–5.81)
RDW: 13.3 % (ref 11.5–15.5)
WBC: 4.7 10*3/uL (ref 4.0–10.5)

## 2017-03-29 LAB — HEPATIC FUNCTION PANEL
ALK PHOS: 58 U/L (ref 39–117)
ALT: 14 U/L (ref 0–53)
AST: 15 U/L (ref 0–37)
Albumin: 4.3 g/dL (ref 3.5–5.2)
BILIRUBIN DIRECT: 0.1 mg/dL (ref 0.0–0.3)
Total Bilirubin: 0.7 mg/dL (ref 0.2–1.2)
Total Protein: 6.7 g/dL (ref 6.0–8.3)

## 2017-03-29 LAB — BASIC METABOLIC PANEL
BUN: 15 mg/dL (ref 6–23)
CALCIUM: 9.5 mg/dL (ref 8.4–10.5)
CO2: 32 mEq/L (ref 19–32)
Chloride: 103 mEq/L (ref 96–112)
Creatinine, Ser: 1.11 mg/dL (ref 0.40–1.50)
GFR: 76.78 mL/min (ref >60.00)
Glucose, Bld: 101 mg/dL — ABNORMAL HIGH (ref 70–99)
Potassium: 4.7 mEq/L (ref 3.5–5.1)
SODIUM: 140 meq/L (ref 135–145)

## 2017-03-29 LAB — HEMOGLOBIN A1C: Hgb A1c MFr Bld: 5.4 % (ref 4.6–6.5)

## 2017-03-29 LAB — TSH: TSH: 9.18 u[IU]/mL — AB (ref 0.35–4.50)

## 2017-03-29 MED ORDER — ONDANSETRON HCL 4 MG PO TABS: 4.0000 mg | ORAL_TABLET | Freq: Three times a day (TID) | ORAL | 0 refills | Status: DC | PRN

## 2017-03-29 MED ORDER — MECLIZINE HCL 25 MG PO TABS: 25.0000 mg | ORAL_TABLET | Freq: Three times a day (TID) | ORAL | 0 refills | Status: DC | PRN

## 2017-03-29 MED ORDER — FLUTICASONE PROPIONATE 50 MCG/ACT NA SUSP: 2.0000 | Freq: Every day | NASAL | 6 refills | Status: DC

## 2017-03-29 NOTE — Progress Notes (Signed)
Pre visit review using our clinic review tool, if applicable. No additional management support is needed unless otherwise documented below in the visit note. 

## 2017-03-29 NOTE — Assessment & Plan Note (Signed)
Deteriorated.  Pt continues to gain weight.  Stressed need for healthy diet and regular exercise.  Check labs to risk stratify.  Will follow. 

## 2017-03-29 NOTE — Patient Instructions (Addendum)
Schedule your complete physical in 6 months We'll notify you of your lab results and make any changes if needed Continue to work on healthy diet and regular exercise- you can do it! Take the Meclizine as needed for dizziness Use the Zofran as needed for nausea Do the Eppley Maneuver as directed to help de-sensitize your inner ear canal Call with any questions or concerns Have a great summer!!!

## 2017-03-29 NOTE — Assessment & Plan Note (Signed)
New.  Pt's sxs are consistent w/ BPPV.  Asymptomatic today.  Start Meclizine and Zofran prn.  Pt instructed on modified Eppley maneuver.  Reviewed supportive care and red flags that should prompt return.  Pt expressed understanding and is in agreement w/ plan.

## 2017-03-29 NOTE — Progress Notes (Signed)
   Subjective:    Patient ID: Rodney PearRoger Mcclain, male    DOB: [birth year], 41 y.o.   MRN: [id number]  HPI Dizziness- pt reports 2 episodes within last 4 weeks.  Pt reports spinning, nausea, inability to walk.  + vomiting.  Pt reports he typically wakes w/ symptoms but on Monday it occurred at work when he looked away from computer and then looked back.  Some discomfort of R ear.  Denies sinus pressure.  Pt reports good water intake.  Hx of similar.  Obesity- pt has gained 5 lbs since last visit.  BMI is 48.97  Very little exercise.  No CP, SOB, HAs, visual changes, edema.   Review of Systems For ROS see HPI     Objective:   Physical Exam  Constitutional: He is oriented to person, place, and time. He appears well-developed and well-nourished. No distress.  Morbidly obese  HENT:  Head: Normocephalic and atraumatic.  R TM retraction L TM WNL  Eyes: Conjunctivae and EOM are normal. Pupils are equal, round, and reactive to light.  Neck: Normal range of motion. Neck supple. No thyromegaly present.  Cardiovascular: Normal rate, regular rhythm, normal heart sounds and intact distal pulses.   No murmur heard. Pulmonary/Chest: Effort normal and breath sounds normal. No respiratory distress.  Abdominal: Soft. Bowel sounds are normal. He exhibits no distension.  Musculoskeletal: He exhibits no edema.  Lymphadenopathy:    He has no cervical adenopathy.  Neurological: He is alert and oriented to person, place, and time. No cranial nerve deficit. Coordination normal.  Skin: Skin is warm and dry.  Psychiatric: He has a normal mood and affect. His behavior is normal.  Vitals reviewed.         Assessment & Plan:

## 2017-03-30 ENCOUNTER — Other Ambulatory Visit: Payer: Self-pay | Admitting: General Practice

## 2017-03-30 DIAGNOSIS — R7989 Other specified abnormal findings of blood chemistry: Secondary | ICD-10-CM

## 2017-03-30 MED ORDER — LEVOTHYROXINE SODIUM 100 MCG PO TABS: 100.0000 ug | ORAL_TABLET | Freq: Every day | ORAL | 3 refills | Status: DC

## 2017-03-30 NOTE — Progress Notes (Signed)
Called pt and LMOVM to return call.  °

## 2017-03-30 NOTE — Progress Notes (Signed)
Called pt and lmovm to return call.

## 2017-04-04 ENCOUNTER — Encounter: Payer: Self-pay | Admitting: Family Medicine

## 2017-05-09 ENCOUNTER — Other Ambulatory Visit (INDEPENDENT_AMBULATORY_CARE_PROVIDER_SITE_OTHER): Payer: Managed Care, Other (non HMO)

## 2017-05-09 ENCOUNTER — Encounter: Payer: Self-pay | Admitting: General Practice

## 2017-05-09 ENCOUNTER — Encounter: Payer: Self-pay | Admitting: Family Medicine

## 2017-05-09 DIAGNOSIS — R946 Abnormal results of thyroid function studies: Secondary | ICD-10-CM

## 2017-05-09 DIAGNOSIS — R7989 Other specified abnormal findings of blood chemistry: Secondary | ICD-10-CM

## 2017-05-09 LAB — TSH: TSH: 2.53 u[IU]/mL (ref 0.35–4.50)

## 2017-05-09 MED ORDER — LEVOTHYROXINE SODIUM 100 MCG PO TABS: 100.0000 ug | ORAL_TABLET | Freq: Every day | ORAL | 1 refills | Status: DC

## 2017-10-10 ENCOUNTER — Ambulatory Visit (INDEPENDENT_AMBULATORY_CARE_PROVIDER_SITE_OTHER): Payer: Commercial Managed Care - PPO | Admitting: Family Medicine

## 2017-10-10 ENCOUNTER — Encounter: Payer: Self-pay | Admitting: Family Medicine

## 2017-10-10 ENCOUNTER — Other Ambulatory Visit: Payer: Self-pay

## 2017-10-10 VITALS — BP 130/83 | HR 82 | Temp 98.1°F | Resp 16 | Ht 71.0 in | Wt 325.5 lb

## 2017-10-10 DIAGNOSIS — Z0001 Encounter for general adult medical examination with abnormal findings: Secondary | ICD-10-CM

## 2017-10-10 DIAGNOSIS — R0683 Snoring: Secondary | ICD-10-CM

## 2017-10-10 DIAGNOSIS — Z23 Encounter for immunization: Secondary | ICD-10-CM | POA: Diagnosis not present

## 2017-10-10 DIAGNOSIS — Z0000 Encounter for general adult medical examination without abnormal findings: Secondary | ICD-10-CM

## 2017-10-10 LAB — LIPID PANEL
Cholesterol: 234 mg/dL — ABNORMAL HIGH (ref 0–200)
HDL: 64.5 mg/dL (ref >39.00)
LDL Cholesterol: 138 mg/dL — ABNORMAL HIGH (ref 0–99)
NonHDL: 169.42
TRIGLYCERIDES: 156 mg/dL — AB (ref 0.0–149.0)
Total CHOL/HDL Ratio: 4
VLDL: 31.2 mg/dL (ref 0.0–40.0)

## 2017-10-10 LAB — CBC WITH DIFFERENTIAL/PLATELET
BASOS PCT: 1.1 % (ref 0.0–3.0)
Basophils Absolute: 0.1 10*3/uL (ref 0.0–0.1)
EOS PCT: 4.2 % (ref 0.0–5.0)
Eosinophils Absolute: 0.2 10*3/uL (ref 0.0–0.7)
HCT: 47 % (ref 39.0–52.0)
HEMOGLOBIN: 15.9 g/dL (ref 13.0–17.0)
Lymphocytes Relative: 26.1 % (ref 12.0–46.0)
Lymphs Abs: 1.2 10*3/uL (ref 0.7–4.0)
MCHC: 33.8 g/dL (ref 30.0–36.0)
MCV: 96.4 fl (ref 78.0–100.0)
Monocytes Absolute: 0.4 10*3/uL (ref 0.1–1.0)
Monocytes Relative: 8.7 % (ref 3.0–12.0)
Neutro Abs: 2.8 10*3/uL (ref 1.4–7.7)
Neutrophils Relative %: 59.9 % (ref 43.0–77.0)
Platelets: 187 10*3/uL (ref 150.0–400.0)
RBC: 4.88 Mil/uL (ref 4.22–5.81)
RDW: 13.4 % (ref 11.5–15.5)
WBC: 4.7 10*3/uL (ref 4.0–10.5)

## 2017-10-10 LAB — BASIC METABOLIC PANEL
BUN: 15 mg/dL (ref 6–23)
CO2: 29 mEq/L (ref 19–32)
CREATININE: 0.98 mg/dL (ref 0.40–1.50)
Calcium: 9.2 mg/dL (ref 8.4–10.5)
Chloride: 102 mEq/L (ref 96–112)
GFR: 88.42 mL/min (ref >60.00)
Glucose, Bld: 92 mg/dL (ref 70–99)
Potassium: 4.8 mEq/L (ref 3.5–5.1)
Sodium: 138 mEq/L (ref 135–145)

## 2017-10-10 LAB — HEPATIC FUNCTION PANEL
ALBUMIN: 4.3 g/dL (ref 3.5–5.2)
ALT: 39 U/L (ref 0–53)
AST: 38 U/L — AB (ref 0–37)
Alkaline Phosphatase: 63 U/L (ref 39–117)
BILIRUBIN TOTAL: 1 mg/dL (ref 0.2–1.2)
Bilirubin, Direct: 0.1 mg/dL (ref 0.0–0.3)
Total Protein: 7.1 g/dL (ref 6.0–8.3)

## 2017-10-10 LAB — TSH: TSH: 6.73 u[IU]/mL — AB (ref 0.35–4.50)

## 2017-10-10 MED ORDER — INDOMETHACIN 50 MG PO CAPS: ORAL_CAPSULE | ORAL | 0 refills | Status: DC

## 2017-10-10 NOTE — Addendum Note (Signed)
Addended by: Geannie RisenBRODMERKEL, JESSICA L on: 10/10/2017 08:46 AM   Modules accepted: Orders

## 2017-10-10 NOTE — Assessment & Plan Note (Signed)
Ongoing issue for pt.  He has lost another 5 lbs.  Applauded his efforts.  Check labs to risk stratify.  Will follow.

## 2017-10-10 NOTE — Assessment & Plan Note (Signed)
Pt's PE WNL w/ exception of obesity.  Tdap given today.  UTD on flu.  Refer to pulmonary for OSA evaluation.  Check labs.  Anticipatory guidance provided.

## 2017-10-10 NOTE — Patient Instructions (Signed)
Follow up in 6 months to recheck weight loss progress and blood pressure We'll notify you of your lab results and make any changes if needed Continue to work on healthy diet and regular exercise- you can do it! Call with any questions or concerns Congrats on the new job!! Happy New Year!!

## 2017-10-10 NOTE — Progress Notes (Signed)
   Subjective:    Patient ID: Rodney Mcclain, male    DOB: 07/16/1976, 42 y.o.   MRN: [id number]  HPI CPE- due for Tdap today.  UTD on flu shot.  Pt has lost another 5 lbs.   Review of Systems Patient reports no vision/hearing changes, anorexia, fever ,adenopathy, persistant/recurrent hoarseness, swallowing issues, chest pain, palpitations, edema, hemoptysis, dyspnea (rest,exertional, paroxysmal nocturnal), gastrointestinal  bleeding (melena, rectal bleeding), abdominal pain, excessive heart burn, GU symptoms (dysuria, hematuria, voiding/incontinence issues) syncope, focal weakness, memory loss, numbness & tingling, skin/hair/nail changes, depression, anxiety, abnormal bruising/bleeding, musculoskeletal symptoms/signs.   + cough x1 week- PND, cough occurs with shallow breathing, is wet but not productive.  Can occur in 'fits'.    Objective:   Physical Exam General Appearance:    Alert, cooperative, no distress, appears stated age, obese  Head:    Normocephalic, without obvious abnormality, atraumatic  Eyes:    PERRL, conjunctiva/corneas clear, EOM's intact, fundi    benign, both eyes       Ears:    Normal TM's and external ear canals, both ears  Nose:   Nares normal, septum midline, mucosa normal, no drainage   or sinus tenderness  Throat:   Lips, mucosa, and tongue normal; teeth and gums normal  Neck:   Supple, symmetrical, trachea midline, no adenopathy;       thyroid:  No enlargement/tenderness/nodules  Back:     Symmetric, no curvature, ROM normal, no CVA tenderness  Lungs:     Clear to auscultation bilaterally, respirations unlabored  Chest wall:    No tenderness or deformity  Heart:    Regular rate and rhythm, S1 and S2 normal, no murmur, rub   or gallop  Abdomen:     Soft, non-tender, bowel sounds active all four quadrants,    no masses, no organomegaly  Genitalia:    Normal male without lesion, masses,discharge or tenderness  Rectal:    Deferred due to young age  Extremities:    Extremities normal, atraumatic, no cyanosis or edema  Pulses:   2+ and symmetric all extremities  Skin:   Skin color, texture, turgor normal, no rashes or lesions  Lymph nodes:   Cervical, supraclavicular, and axillary nodes normal  Neurologic:   CNII-XII intact. Normal strength, sensation and reflexes      throughout          Assessment & Plan:

## 2017-10-11 ENCOUNTER — Other Ambulatory Visit: Payer: Self-pay | Admitting: General Practice

## 2017-10-11 DIAGNOSIS — E039 Hypothyroidism, unspecified: Secondary | ICD-10-CM

## 2017-10-11 MED ORDER — LEVOTHYROXINE SODIUM 112 MCG PO TABS: 112.0000 ug | ORAL_TABLET | Freq: Every day | ORAL | 3 refills | Status: DC

## 2017-11-07 ENCOUNTER — Encounter: Payer: Self-pay | Admitting: Family Medicine

## 2017-11-08 MED ORDER — LEVOTHYROXINE SODIUM 112 MCG PO TABS: 112.0000 ug | ORAL_TABLET | Freq: Every day | ORAL | 3 refills | Status: DC

## 2017-11-12 ENCOUNTER — Other Ambulatory Visit (INDEPENDENT_AMBULATORY_CARE_PROVIDER_SITE_OTHER): Payer: Commercial Managed Care - PPO

## 2017-11-12 ENCOUNTER — Encounter: Payer: Self-pay | Admitting: General Practice

## 2017-11-12 DIAGNOSIS — E039 Hypothyroidism, unspecified: Secondary | ICD-10-CM

## 2017-11-12 LAB — TSH: TSH: 3.16 u[IU]/mL (ref 0.35–4.50)

## 2017-12-26 ENCOUNTER — Encounter: Payer: Self-pay | Admitting: Family Medicine

## 2017-12-26 ENCOUNTER — Ambulatory Visit: Payer: Commercial Managed Care - PPO | Admitting: Family Medicine

## 2017-12-26 ENCOUNTER — Ambulatory Visit: Payer: Self-pay | Admitting: *Deleted

## 2017-12-26 ENCOUNTER — Other Ambulatory Visit: Payer: Self-pay

## 2017-12-26 VITALS — BP 141/92 | HR 66 | Temp 98.1°F | Resp 16 | Ht 71.0 in | Wt 344.1 lb

## 2017-12-26 DIAGNOSIS — R079 Chest pain, unspecified: Secondary | ICD-10-CM | POA: Diagnosis not present

## 2017-12-26 MED ORDER — NITROGLYCERIN 0.4 MG SL SUBL: 0.4000 mg | SUBLINGUAL_TABLET | SUBLINGUAL | 0 refills | Status: DC | PRN

## 2017-12-26 NOTE — Patient Instructions (Signed)
Follow up as needed or as scheduled EKG looks great but we're going to have Cardiology do a full assessment START a once daily 81mg  aspirin If you again have chest pain, please sit down and rest and take a nitro If chest pain changes or worsens, please call 911 or go directly to ER Call with any questions or concerns Hang in there!!!

## 2017-12-26 NOTE — Assessment & Plan Note (Signed)
Pt has gained 18 lbs since last visit and BMI is now 48.  This puts him at greater risk for CAD.  Stressed need for healthy diet and once cleared by Cards, needs to start regular exercise.  Will follow.

## 2017-12-26 NOTE — Progress Notes (Signed)
   Subjective:    Patient ID: Rodney Mcclain, male    DOB: 15-Oct-1975, 42 y.o.   MRN: [id number]  HPI CP- pt reports tightness across the chest.  sxs started yesterday and are intermittent but 'when they come, it's PRETTY tight'.  sxs last ~30 minutes.  Pt reports 7 total episodes.  sxs are occurring when up and moving around.  No relation to food.  'it's not heartburn'.  No SOB.  'it just kinda goes away'.  Applying pressure to his chest improved the tightness.  No hx of similar.  No radiation into arm or neck or jaw.  No nausea.  Not currently taking daily ASA.  Pt is currently asymptomatic.  + family hx of CAD.  Pt has gained 18 lbs since last visit- BMI now 48   Review of Systems For ROS see HPI     Objective:   Physical Exam  Constitutional: He is oriented to person, place, and time. He appears well-developed and well-nourished. No distress.  Morbidly obese  HENT:  Head: Normocephalic and atraumatic.  Eyes: Conjunctivae and EOM are normal. Pupils are equal, round, and reactive to light.  Neck: Normal range of motion. Neck supple. No thyromegaly present.  Cardiovascular: Normal rate, regular rhythm, normal heart sounds and intact distal pulses.  No murmur heard. Pulmonary/Chest: Effort normal and breath sounds normal. No respiratory distress.  Abdominal: Soft. Bowel sounds are normal. He exhibits no distension.  Musculoskeletal: He exhibits no edema.  Lymphadenopathy:    He has no cervical adenopathy.  Neurological: He is alert and oriented to person, place, and time. No cranial nerve deficit.  Skin: Skin is warm and dry.  Psychiatric: He has a normal mood and affect. His behavior is normal.  Vitals reviewed.         Assessment & Plan:  Chest pain- new.  Pt's CP is concerning for possible angina vs esophageal spasm.  Pt denies association w/ food or any GERD sxs.  His morbid obesity, HTN, and family hx put him at greater risk of CAD.  EKG WNL today.  Refer to cards stat for  complete assessment.  Start daily ASA.  Script for nitro given along w/ instructions to call 911 if nitro is needed.  Pt expressed understanding and is in agreement w/ plan.

## 2017-12-26 NOTE — Telephone Encounter (Signed)
Pt called with complaints of chest pain yesterday  Around 1700, and it came back today; he describes it as "chest tightness" that lasted about 30 minutes with each episode; today's episode started at around 0800; recommendations made per nurse triage to include seeing a physician within 24 hours; pt offered and accepted appointment with Dr Beverely Lowabori, [facility], 12/26/17 at 1415; pt verbalizes understanding; will route to office for upcoming appointment.   Reason for Disposition . [1] Chest pain lasts > 5 minutes AND [2] occurred > 3 days ago (72 hours) AND [3] NO chest pain or cardiac symptoms now  Answer Assessment - Initial Assessment Questions 1. LOCATION: "Where does it hurt?"       Both sides of upper chest 2. RADIATION: "Does the pain go anywhere else?" (e.g., into neck, jaw, arms, back)     no 3. ONSET: "When did the chest pain begin?" (Minutes, hours or days)      1st episode 12/25/17 4. PATTERN "Does the pain come and go, or has it been constant since it started?"  "Does it get worse with exertion?"      Comes and goes 5. DURATION: "How long does it last" (e.g., seconds, minutes, hours)    30 minutes 6. SEVERITY: "How bad is the pain?"  (e.g., Scale 1-10; mild, moderate, or severe)    - MILD (1-3): doesn't interfere with normal activities     - MODERATE (4-7): interferes with normal activities or awakens from sleep    - SEVERE (8-10): excruciating pain, unable to do any normal activities       Rated 3-4 out of 10 7. CARDIAC RISK FACTORS: "Do you have any history of heart problems or risk factors for heart disease?" (e.g., prior heart attack, angina; high blood pressure, diabetes, being overweight, high cholesterol, smoking, or strong family history of heart disease)     Borderline hypertension; overweight; family history 8. PULMONARY RISK FACTORS: "Do you have any history of lung disease?"  (e.g., blood clots in lung, asthma, emphysema, birth control pills)     no 9. CAUSE: "What do  you think is causing the chest pain?"     Not sure 10. OTHER SYMPTOMS: "Do you have any other symptoms?" (e.g., dizziness, nausea, vomiting, sweating, fever, difficulty breathing, cough)       no 11. PREGNANCY: "Is there any chance you are pregnant?" "When was your last menstrual period?"       no  Protocols used: CHEST PAIN-A-AH

## 2017-12-31 ENCOUNTER — Ambulatory Visit: Payer: Commercial Managed Care - PPO | Admitting: Cardiology

## 2017-12-31 ENCOUNTER — Encounter: Payer: Self-pay | Admitting: Cardiology

## 2017-12-31 ENCOUNTER — Encounter: Payer: Self-pay | Admitting: *Deleted

## 2017-12-31 VITALS — BP 124/76 | HR 76 | Ht 71.0 in | Wt 341.8 lb

## 2017-12-31 DIAGNOSIS — E785 Hyperlipidemia, unspecified: Secondary | ICD-10-CM

## 2017-12-31 DIAGNOSIS — R072 Precordial pain: Secondary | ICD-10-CM | POA: Diagnosis not present

## 2017-12-31 DIAGNOSIS — I10 Essential (primary) hypertension: Secondary | ICD-10-CM

## 2017-12-31 MED ORDER — ROSUVASTATIN CALCIUM 10 MG PO TABS: 10.0000 mg | ORAL_TABLET | Freq: Every day | ORAL | 3 refills | Status: DC

## 2017-12-31 MED ORDER — METOPROLOL TARTRATE 25 MG PO TABS: 25.0000 mg | ORAL_TABLET | Freq: Two times a day (BID) | ORAL | 3 refills | Status: DC

## 2017-12-31 NOTE — Patient Instructions (Signed)
Medication Instructions:  Your physician has recommended you make the following change in your medication: START crestor 10 mg daily START metoprolol tartrate 25 mg twice daily  Labwork: None  Testing/Procedures: You had an EKG today.   Your physician has requested that you have an exercise tolerance test. For further information please visit [email or web address] Please also follow instruction sheet, as given.  You will be [facility] to set up your sleep study.   Follow-Up: Your physician recommends that you schedule a follow-up appointment in: 1 month.  Any Other Special Instructions Will Be Listed Below (If Applicable).     If you need a refill on your cardiac medications before your next appointment, please call your pharmacy.

## 2017-12-31 NOTE — Progress Notes (Signed)
Cardiology Consultation:    Date:  12/31/2017   ID:  Rodney Mcclain, DOB Jun 25, 1976, MRN [id number]  PCP:  Sheliah Hatch, MD  Cardiologist:  Gypsy Balsam, MD   Referring MD: Sheliah Hatch, MD   Chief Complaint  Patient presents with  . Chest Pain  Chest pain  History of Present Illness:    Rodney Mcclain is a 42 y.o. male who is being seen today for the evaluation of chest pain at the request of Sheliah Hatch, MD.  For last few weeks to months he experienced chest pain.  He described pain as heavy sensation in the chest like somebody sitting in his chest severity is 3-4 and scale up to 10.  It can happen when he walks last up to 30 minutes.  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  He tells me he walks every day about 8 miles.  He could be walking developed a sensation that he will keep going and the sensation will go away.  There is no sweating no shortness of breath associated with this sensation.  Massaging his chest helps.  He does have nitroglycerin but did not have a chance to try it.  Drinking or eating does not change characteristic of the pain.  Taking deep breath does not make it worse. His risk factors for coronary artery disease include recently recognized hypertension, dyslipidemia, remote history of smoking, family history of premature coronary artery disease.  Past Medical History:  Diagnosis Date  . Anal fissure   . Anxiety and depression   . Depression   . Diverticulitis   . Gout   . Hypertension   . Obesity     Past Surgical History:  Procedure Laterality Date  . HIP SURGERY  1986 & 1991   bilateral pin placement  . TOE SURGERY     bilateral for bone spurs    Current Medications: Current Meds  Medication Sig  . indomethacin (INDOCIN) 50 MG capsule 1 tab TID at first sign of flare and as pain improves decrease to BID and then daily and then D/C as able  . levothyroxine (SYNTHROID, LEVOTHROID) 112 MCG tablet Take  1 tablet (112 mcg total) by mouth daily.  . meclizine (ANTIVERT) 25 MG tablet Take 1 tablet (25 mg total) by mouth 3 (three) times daily as needed for dizziness.  . Multiple Vitamins-Minerals (MULTIVITAMIN ADULT PO) Take by mouth.  . nitroGLYCERIN (NITROSTAT) 0.4 MG SL tablet Place 1 tablet (0.4 mg total) under the tongue every 5 (five) minutes as needed for chest pain.  . Omega-3 Fatty Acids (FISH OIL PO) Take by mouth.  . ondansetron (ZOFRAN) 4 MG tablet Take 1 tablet (4 mg total) by mouth every 8 (eight) hours as needed for nausea or vomiting.  . Probiotic Product (PROBIOTIC PO) Take by mouth.     Allergies:   Patient has no known allergies.   Social History   Socioeconomic History  . Marital status: Married    Spouse name: Not on file  . Number of children: 1  . Years of education: Not on file  . Highest education level: Not on file  Occupational History  . Occupation: Librarian, academic  Social Needs  . Financial resource strain: Not on file  . Food insecurity:    Worry: Not on file    Inability: Not on file  . Transportation needs:    Medical: Not on file    Non-medical: Not on file  Tobacco Use  . Smoking status: Former Smoker    Last attempt to quit: 10/09/2005    Years since quitting: 12.2  . Smokeless tobacco: Never Used  Substance and Sexual Activity  . Alcohol use: Yes    Alcohol/week: 0.0 oz    Comment: occ.  . Drug use: No  . Sexual activity: Not on file  Lifestyle  . Physical activity:    Days per week: Not on file    Minutes per session: Not on file  . Stress: Not on file  Relationships  . Social connections:    Talks on phone: Not on file    Gets together: Not on file    Attends religious service: Not on file    Active member of club or organization: Not on file    Attends meetings of clubs or organizations: Not on file    Relationship status: Not on file  Other Topics Concern  . Not on file  Social History Narrative  . Not on file      Family History: The patient's family history includes Arthritis in his father, maternal grandfather, and mother; Diabetes in his maternal grandmother and mother; Heart disease in his mother; Hypertension in his mother and unknown relative; Kidney cancer in his father; Lung cancer in his unknown relative; Stroke in his maternal grandfather. There is no history of Colon cancer, Colon polyps, Esophageal cancer, or Gallbladder disease. ROS:   Please see the history of present illness.    All 14 point review of systems negative except as described per history of present illness.  EKGs/Labs/Other Studies Reviewed:    The following studies were reviewed today:   EKG:  EKG is  ordered today.  The ekg ordered today demonstrates normal sinus rhythm, normal P interval, normal QS complex duration morphology.  Recent Labs: 10/10/2017: ALT 39; BUN 15; Creatinine, Ser 0.98; Hemoglobin 15.9; Platelets 187.0; Potassium 4.8; Sodium 138 11/12/2017: TSH 3.16  Recent Lipid Panel    Component Value Date/Time   CHOL 234 (H) 10/10/2017 0848   TRIG 156.0 (H) 10/10/2017 0848   HDL 64.50 10/10/2017 0848   CHOLHDL 4 10/10/2017 0848   VLDL 31.2 10/10/2017 0848   LDLCALC 138 (H) 10/10/2017 0848    Physical Exam:    VS:  BP 124/76   Pulse 76   Ht 5\' 11"  (1.803 m)   Wt (!) 341 lb 12.8 oz (155 kg)   SpO2 98%   BMI 47.67 kg/m     Wt Readings from Last 3 Encounters:  12/31/17 (!) 341 lb 12.8 oz (155 kg)  12/26/17 (!) 344 lb 2 oz (156.1 kg)  10/10/17 (!) 325 lb 8 oz (147.6 kg)     GEN:  Well nourished, well developed in no acute distress HEENT: Normal NECK: No JVD; No carotid bruits LYMPHATICS: No lymphadenopathy CARDIAC: RRR, no murmurs, no rubs, no gallops RESPIRATORY:  Clear to auscultation without rales, wheezing or rhonchi  ABDOMEN: Soft, non-tender, non-distended MUSCULOSKELETAL:  No edema; No deformity  SKIN: Warm and dry NEUROLOGIC:  Alert and oriented x 3 PSYCHIATRIC:  Normal affect    ASSESSMENT:    1. Essential hypertension   2. Morbid obesity (HCC)   3. Dyslipidemia   4. Precordial chest pain    PLAN:    In order of problems listed above:  1. Precordial chest pain: Somewhat atypical but with his risk factors I think it would be appropriate to perform stress test to rule out inducible ischemia.  In the meantime  he is taking aspirin which I will continue.  I will also ask him to start taking Crestor 10 mg daily.  I put him on metoprolol 25 mg twice daily however if stress test is negative that medication will be discontinued.  He does know how to take nitroglycerin.  He understand that if nitroglycerin does not relieve the pain he need to go to the emergency room.  We will repeat EKG today make sure that plain treadmill EKG stress this should be sufficient. 2. Dyslipidemia: His HDL is high however LDL is also elevated I advised to start taking small dose of statin Crestor 10 mill grams daily will be initiated. 3. Sleep apnea: He snores multiple family members do have sleep apnea.  He actually requested visit with sleep specialist and he is going to have that visit in 10 days in the meantime we will try to make arrangements for sleep study. 4. Morbid obesity: He is surprised that his weight is up.  He walks a lot at work and was hoping to lose some weight.  We will have discussion later about weight management. 5. Hypothyroidism: On Synthroid which I will continue.   Medication Adjustments/Labs and Tests Ordered: Current medicines are reviewed at length with the patient today.  Concerns regarding medicines are outlined above.  No orders of the defined types were placed in this encounter.  No orders of the defined types were placed in this encounter.   Signed, Georgeanna Leaobert J. Emari Demmer, MD, [facility]. 12/31/2017 8:37 AM    [facility]

## 2018-01-09 ENCOUNTER — Ambulatory Visit (INDEPENDENT_AMBULATORY_CARE_PROVIDER_SITE_OTHER): Payer: Commercial Managed Care - PPO

## 2018-01-09 DIAGNOSIS — R072 Precordial pain: Secondary | ICD-10-CM | POA: Diagnosis not present

## 2018-01-09 DIAGNOSIS — I10 Essential (primary) hypertension: Secondary | ICD-10-CM

## 2018-01-09 DIAGNOSIS — E785 Hyperlipidemia, unspecified: Secondary | ICD-10-CM | POA: Diagnosis not present

## 2018-01-09 LAB — EXERCISE TOLERANCE TEST
CSEPED: 7 min
Estimated workload: 8.5 METS
Exercise duration (sec): 0 s
MPHR: 177 {beats}/min
Peak HR: 169 {beats}/min
Percent HR: 95 %
RPE: 17
Rest HR: 74 {beats}/min

## 2018-01-11 ENCOUNTER — Ambulatory Visit (INDEPENDENT_AMBULATORY_CARE_PROVIDER_SITE_OTHER): Payer: Commercial Managed Care - PPO | Admitting: Internal Medicine

## 2018-01-11 ENCOUNTER — Encounter: Payer: Self-pay | Admitting: Internal Medicine

## 2018-01-11 VITALS — BP 130/64 | HR 71 | Ht 71.0 in | Wt 341.2 lb

## 2018-01-11 DIAGNOSIS — R0683 Snoring: Secondary | ICD-10-CM

## 2018-01-11 DIAGNOSIS — G4733 Obstructive sleep apnea (adult) (pediatric): Secondary | ICD-10-CM

## 2018-01-11 NOTE — Patient Instructions (Addendum)
Order- please schedule unattended home sleep test    Dx OSA  Please call me about 2 weeks after your sleep test, for results and recommendation.   If appropriate, we may be able to start treatment before we see you next.

## 2018-01-11 NOTE — Progress Notes (Signed)
HPI  01/11/18-42 year old male former smoker for sleep evaluation. Medical problem list includes allergic rhinitis, HBP, gout, morbid obesity, hypothyroid. ----tired all the time when wakes up as much as when he went to bed, wife tells him he snores He wakes is tired as he went to bed.  Loud snoring.  Witnessed apneas.  Told of sleep talking but no other parasomnias. Mother, brothers, sister all use CPAP. 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 Bedtime 8 PM, waking 1-3 times before up at 4:30 AM. Denies ENT surgery, heart or lung problems.  Treated hypothyroidism. Epworth score 11  Prior to Admission medications   Medication Sig Start Date End Date Taking? Authorizing Provider  aspirin EC 81 MG tablet Take 81 mg by mouth daily.   Yes [provider]  levothyroxine (SYNTHROID, LEVOTHROID) 112 MCG tablet Take 1 tablet (112 mcg total) by mouth daily. 11/08/17  Yes Sheliah Hatchabori, [name], MD  metoprolol tartrate (LOPRESSOR) 25 MG tablet Take 1 tablet (25 mg total) by mouth 2 (two) times daily. 12/31/17 03/31/18 Yes Georgeanna LeaKrasowski, Robert J, MD  Multiple Vitamins-Minerals (MULTIVITAMIN ADULT PO) Take by mouth.   Yes [provider]  Omega-3 Fatty Acids (FISH OIL PO) Take by mouth.   Yes [provider]  Probiotic Product (PROBIOTIC PO) Take by mouth.   Yes [provider]  rosuvastatin (CRESTOR) 10 MG tablet Take 1 tablet (10 mg total) by mouth daily. Patient taking differently: Take 10 mg by mouth 2 (two) times daily.  12/31/17 03/31/18 Yes Georgeanna LeaKrasowski, Robert J, MD  indomethacin (INDOCIN) 50 MG capsule 1 tab TID at first sign of flare and as pain improves decrease to BID and then daily and then D/C as able Patient not taking: Reported on 01/11/2018 10/10/17   Sheliah Hatchabori, [name], MD  meclizine (ANTIVERT) 25 MG tablet Take 1 tablet (25 mg total) by mouth 3 (three) times daily as needed for dizziness. Patient not taking: Reported on  01/11/2018 03/29/17   Sheliah Hatchabori, [name], MD  nitroGLYCERIN (NITROSTAT) 0.4 MG SL tablet Place 1 tablet (0.4 mg total) under the tongue every 5 (five) minutes as needed for chest pain. Patient not taking: Reported on 01/11/2018 12/26/17   Sheliah Hatchabori, [name], MD   Past Medical History:  Diagnosis Date  . Anal fissure   . Anxiety and depression   . Depression   . Diverticulitis   . Gout   . Hypertension   . Obesity    Past Surgical History:  Procedure Laterality Date  . HIP SURGERY  1986 & 1991   bilateral pin placement  . TOE SURGERY     bilateral for bone spurs   Family History  Problem Relation Age of Onset  . Arthritis Mother   . Heart disease Mother   . Hypertension Mother   . Diabetes Mother   . Sleep apnea Mother   . Arthritis Father   . Kidney cancer Father   . Arthritis Maternal Grandfather   . Stroke Maternal Grandfather   . Lung cancer Unknown        aunt  . Hypertension Unknown        all grandparents  . Diabetes Maternal Grandmother   . Sleep apnea Sister   . Sleep apnea Brother   . Colon cancer Neg Hx   . Colon polyps Neg Hx   . Esophageal cancer Neg Hx   . Gallbladder disease Neg Hx    Social History   Socioeconomic History  .  Marital status: Married    Spouse name: Not on file  . Number of children: 1  . Years of education: Not on file  . Highest education level: Not on file  Occupational History  . Occupation: Librarian, academic  Social Needs  . Financial resource strain: Not on file  . Food insecurity:    Worry: Not on file    Inability: Not on file  . Transportation needs:    Medical: Not on file    Non-medical: Not on file  Tobacco Use  . Smoking status: Former Smoker    Packs/day: 1.50    Years: 18.00    Pack years: 27.00    Last attempt to quit: 10/09/2005    Years since quitting: 12.2  . Smokeless tobacco: Never Used  Substance and Sexual Activity  . Alcohol use: Yes    Alcohol/week: 0.0 oz    Comment: occ.  . Drug use:  No  . Sexual activity: Not on file  Lifestyle  . Physical activity:    Days per week: Not on file    Minutes per session: Not on file  . Stress: Not on file  Relationships  . Social connections:    Talks on phone: Not on file    Gets together: Not on file    Attends religious service: Not on file    Active member of club or organization: Not on file    Attends meetings of clubs or organizations: Not on file    Relationship status: Not on file  . Intimate partner violence:    Fear of current or ex partner: Not on file    Emotionally abused: Not on file    Physically abused: Not on file    Forced sexual activity: Not on file  Other Topics Concern  . Not on file  Social History Narrative  . Not on file   ROS-see HPI   + = positive Constitutional:    weight loss, night sweats, fevers, chills, fatigue, lassitude. HEENT:    headaches, difficulty swallowing, tooth/dental problems, sore throat,       sneezing, itching, ear ache, nasal congestion, post nasal drip, snoring CV:    +chest pain, orthopnea, PND, swelling in lower extremities, anasarca,                                  dizziness, palpitations Resp:   shortness of breath with exertion or at rest.                productive cough,   non-productive cough, coughing up of blood.              change in color of mucus.  wheezing.   Skin:    rash or lesions. GI:  No-   heartburn, indigestion, abdominal pain, nausea, vomiting, diarrhea,                 change in bowel habits, loss of appetite GU: dysuria, change in color of urine, no urgency or frequency.   flank pain. MS:   joint pain, stiffness, decreased range of motion, back pain. Neuro-     nothing unusual Psych:  change in mood or affect.  depression or anxiety.   memory loss.  OBJ- Physical Exam General- Alert, Oriented, Affect-appropriate, Distress- none acute, + obese Skin- rash-none, lesions- none, excoriation- none Lymphadenopathy- none Head- atraumatic  Eyes- Gross vision intact, PERRLA, conjunctivae and secretions clear            Ears- Hearing, canals-normal            Nose- Clear, no-Septal dev, mucus, polyps, erosion, perforation             Throat- Mallampati III-IV , mucosa clear , drainage- none, tonsils- atrophic Neck- flexible , trachea midline, no stridor , thyroid nl, carotid no bruit Chest - symmetrical excursion , unlabored           Heart/CV- RRR , no murmur , no gallop  , no rub, nl s1 s2                           - JVD- none , edema- none, stasis changes- none, varices- none           Lung- clear to P&A, wheeze- none, cough- none , dullness-none, rub- none           Chest wall-  Abd-  Br/ Gen/ Rectal- Not done, not indicated Extrem- cyanosis- none, clubbing, none, atrophy- none, strength- nl Neuro- grossly intact to observation

## 2018-01-12 DIAGNOSIS — R0683 Snoring: Secondary | ICD-10-CM | POA: Insufficient documentation

## 2018-01-12 NOTE — Assessment & Plan Note (Signed)
He is strongly advised to work towards normal weight.

## 2018-01-12 NOTE — Assessment & Plan Note (Signed)
Probability that he has medically significant obstructive sleep apnea.  We reviewed basic sleep hygiene, medical issues of OSA, diagnosis and treatment outline. Plan-schedule sleep study.

## 2018-01-29 DIAGNOSIS — G4733 Obstructive sleep apnea (adult) (pediatric): Secondary | ICD-10-CM | POA: Diagnosis not present

## 2018-01-30 ENCOUNTER — Other Ambulatory Visit: Payer: Self-pay | Admitting: *Deleted

## 2018-01-30 DIAGNOSIS — G4733 Obstructive sleep apnea (adult) (pediatric): Secondary | ICD-10-CM | POA: Diagnosis not present

## 2018-01-31 ENCOUNTER — Ambulatory Visit: Payer: Commercial Managed Care - PPO | Admitting: Cardiology

## 2018-01-31 ENCOUNTER — Encounter: Payer: Self-pay | Admitting: Cardiology

## 2018-01-31 VITALS — BP 102/70 | HR 58 | Ht 71.0 in | Wt 343.8 lb

## 2018-01-31 DIAGNOSIS — R0683 Snoring: Secondary | ICD-10-CM | POA: Diagnosis not present

## 2018-01-31 DIAGNOSIS — R0789 Other chest pain: Secondary | ICD-10-CM | POA: Diagnosis not present

## 2018-01-31 DIAGNOSIS — I10 Essential (primary) hypertension: Secondary | ICD-10-CM

## 2018-01-31 DIAGNOSIS — E785 Hyperlipidemia, unspecified: Secondary | ICD-10-CM | POA: Diagnosis not present

## 2018-01-31 NOTE — Patient Instructions (Addendum)
Medication Instructions:  Your physician has recommended you make the following change in your medication:  STOP metoprolol tartrate   Labwork: Your physician recommends that you return for lab work at your PCP office: lipid panel. Please take the prescription with you to your appointment.   Testing/Procedures: None  Follow-Up: Your physician wants you to follow-up in: 5 months. You will receive a reminder letter in the mail two months in advance. If you don't receive a letter, please call our office to schedule the follow-up appointment.   Any Other Special Instructions Will Be Listed Below (If Applicable).     If you need a refill on your cardiac medications before your next appointment, please call your pharmacy.

## 2018-01-31 NOTE — Progress Notes (Signed)
Cardiology Office Note:    Date:  01/31/2018   ID:  Rodney Mcclain, DOB 07-Jul-1976, MRN [id number]  PCP:  Sheliah Hatch, MD  Cardiologist:  Gypsy Balsam, MD    Referring MD: Sheliah Hatch, MD   Chief Complaint  Patient presents with  . 1 month follow up  Doing much better  History of Present Illness:    Rodney Mcclain is a 42 y.o. male with episode of chest pain.  He does have multiple risk factors for coronary artery disease and stress test was done.  He will come to terminal 7 months did not have any chest pain did not have an EKG changes.  Test was read as negative.  Since that time I have seen him last time he had one episode of chest pain that happened when he was walking he stopped about 45 points for pain go away.  He does have history of GERD and he used to take some medication for it however stopped years ago because of lack of symptoms.  I am worried that maybe his symptomatology is related to his GI issues.  I asked him to get himself either Maalox and Mylanta and try it when he got the pain to see if there is in the relief from it. In terms of risk factors modifications.  He is also on statin which I will continue for now we will recheck his fasting lipid profile within the next few weeks.  The reason for his hyperlipidemia and high LDL it previously was hypothyroidism that been corrected.  On top of the holiday season probably did not help.  Now he is trying to stick with good diet he is very active and have to walk a lot and have no difficulty doing it.  Past Medical History:  Diagnosis Date  . Anal fissure   . Anxiety and depression   . Depression   . Diverticulitis   . Gout   . Hypertension   . Obesity     Past Surgical History:  Procedure Laterality Date  . HIP SURGERY  1986 & 1991   bilateral pin placement  . TOE SURGERY     bilateral for bone spurs    Current Medications: Current Meds  Medication Sig  . aspirin EC 81 MG tablet Take 81 mg by  mouth daily.  . indomethacin (INDOCIN) 50 MG capsule 1 tab TID at first sign of flare and as pain improves decrease to BID and then daily and then D/C as able  . levothyroxine (SYNTHROID, LEVOTHROID) 112 MCG tablet Take 1 tablet (112 mcg total) by mouth daily.  . meclizine (ANTIVERT) 25 MG tablet Take 1 tablet (25 mg total) by mouth 3 (three) times daily as needed for dizziness.  . metoprolol tartrate (LOPRESSOR) 25 MG tablet Take 1 tablet (25 mg total) by mouth 2 (two) times daily.  . Multiple Vitamins-Minerals (MULTIVITAMIN ADULT PO) Take by mouth.  . nitroGLYCERIN (NITROSTAT) 0.4 MG SL tablet Place 1 tablet (0.4 mg total) under the tongue every 5 (five) minutes as needed for chest pain.  . Omega-3 Fatty Acids (FISH OIL PO) Take by mouth.  . Probiotic Product (PROBIOTIC PO) Take by mouth.  . rosuvastatin (CRESTOR) 10 MG tablet Take 1 tablet (10 mg total) by mouth daily. (Patient taking differently: Take 10 mg by mouth 2 (two) times daily. )     Allergies:   Patient has no known allergies.   Social History   Socioeconomic History  . Marital status:  Married    Spouse name: Not on file  . Number of children: 1  . Years of education: Not on file  . Highest education level: Not on file  Occupational History  . Occupation: Librarian, academic  Social Needs  . Financial resource strain: Not on file  . Food insecurity:    Worry: Not on file    Inability: Not on file  . Transportation needs:    Medical: Not on file    Non-medical: Not on file  Tobacco Use  . Smoking status: Former Smoker    Packs/day: 1.50    Years: 18.00    Pack years: 27.00    Last attempt to quit: 10/09/2005    Years since quitting: 12.3  . Smokeless tobacco: Never Used  Substance and Sexual Activity  . Alcohol use: Yes    Alcohol/week: 0.0 oz    Comment: occ.  . Drug use: No  . Sexual activity: Not on file  Lifestyle  . Physical activity:    Days per week: Not on file    Minutes per session: Not on  file  . Stress: Not on file  Relationships  . Social connections:    Talks on phone: Not on file    Gets together: Not on file    Attends religious service: Not on file    Active member of club or organization: Not on file    Attends meetings of clubs or organizations: Not on file    Relationship status: Not on file  Other Topics Concern  . Not on file  Social History Narrative  . Not on file     Family History: The patient's family history includes Arthritis in his father, maternal grandfather, and mother; Diabetes in his maternal grandmother and mother; Heart disease in his mother; Hypertension in his mother and unknown relative; Kidney cancer in his father; Lung cancer in his unknown relative; Sleep apnea in his brother, mother, and sister; Stroke in his maternal grandfather. There is no history of Colon cancer, Colon polyps, Esophageal cancer, or Gallbladder disease. ROS:   Please see the history of present illness.    All 14 point review of systems negative except as described per history of present illness  EKGs/Labs/Other Studies Reviewed:      Recent Labs: 10/10/2017: ALT 39; BUN 15; Creatinine, Ser 0.98; Hemoglobin 15.9; Platelets 187.0; Potassium 4.8; Sodium 138 11/12/2017: TSH 3.16  Recent Lipid Panel    Component Value Date/Time   CHOL 234 (H) 10/10/2017 0848   TRIG 156.0 (H) 10/10/2017 0848   HDL 64.50 10/10/2017 0848   CHOLHDL 4 10/10/2017 0848   VLDL 31.2 10/10/2017 0848   LDLCALC 138 (H) 10/10/2017 0848    Physical Exam:    VS:  BP 104/68   Pulse (!) 58   Ht 5\' 11"  (1.803 m)   Wt (!) 343 lb 12.8 oz (155.9 kg)   SpO2 96%   BMI 47.95 kg/m     Wt Readings from Last 3 Encounters:  01/31/18 (!) 343 lb 12.8 oz (155.9 kg)  01/11/18 (!) 341 lb 3.2 oz (154.8 kg)  12/31/17 (!) 341 lb 12.8 oz (155 kg)     GEN:  Well nourished, well developed in no acute distress HEENT: Normal NECK: No JVD; No carotid bruits LYMPHATICS: No lymphadenopathy CARDIAC: RRR, no  murmurs, no rubs, no gallops RESPIRATORY:  Clear to auscultation without rales, wheezing or rhonchi  ABDOMEN: Soft, non-tender, non-distended MUSCULOSKELETAL:  No edema; No deformity  SKIN: Warm and  dry LOWER EXTREMITIES: no swelling NEUROLOGIC:  Alert and oriented x 3 PSYCHIATRIC:  Normal affect   ASSESSMENT:    1. Essential hypertension   2. Dyslipidemia   3. Snoring   4. Atypical chest pain    PLAN:    In order of problems listed above:  1. Essential hypertension his blood pressure appears to be low, will recheck blood pressure before he get out of the office. 2. Dyslipidemia: Will ask primary care physician that he is going to see them next month to have fasting lipid profile done to reassess the situation 3. Snoring: He did have appointment with a sleep specialist he did have sleep study 2 days ago no results yet. 4. Atypical chest pain: Stress test negative.  I will see him back in my office in about 4 to 5 months to revisit all decisions see how he does.   Medication Adjustments/Labs and Tests Ordered: Current medicines are reviewed at length with the patient today.  Concerns regarding medicines are outlined above.  No orders of the defined types were placed in this encounter.  Medication changes: No orders of the defined types were placed in this encounter.   Signed, Georgeanna Leaobert J. Chanequa Spees, MD, [facility] 01/31/2018 9:21 AM    [facility]

## 2018-01-31 NOTE — Addendum Note (Signed)
Addended by: Crist FatLOCKHART, CATHERINE P on: 01/31/2018 09:37 AM   Modules accepted: Orders

## 2018-02-11 ENCOUNTER — Other Ambulatory Visit: Payer: Self-pay | Admitting: Internal Medicine

## 2018-02-11 DIAGNOSIS — G4733 Obstructive sleep apnea (adult) (pediatric): Secondary | ICD-10-CM

## 2018-02-17 ENCOUNTER — Encounter: Payer: Self-pay | Admitting: Internal Medicine

## 2018-02-18 NOTE — Telephone Encounter (Signed)
Depending on the type of machine he has access to, I suggest either autopap 5-15, or fixed pressure 10 cwp.  The mask fit is important for a comfortable seal. Where does he plan to get a mask from? The style doesn't matter- full-face, nasal or nasal pillows, bu it needs to be the right size.

## 2018-02-18 NOTE — Telephone Encounter (Signed)
Dr. Maple Hudson please see email from pt.  Please advise if you would like to order a pressure on pt's current machine. Thanks.

## 2018-02-21 NOTE — Telephone Encounter (Signed)
Pt emailed the office because [email or web address] had reached out to him saying that a form was faxed to this office on 5.15.19 and the next step was for pt to contact the office  Called [email or web address] @ [phone] and spoke with LaLa  CMN was faxed but Synetta Fail is not in the office Requested this be faxed again to triage - fax received  E-mail sent to patient letting him know the fax was received Fax given to [employer] routed to [employer]

## 2018-03-06 ENCOUNTER — Other Ambulatory Visit: Payer: Self-pay | Admitting: General Practice

## 2018-03-06 MED ORDER — LEVOTHYROXINE SODIUM 112 MCG PO TABS: 112.0000 ug | ORAL_TABLET | Freq: Every day | ORAL | 1 refills | Status: DC

## 2018-03-28 ENCOUNTER — Ambulatory Visit: Payer: Commercial Managed Care - PPO | Admitting: Family Medicine

## 2018-03-28 ENCOUNTER — Other Ambulatory Visit: Payer: Self-pay

## 2018-03-28 ENCOUNTER — Encounter: Payer: Self-pay | Admitting: Family Medicine

## 2018-03-28 VITALS — BP 130/78 | HR 80 | Temp 97.9°F | Resp 16 | Ht 71.0 in | Wt 332.2 lb

## 2018-03-28 DIAGNOSIS — I10 Essential (primary) hypertension: Secondary | ICD-10-CM | POA: Diagnosis not present

## 2018-03-28 DIAGNOSIS — E785 Hyperlipidemia, unspecified: Secondary | ICD-10-CM

## 2018-03-28 NOTE — Assessment & Plan Note (Signed)
BP is adequately controlled today off his medication.  Metoprolol was stopped due to hypotension.  Encouraged healthy diet and regular exercise.  Will follow.

## 2018-03-28 NOTE — Progress Notes (Signed)
   Subjective:    Patient ID: Rodney Mcclain, male    DOB: 1975-12-03, 42 y.o.   MRN: [id number]  HPI HTN- chronic problem.  Adequate control w/o medication at this time.  Was previously on Metoprolol but BP dropped too low.  No CP, SOB, HAs, visual changes, edema.  Hyperlipidemia- pt was started on Crestor by Cardiology in March but this was never sent to mail order so he is not taking this.  No abd pain, N/V.  Obesity- chronic problem.  BMI is 46.3 but pt has lost 12 lbs since last visit.  Pt reports eating healthier- able to cook now that he has lost his job.  Is more active- spending time at water park w/ daughter.   Review of Systems For ROS see HPI     Objective:   Physical Exam  Constitutional: He is oriented to person, place, and time. He appears well-developed and well-nourished. No distress.  obese  HENT:  Head: Normocephalic and atraumatic.  Eyes: Pupils are equal, round, and reactive to light. Conjunctivae and EOM are normal.  Neck: Normal range of motion. Neck supple. No thyromegaly present.  Cardiovascular: Normal rate, regular rhythm, normal heart sounds and intact distal pulses.  No murmur heard. Pulmonary/Chest: Effort normal and breath sounds normal. No respiratory distress.  Abdominal: Soft. Bowel sounds are normal. He exhibits no distension.  Musculoskeletal: He exhibits no edema.  Lymphadenopathy:    He has no cervical adenopathy.  Neurological: He is alert and oriented to person, place, and time. No cranial nerve deficit.  Skin: Skin is warm and dry.  Psychiatric: He has a normal mood and affect. His behavior is normal.  Vitals reviewed.         Assessment & Plan:

## 2018-03-28 NOTE — Assessment & Plan Note (Signed)
Pt was placed on Crestor by Cardiology in March.  This was not sent to Mail Order so it was not covered by insurance and he has not been taking this recently.  Check labs to determine if it is necessary to restart meds and at what dose.  Pt expressed understanding and is in agreement w/ plan.

## 2018-03-28 NOTE — Patient Instructions (Signed)
Schedule your complete physical in 6 months or when you get insurance We'll notify you of your lab results and make any changes if needed Continue to work on healthy diet and regular exercise- you're doing great!!! Call with any questions or concerns ENJOY YOUR SUMMER!!!

## 2018-03-28 NOTE — Assessment & Plan Note (Signed)
Ongoing issue for pt.  He is down 12 lbs since last visit.  Applauded his efforts.  Check labs to risk stratify.  Will follow closely.

## 2018-03-29 ENCOUNTER — Other Ambulatory Visit: Payer: Self-pay | Admitting: General Practice

## 2018-03-29 LAB — CBC WITH DIFFERENTIAL/PLATELET
Basophils Absolute: 0.1 10*3/uL (ref 0.0–0.1)
Basophils Relative: 1.3 % (ref 0.0–3.0)
Eosinophils Absolute: 0.2 10*3/uL (ref 0.0–0.7)
Eosinophils Relative: 3.4 % (ref 0.0–5.0)
HCT: 44.4 % (ref 39.0–52.0)
HEMOGLOBIN: 15.6 g/dL (ref 13.0–17.0)
Lymphocytes Relative: 29.5 % (ref 12.0–46.0)
Lymphs Abs: 1.8 10*3/uL (ref 0.7–4.0)
MCHC: 35.1 g/dL (ref 30.0–36.0)
MCV: 95.3 fl (ref 78.0–100.0)
Monocytes Absolute: 0.5 10*3/uL (ref 0.1–1.0)
Monocytes Relative: 8.5 % (ref 3.0–12.0)
Neutro Abs: 3.5 10*3/uL (ref 1.4–7.7)
Neutrophils Relative %: 57.3 % (ref 43.0–77.0)
Platelets: 207 10*3/uL (ref 150.0–400.0)
RBC: 4.66 Mil/uL (ref 4.22–5.81)
RDW: 13.5 % (ref 11.5–15.5)
WBC: 6.2 10*3/uL (ref 4.0–10.5)

## 2018-03-29 LAB — LIPID PANEL
Cholesterol: 230 mg/dL — ABNORMAL HIGH (ref 0–200)
HDL: 69.3 mg/dL (ref >39.00)
LDL Cholesterol: 141 mg/dL — ABNORMAL HIGH (ref 0–99)
NonHDL: 160.87
Total CHOL/HDL Ratio: 3
Triglycerides: 100 mg/dL (ref 0.0–149.0)
VLDL: 20 mg/dL (ref 0.0–40.0)

## 2018-03-29 LAB — HEPATIC FUNCTION PANEL
ALBUMIN: 4.4 g/dL (ref 3.5–5.2)
ALT: 16 U/L (ref 0–53)
AST: 18 U/L (ref 0–37)
Alkaline Phosphatase: 60 U/L (ref 39–117)
Bilirubin, Direct: 0.1 mg/dL (ref 0.0–0.3)
Total Bilirubin: 0.5 mg/dL (ref 0.2–1.2)
Total Protein: 7.1 g/dL (ref 6.0–8.3)

## 2018-03-29 LAB — TSH: TSH: 2.56 u[IU]/mL (ref 0.35–4.50)

## 2018-03-29 LAB — BASIC METABOLIC PANEL
BUN: 18 mg/dL (ref 6–23)
CHLORIDE: 101 meq/L (ref 96–112)
CO2: 32 meq/L (ref 19–32)
Calcium: 9.7 mg/dL (ref 8.4–10.5)
Creatinine, Ser: 1.12 mg/dL (ref 0.40–1.50)
GFR: 75.63 mL/min (ref >60.00)
Glucose, Bld: 96 mg/dL (ref 70–99)
Potassium: 5 mEq/L (ref 3.5–5.1)
Sodium: 140 mEq/L (ref 135–145)

## 2018-03-29 MED ORDER — ROSUVASTATIN CALCIUM 10 MG PO TABS: 10.0000 mg | ORAL_TABLET | Freq: Every day | ORAL | 1 refills | Status: DC

## 2018-04-08 ENCOUNTER — Ambulatory Visit: Payer: Commercial Managed Care - PPO | Admitting: Family Medicine

## 2018-04-15 ENCOUNTER — Ambulatory Visit: Payer: Commercial Managed Care - PPO | Admitting: Internal Medicine

## 2018-05-24 ENCOUNTER — Encounter: Payer: Self-pay | Admitting: Family Medicine

## 2018-05-24 MED ORDER — LEVOTHYROXINE SODIUM 112 MCG PO TABS: 112.0000 ug | ORAL_TABLET | Freq: Every day | ORAL | 1 refills | Status: DC

## 2018-05-24 MED ORDER — ROSUVASTATIN CALCIUM 10 MG PO TABS: 10.0000 mg | ORAL_TABLET | Freq: Every day | ORAL | 1 refills | Status: DC

## 2018-05-27 ENCOUNTER — Telehealth: Payer: Self-pay | Admitting: Internal Medicine

## 2018-05-27 NOTE — Telephone Encounter (Signed)
Called and spoke with pt regarding his HST results Pt was at his DOT appt and is needing to know if cpap machine is a requirement or a suggestion. CY please advise  No Known Allergies Current Outpatient Medications on File Prior to Visit  Medication Sig Dispense Refill  . aspirin EC 81 MG tablet Take 81 mg by mouth daily.    . indomethacin (INDOCIN) 50 MG capsule 1 tab TID at first sign of flare and as pain improves decrease to BID and then daily and then D/C as able (Patient not taking: Reported on 03/28/2018) 60 capsule 0  . levothyroxine (SYNTHROID, LEVOTHROID) 112 MCG tablet Take 1 tablet (112 mcg total) by mouth daily. 90 tablet 1  . meclizine (ANTIVERT) 25 MG tablet Take 1 tablet (25 mg total) by mouth 3 (three) times daily as needed for dizziness. (Patient not taking: Reported on 03/28/2018) 60 tablet 0  . Multiple Vitamins-Minerals (MULTIVITAMIN ADULT PO) Take by mouth.    . nitroGLYCERIN (NITROSTAT) 0.4 MG SL tablet Place 1 tablet (0.4 mg total) under the tongue every 5 (five) minutes as needed for chest pain. (Patient not taking: Reported on 03/28/2018) 30 tablet 0  . Omega-3 Fatty Acids (FISH OIL PO) Take by mouth.    . Probiotic Product (PROBIOTIC PO) Take by mouth.    . rosuvastatin (CRESTOR) 10 MG tablet Take 1 tablet (10 mg total) by mouth daily. 90 tablet 1   No current facility-administered medications on file prior to visit.

## 2018-05-27 NOTE — Telephone Encounter (Signed)
Patient advised on HST results is available for pick up; patient states he did not get CPAP through [facility], but have been using a CPAP machine that was given to him from a family member. Patient states he does not have medical insurance to cover for a CPAP machine. Pt contact # [phone].Marland Kitchen..Marland Kitchen

## 2018-05-27 NOTE — Telephone Encounter (Signed)
Left message for pt to call back. HST results are at front desk for pick up. Did patient get set up with CPAP through [facility]? If so, he needs to make and keep OV ASAP as he is moving out of the 90 day range for follow up and Insurance to cover.

## 2018-05-28 ENCOUNTER — Encounter: Payer: Self-pay | Admitting: Family Medicine

## 2018-05-28 MED ORDER — ROSUVASTATIN CALCIUM 10 MG PO TABS: 10.0000 mg | ORAL_TABLET | Freq: Every day | ORAL | 1 refills | Status: DC

## 2018-05-28 NOTE — Telephone Encounter (Signed)
He had mild OSA, averaging 8.3 apneas/ hour. 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 CPAP is a recommendation. We can help him get CPAP if he wants. At a minimum, he needs to lose some weight and sleep off the flat of his back. For some people that may be enough.

## 2018-05-28 NOTE — Telephone Encounter (Signed)
Spoke with pt. States that since he was seen for a DOT physical [facility] a recommendation and not required.  CY - please advise if you would be willing to write this letter. Thanks.

## 2018-05-28 NOTE — Telephone Encounter (Signed)
Attempted to call pt. I did not receive an answer. I have left a message for pt to return our call.  

## 2018-05-28 NOTE — Telephone Encounter (Signed)
Please advise? Could he possibly use simvastatin or atorvastatin? [facility]

## 2018-05-28 NOTE — Telephone Encounter (Signed)
Pt is returning call. Pt is in class until 12:15 and will be available to take call then. Cb is [phone].

## 2018-05-29 ENCOUNTER — Encounter: Payer: Self-pay | Admitting: Internal Medicine

## 2018-05-29 MED ORDER — SIMVASTATIN 40 MG PO TABS: 40.0000 mg | ORAL_TABLET | Freq: Every day | ORAL | 3 refills | Status: DC

## 2018-05-29 NOTE — Telephone Encounter (Signed)
Patient returned phone call to follow up on this message; pt contact # [phone]

## 2018-05-29 NOTE — Addendum Note (Signed)
Addended by: Sheliah HatchABORI, Prajwal Fellner E on: 05/29/2018 02:14 PM   Modules accepted: Orders

## 2018-05-29 NOTE — Telephone Encounter (Signed)
Letter printed and placed on CY cart.

## 2018-05-29 NOTE — Telephone Encounter (Signed)
I have written the letter. Please print a copy for me to sign tonight or tomorrow.

## 2018-05-29 NOTE — Telephone Encounter (Signed)
Spoke with pt, I advised him that we are awaiting a response from [facility]. Pt understood.

## 2018-06-03 NOTE — Telephone Encounter (Signed)
Letter and SS have been placed up front for pickup.   Left detailed message for patient.   Will close this encounter.

## 2018-06-09 ENCOUNTER — Encounter: Payer: Self-pay | Admitting: Family Medicine

## 2018-06-11 ENCOUNTER — Other Ambulatory Visit: Payer: Self-pay | Admitting: General Practice

## 2018-06-11 MED ORDER — SIMVASTATIN 40 MG PO TABS: 40.0000 mg | ORAL_TABLET | Freq: Every day | ORAL | 3 refills | Status: DC

## 2018-12-09 ENCOUNTER — Encounter: Payer: Self-pay | Admitting: Family Medicine

## 2018-12-09 MED ORDER — LEVOTHYROXINE SODIUM 112 MCG PO TABS: 112.0000 ug | ORAL_TABLET | Freq: Every day | ORAL | 1 refills | Status: DC

## 2018-12-09 NOTE — Telephone Encounter (Signed)
Please advise if ok to fill?  

## 2018-12-17 ENCOUNTER — Other Ambulatory Visit: Payer: Self-pay | Admitting: General Practice

## 2018-12-17 MED ORDER — SIMVASTATIN 40 MG PO TABS: 40.0000 mg | ORAL_TABLET | Freq: Every day | ORAL | 3 refills | Status: DC

## 2019-04-25 ENCOUNTER — Encounter: Payer: Self-pay | Admitting: Family Medicine

## 2019-04-25 ENCOUNTER — Other Ambulatory Visit: Payer: Self-pay

## 2019-04-25 ENCOUNTER — Ambulatory Visit (INDEPENDENT_AMBULATORY_CARE_PROVIDER_SITE_OTHER): Payer: BC Managed Care – PPO | Admitting: Family Medicine

## 2019-04-25 VITALS — BP 134/82 | HR 77 | Temp 97.1°F | Resp 16 | Ht 71.0 in | Wt 339.5 lb

## 2019-04-25 DIAGNOSIS — G8929 Other chronic pain: Secondary | ICD-10-CM

## 2019-04-25 DIAGNOSIS — Z125 Encounter for screening for malignant neoplasm of prostate: Secondary | ICD-10-CM | POA: Diagnosis not present

## 2019-04-25 DIAGNOSIS — M25512 Pain in left shoulder: Secondary | ICD-10-CM

## 2019-04-25 DIAGNOSIS — Z0000 Encounter for general adult medical examination without abnormal findings: Secondary | ICD-10-CM

## 2019-04-25 LAB — BASIC METABOLIC PANEL
BUN: 14 mg/dL (ref 6–23)
CO2: 30 mEq/L (ref 19–32)
Calcium: 9.3 mg/dL (ref 8.4–10.5)
Chloride: 101 mEq/L (ref 96–112)
Creatinine, Ser: 1.07 mg/dL (ref 0.40–1.50)
GFR: 74.65 mL/min (ref >60.00)
Glucose, Bld: 80 mg/dL (ref 70–99)
Potassium: 4.2 mEq/L (ref 3.5–5.1)
Sodium: 139 mEq/L (ref 135–145)

## 2019-04-25 LAB — CBC WITH DIFFERENTIAL/PLATELET
Basophils Absolute: 0 10*3/uL (ref 0.0–0.1)
Basophils Relative: 0.8 % (ref 0.0–3.0)
Eosinophils Absolute: 0.2 10*3/uL (ref 0.0–0.7)
Eosinophils Relative: 3.4 % (ref 0.0–5.0)
HCT: 45.4 % (ref 39.0–52.0)
Hemoglobin: 15.5 g/dL (ref 13.0–17.0)
Lymphocytes Relative: 28.3 % (ref 12.0–46.0)
Lymphs Abs: 1.8 10*3/uL (ref 0.7–4.0)
MCHC: 34.2 g/dL (ref 30.0–36.0)
MCV: 95.5 fl (ref 78.0–100.0)
Monocytes Absolute: 0.5 10*3/uL (ref 0.1–1.0)
Monocytes Relative: 7.5 % (ref 3.0–12.0)
Neutro Abs: 3.8 10*3/uL (ref 1.4–7.7)
Neutrophils Relative %: 60 % (ref 43.0–77.0)
Platelets: 183 10*3/uL (ref 150.0–400.0)
RBC: 4.75 Mil/uL (ref 4.22–5.81)
RDW: 13.4 % (ref 11.5–15.5)
WBC: 6.3 10*3/uL (ref 4.0–10.5)

## 2019-04-25 LAB — HEPATIC FUNCTION PANEL
ALT: 15 U/L (ref 0–53)
AST: 18 U/L (ref 0–37)
Albumin: 4.6 g/dL (ref 3.5–5.2)
Alkaline Phosphatase: 60 U/L (ref 39–117)
Bilirubin, Direct: 0.2 mg/dL (ref 0.0–0.3)
Total Bilirubin: 1.1 mg/dL (ref 0.2–1.2)
Total Protein: 7.1 g/dL (ref 6.0–8.3)

## 2019-04-25 LAB — LIPID PANEL
Cholesterol: 153 mg/dL (ref 0–200)
HDL: 59.5 mg/dL (ref >39.00)
LDL Cholesterol: 79 mg/dL (ref 0–99)
NonHDL: 93.19
Total CHOL/HDL Ratio: 3
Triglycerides: 73 mg/dL (ref 0.0–149.0)
VLDL: 14.6 mg/dL (ref 0.0–40.0)

## 2019-04-25 LAB — TSH: TSH: 1.81 u[IU]/mL (ref 0.35–4.50)

## 2019-04-25 LAB — PSA: PSA: 0.35 ng/mL (ref 0.10–4.00)

## 2019-04-25 LAB — HEMOGLOBIN A1C: Hgb A1c MFr Bld: 5.3 % (ref 4.6–6.5)

## 2019-04-25 NOTE — Patient Instructions (Signed)
Follow up in 6 months to recheck cholesterol We'll notify you of your lab results and make any changes if needed Continue to work on healthy diet and regular exercise- you can do it! Call with any questions or concerns Stay Safe!  Stay Sane!!

## 2019-04-25 NOTE — Progress Notes (Signed)
   Subjective:    Patient ID: Rodney Mcclain, male    DOB: 09-26-1976, 43 y.o.   MRN: [id number]  HPI CPE- UTD on Tdap, colonoscopy.  'feeling great'.   Review of Systems Patient reports no vision/hearing changes, anorexia, fever ,adenopathy, persistant/recurrent hoarseness, swallowing issues, chest pain, palpitations, edema, persistant/recurrent cough, hemoptysis, dyspnea (rest,exertional, paroxysmal nocturnal), gastrointestinal  bleeding (melena, rectal bleeding), abdominal pain, excessive heart burn, GU symptoms (dysuria, hematuria, voiding/incontinence issues) syncope, focal weakness, memory loss, numbness & tingling, skin/hair/nail changes, depression, anxiety, abnormal bruising/bleeding.  L shoulder pain- decreased ROM, pain. sxs started ~6-8 months ago    Objective:   Physical Exam General Appearance:    Alert, cooperative, no distress, appears stated age  Head:    Normocephalic, without obvious abnormality, atraumatic  Eyes:    PERRL, conjunctiva/corneas clear, EOM's intact, fundi    benign, both eyes       Ears:    Normal TM's and external ear canals, both ears  Nose:   Deferred due to COVID  Throat:   Neck:   Supple, symmetrical, trachea midline, no adenopathy;       thyroid:  No enlargement/tenderness/nodules  Back:     Symmetric, no curvature, ROM normal, no CVA tenderness  Lungs:     Clear to auscultation bilaterally, respirations unlabored  Chest wall:    No tenderness or deformity  Heart:    Regular rate and rhythm, S1 and S2 normal, no murmur, rub   or gallop  Abdomen:     Soft, non-tender, bowel sounds active all four quadrants,    no masses, no organomegaly  Genitalia:    Normal male without lesion, masses,discharge or tenderness  Rectal:    Deferred due to young age  Extremities:   Extremities normal, atraumatic, no cyanosis or edema  Pulses:   2+ and symmetric all extremities  Skin:   Skin color, texture, turgor normal, no rashes or lesions  Lymph nodes:    Cervical, supraclavicular, and axillary nodes normal  Neurologic:   CNII-XII intact. Normal strength, sensation and reflexes      throughout          Assessment & Plan:

## 2019-04-25 NOTE — Assessment & Plan Note (Signed)
Pt's PE WNL w/ exception of obesity.  Stressed need for healthy diet and regular exercise.  Check labs.  Anticipatory guidance provided.  

## 2019-04-25 NOTE — Assessment & Plan Note (Signed)
Deteriorated.  Pt has gained weight since last visit.  Stressed need for healthy diet and regular exercise.  Check labs to risk stratify.  Will follow. 

## 2019-05-01 ENCOUNTER — Ambulatory Visit: Payer: Self-pay

## 2019-05-01 ENCOUNTER — Ambulatory Visit (INDEPENDENT_AMBULATORY_CARE_PROVIDER_SITE_OTHER): Payer: BC Managed Care – PPO | Admitting: Orthopedic Surgery

## 2019-05-01 ENCOUNTER — Encounter: Payer: Self-pay | Admitting: Orthopedic Surgery

## 2019-05-01 DIAGNOSIS — M25512 Pain in left shoulder: Secondary | ICD-10-CM | POA: Diagnosis not present

## 2019-05-01 DIAGNOSIS — M7541 Impingement syndrome of right shoulder: Secondary | ICD-10-CM

## 2019-05-01 DIAGNOSIS — M25511 Pain in right shoulder: Secondary | ICD-10-CM

## 2019-05-01 MED ORDER — METHYLPREDNISOLONE ACETATE 40 MG/ML IJ SUSP
40.0000 mg | INTRAMUSCULAR | Status: AC | PRN
  Administered 2019-05-01: 19:00:00 40 mg via INTRA_ARTICULAR

## 2019-05-01 MED ORDER — LIDOCAINE HCL 1 % IJ SOLN
5.0000 mL | INTRAMUSCULAR | Status: AC | PRN
  Administered 2019-05-01: 19:00:00 5 mL

## 2019-05-01 MED ORDER — BUPIVACAINE HCL 0.5 % IJ SOLN
9.0000 mL | INTRAMUSCULAR | Status: AC | PRN
  Administered 2019-05-01: 19:00:00 9 mL via INTRA_ARTICULAR

## 2019-05-01 NOTE — Progress Notes (Signed)
Office Visit Note   Patient: Rodney Mcclain           Date of Birth: 14-Jan-1976           MRN: [id number] Visit Date: 05/01/2019 Requested by: Sheliah Hatchabori, [name], MD [address] [place] [id number] PCP: Sheliah Hatchabori, [name], MD  Subjective: Chief Complaint  Patient presents with   Right Shoulder - Pain   Left Shoulder - Pain    HPI: Rodney Mcclain is a patient with bilateral shoulder pain right slightly worse than left.  He is right-hand dominant.  He has had pain for about 2 years on the right-hand side and several months on the left.  Denies any numbness and tingling or radicular symptoms.  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  He is able to sleep on both sides.  Ports primarily deltoid pain with abduction on the right-hand side.  Is not much for taking medication or pills.              ROS: All systems reviewed are negative as they relate to the chief complaint within the history of present illness.  Patient denies  fevers or chills.   Assessment & Plan: Visit Diagnoses:  1. Bilateral shoulder pain, unspecified chronicity   2. Shoulder impingement syndrome, right     Plan: Impression is right shoulder impingement bursitis with no evidence of frozen shoulder rotator cuff deficiency or labral pathology.  Plan is subacromial injection today for diagnostic and therapeutic purposes along with physical therapy only 1-2 visits to work on below shoulder level rotator cuff strengthening.  Follow-up with me in 8 weeks and will decide then for or against further imaging  Follow-Up Instructions: Return in about 8 weeks (around 06/26/2019).   Orders:  Orders Placed This Encounter  Procedures   XR Shoulder Right   XR Shoulder Left   No orders of the defined types were placed in this encounter.     Procedures: Large Joint Inj: R subacromial bursa on 05/01/2019 7:06 PM Indications: diagnostic evaluation and pain Details: 18 G 1.5 in needle, posterior  approach  Arthrogram: No  Medications: 9 mL bupivacaine 0.5 %; 40 mg methylPREDNISolone acetate 40 MG/ML; 5 mL lidocaine 1 % Outcome: tolerated well, no immediate complications Procedure, treatment alternatives, risks and benefits explained, specific risks discussed. Consent was given by the patient. Immediately prior to procedure a time out was called to verify the correct patient, procedure, equipment, support staff and site/side marked as required. Patient was prepped and draped in the usual sterile fashion.       Clinical Data: No additional findings.  Objective: Vital Signs: There were no vitals taken for this visit.  Physical Exam:   Constitutional: Patient appears well-developed HEENT:  Head: Normocephalic Eyes:EOM are normal Neck: Normal range of motion Cardiovascular: Normal rate Pulmonary/chest: Effort normal Neurologic: Patient is alert Skin: Skin is warm Psychiatric: Patient has normal mood and affect    Ortho Exam: Ortho exam demonstrates good cervical spine range of motion.  Patient has 5 out of 5 grip EPL FPL interosseous wrist flexion extension bicep triceps and deltoid strength.  Radial pulse intact bilaterally.  No masses lymphadenopathy or skin changes noted in either shoulder girdle region.  No restriction of passive rotation or motion in either shoulder.  Rotator cuff strength is excellent to infraspinatus supraspinatus and subscap muscle testing.  Specialty Comments:  No specialty comments available.  Imaging: Xr Shoulder Left  Result Date: 05/01/2019 AP outlet  axillary left shoulder reviewed.  Acromiohumeral distance normal.  Slightly more AC joint space on the left compared to the right.  No glenohumeral joint arthritis.  Visualized lung fields normal.  Normal left shoulder  Xr Shoulder Right  Result Date: 05/01/2019 AP outlet axillary right shoulder reviewed.  Acromiohumeral distance normal.  No significant arthritis in the [facility] joint.  Minimal  enthesopathic changes of the rotator cuff insertion.  Visualized lung fields normal.  Normal right shoulder    PMFS History: Patient Active Problem List   Diagnosis Date Noted   Snoring 01/12/2018   Dyslipidemia 12/31/2017   Vertigo 03/29/2017   Gout flare 01/19/2014   General medical examination 03/03/2011   Morbid obesity ([place]) 01/31/2011   Anxiety and depression 01/31/2011   ANAL FISSURE 06/17/2009   ALLERGIC RHINITIS DUE TO POLLEN 01/14/2009   Essential hypertension 12/14/2008   BACK PAIN, THORACIC REGION 12/14/2008   Past Medical History:  Diagnosis Date   Anal fissure    Anxiety and depression    Depression    Diverticulitis    Gout    Hypertension    Obesity    OSA on CPAP     Family History  Problem Relation Age of Onset   Arthritis Mother    Heart disease Mother    Hypertension Mother    Diabetes Mother    Sleep apnea Mother    Arthritis Father    Kidney cancer Father    Arthritis Maternal Grandfather    Stroke Maternal Grandfather    Lung cancer Other        aunt   Hypertension Other        all grandparents   Diabetes Maternal Grandmother    Sleep apnea Sister    Sleep apnea Brother    Colon cancer Neg Hx    Colon polyps Neg Hx    Esophageal cancer Neg Hx    Gallbladder disease Neg Hx     Past Surgical History:  Procedure Laterality Date   HIP [place]   bilateral pin placement   TOE SURGERY     bilateral for bone spurs   Social History   Occupational History   Occupation: Production designer, theatre/television/film  Tobacco Use   Smoking status: Former Smoker    Packs/day: 1.50    Years: 18.00    Pack years: 27.00    Quit date: 10/09/2005    Years since quitting: 13.5   Smokeless tobacco: Never Used  Substance and Sexual Activity   Alcohol use: Yes    Alcohol/week: 0.0 standard drinks    Comment: occ.   Drug use: No   Sexual activity: Not on file

## 2019-05-31 ENCOUNTER — Other Ambulatory Visit: Payer: Self-pay | Admitting: Family Medicine

## 2019-06-05 ENCOUNTER — Other Ambulatory Visit: Payer: Self-pay | Admitting: Family Medicine

## 2019-07-01 ENCOUNTER — Other Ambulatory Visit: Payer: Self-pay

## 2019-07-01 ENCOUNTER — Encounter: Payer: Self-pay | Admitting: Physician Assistant

## 2019-07-01 ENCOUNTER — Ambulatory Visit: Payer: BC Managed Care – PPO | Admitting: Physician Assistant

## 2019-07-01 VITALS — BP 138/88 | HR 78 | Temp 98.5°F | Resp 16 | Ht 71.0 in | Wt 350.0 lb

## 2019-07-01 DIAGNOSIS — Z23 Encounter for immunization: Secondary | ICD-10-CM | POA: Diagnosis not present

## 2019-07-01 DIAGNOSIS — M7662 Achilles tendinitis, left leg: Secondary | ICD-10-CM

## 2019-07-01 MED ORDER — TRAMADOL HCL 50 MG PO TABS: 50.0000 mg | ORAL_TABLET | Freq: Two times a day (BID) | ORAL | 0 refills | Status: AC | PRN

## 2019-07-01 MED ORDER — MELOXICAM 15 MG PO TABS: 15.0000 mg | ORAL_TABLET | Freq: Every day | ORAL | 0 refills | Status: DC

## 2019-07-01 NOTE — Patient Instructions (Signed)
I want you to rest and elevate the leg while resting. Apply ice pack to the area for 10 minutes a few times per day. Take the Meloxicam once daily with food. Tramadol is for more severe breakthrough pain. Otherwise I want you to take tylenol. Get a heel lift at the pharmacy to help reposition the foot and aid in pain relief and to cut down on stress on the achilles tendon.   Can apply ace wrap to the area as well.  Follow-up if not resolving.   Achilles Tendinitis  Achilles tendinitis is inflammation of the tough, cord-like band that attaches the lower leg muscles to the heel bone (Achilles tendon). This is usually caused by overusing the tendon and the ankle joint. Achilles tendinitis usually gets better over time with treatment and caring for yourself at home. It can take weeks or months to heal completely. What are the causes? This condition may be caused by:  A sudden increase in exercise or activity, such as running.  Doing the same exercises or activities (such as jumping) over and over.  Not warming up calf muscles before exercising.  Exercising in shoes that are worn out or not made for exercise.  Having arthritis or a bone growth (spur) on the back of the heel bone. This can rub against the tendon and hurt it.  Age-related wear and tear. Tendons become less flexible with age and more likely to be injured. What are the signs or symptoms? Common symptoms of this condition include:  Pain in the Achilles tendon or in the back of the leg, just above the heel. The pain usually gets worse with exercise.  Stiffness or soreness in the back of the leg, especially in the morning.  Swelling of the skin over the Achilles tendon.  Thickening of the tendon.  Bone spurs at the bottom of the Achilles tendon, near the heel.  Trouble standing on tiptoe. How is this diagnosed? This condition is diagnosed based on your symptoms and a physical exam. You may have tests, including:   X-rays.  MRI. How is this treated? The goal of treatment is to relieve symptoms and help your injury heal. Treatment may include:  Decreasing or stopping activities that caused the tendinitis. This may mean switching to low-impact exercises like biking or swimming.  Icing the injured area.  Doing physical therapy, including strengthening and stretching exercises.  NSAIDs to help relieve pain and swelling.  Using supportive shoes, wraps, heel lifts, or a walking boot (air cast).  Surgery. This may be done if your symptoms do not improve after 6 months.  Using high-energy shock wave impulses to stimulate the healing process (extracorporeal shock wave therapy). This is rare.  Injection of medicines to help relieve inflammation (corticosteroids). This is rare. Follow these instructions at home: If you have an air cast:  Wear the cast as told by your health care provider. Remove it only as told by your health care provider.  Loosen the cast if your toes tingle, become numb, or turn cold and blue. Activity  Gradually return to your normal activities once your health care provider approves. Do not do activities that cause pain. ? Consider doing low-impact exercises, like cycling or swimming.  If you have an air cast, ask your health care provider when it is safe for you to drive.  If physical therapy was prescribed, do exercises as told by your health care provider or physical therapist. Managing pain, stiffness, and swelling   Raise (elevate) your  foot above the level of your heart while you are sitting or lying down.  Move your toes often to avoid stiffness and to lessen swelling.  If directed, put ice on the injured area: ? Put ice in a plastic bag. ? Place a towel between your skin and the bag. ? Leave the ice on for 20 minutes, 2-3 times a day General instructions  If directed, wrap your foot with an elastic bandage or other wrap. This can help keep your tendon from  moving too much while it heals. Your health care provider will show you how to wrap your foot correctly.  Wear supportive shoes or heel lifts only as told by your health care provider.  Take over-the-counter and prescription medicines only as told by your health care provider.  Keep all follow-up visits as told by your health care provider. This is important. Contact a health care provider if:  You have symptoms that gets worse.  You have pain that does not get better with medicine.  You develop new, unexplained symptoms.  You develop warmth and swelling in your foot.  You have a fever. Get help right away if:  You have a sudden popping sound or sensation in your Achilles tendon followed by severe pain.  You cannot move your toes or foot.  You cannot put any weight on your foot. Summary  Achilles tendinitis is inflammation of the tough, cord-like band that attaches the lower leg muscles to the heel bone (Achilles tendon).  This condition is usually caused by overusing the tendon and the ankle joint. It can also be caused by arthritis or normal aging.  The most common symptoms of this condition include pain, swelling, or stiffness in the Achilles tendon or in the back of the leg.  This condition is usually treated with rest, NSAIDs, and physical therapy. This information is not intended to replace advice given to you by your health care provider. Make sure you discuss any questions you have with your health care provider. Document Released: 07/05/2005 Document Revised: 09/07/2017 Document Reviewed: 08/14/2016 Elsevier Patient Education  2020 [employer].

## 2019-07-01 NOTE — Progress Notes (Signed)
Patient presents to clinic today c/o pain in the L heel starting Saturday. Patient notes significant pain in achilles region of heel with some pain on the bottom of foot although this is mild. Pain is worse with weight bearing and ambulation. Resting the foot helps symptoms. Patient notes certain shoes make this worse. Wears steel-toed boots daily at work. Denies any known trauma or injury. Notes some swelling of the heel. Denies leg swelling. Denies bruising or redness. Denies calf pain. Has never had anything like this before. Notes that heat to the area made symptoms worse.   Past Medical History:  Diagnosis Date  . Anal fissure   . Anxiety and depression   . Depression   . Diverticulitis   . Gout   . Hypertension   . Obesity   . OSA on CPAP     Current Outpatient Medications on File Prior to Visit  Medication Sig Dispense Refill  . EUTHYROX 112 MCG tablet Take 1 tablet by mouth once daily 90 tablet 0  . Omega-3 Fatty Acids (FISH OIL PO) Take by mouth.    . Probiotic Product (PROBIOTIC PO) Take by mouth.    . simvastatin (ZOCOR) 40 MG tablet TAKE 1 TABLET BY MOUTH AT BEDTIME 90 tablet 0   No current facility-administered medications on file prior to visit.     No Known Allergies  Family History  Problem Relation Age of Onset  . Arthritis Mother   . Heart disease Mother   . Hypertension Mother   . Diabetes Mother   . Sleep apnea Mother   . Arthritis Father   . Kidney cancer Father   . Arthritis Maternal Grandfather   . Stroke Maternal Grandfather   . Lung cancer Other        aunt  . Hypertension Other        all grandparents  . Diabetes Maternal Grandmother   . Sleep apnea Sister   . Sleep apnea Brother   . Colon cancer Neg Hx   . Colon polyps Neg Hx   . Esophageal cancer Neg Hx   . Gallbladder disease Neg Hx     Social History   Socioeconomic History  . Marital status: Married    Spouse name: Not on file  . Number of children: 1  . Years of education:  Not on file  . Highest education level: Not on file  Occupational History  . Occupation: Librarian, academic  Social Needs  . Financial resource strain: Not on file  . Food insecurity    Worry: Not on file    Inability: Not on file  . Transportation needs    Medical: Not on file    Non-medical: Not on file  Tobacco Use  . Smoking status: Former Smoker    Packs/day: 1.50    Years: 18.00    Pack years: 27.00    Quit date: 10/09/2005    Years since quitting: 13.7  . Smokeless tobacco: Never Used  Substance and Sexual Activity  . Alcohol use: Yes    Alcohol/week: 0.0 standard drinks    Comment: occ.  . Drug use: No  . Sexual activity: Not on file  Lifestyle  . Physical activity    Days per week: Not on file    Minutes per session: Not on file  . Stress: Not on file  Relationships  . Social Musician on phone: Not on file    Gets together: Not on file  Attends religious service: Not on file    Active member of club or organization: Not on file    Attends meetings of clubs or organizations: Not on file    Relationship status: Not on file  Other Topics Concern  . Not on file  Social History Narrative  . Not on file    Review of Systems - See HPI.  All other ROS are negative.  BP 138/88   Pulse 78   Temp 98.5 F (36.9 C) (Skin)   Resp 16   Ht 5\' 11"  (1.803 m)   Wt (!) 350 lb (158.8 kg)   SpO2 98%   BMI 48.82 kg/m   Physical Exam Vitals signs reviewed.  Constitutional:      Appearance: Normal appearance.  Pulmonary:     Effort: Pulmonary effort is normal.  Musculoskeletal:       Feet:  Neurological:     General: No focal deficit present.     Mental Status: He is alert and oriented to person, place, and time.     Recent Results (from the past 2160 hour(s))  Hemoglobin A1c     Status: None   Collection Time: 04/25/19  1:33 PM  Result Value Ref Range   Hgb A1c MFr Bld 5.3 4.6 - 6.5 %    Comment: Glycemic Control Guidelines for  People with Diabetes:Non Diabetic:  <6%Goal of Therapy: <7%Additional Action Suggested:  >8%   Lipid panel     Status: None   Collection Time: 04/25/19  1:33 PM  Result Value Ref Range   Cholesterol 153 0 - 200 mg/dL    Comment: ATP III Classification       Desirable:  < 200 mg/dL               Borderline High:  200 - 239 mg/dL          High:  > = 04/27/19 mg/dL   Triglycerides 812 0.0 - 149.0 mg/dL    Comment: Normal:  75.1 mg/dLBorderline High:  150 - 199 mg/dL   HDL <700 17.49 mg/dL   VLDL >44.96 0.0 - 75.9 mg/dL   LDL Cholesterol 79 0 - 99 mg/dL   Total CHOL/HDL Ratio 3     Comment:                Men          Women1/2 Average Risk     3.4          3.3Average Risk          5.0          4.42X Average Risk          9.6          7.13X Average Risk          15.0          11.0                       NonHDL 93.19     Comment: NOTE:  Non-HDL goal should be 30 mg/dL higher than patient's LDL goal (i.e. LDL goal of < 70 mg/dL, would have non-HDL goal of < 100 mg/dL)  Basic metabolic panel     Status: None   Collection Time: 04/25/19  1:33 PM  Result Value Ref Range   Sodium 139 135 - 145 mEq/L   Potassium 4.2 3.5 - 5.1 mEq/L   Chloride 101 96 - 112 mEq/L   CO2  30 19 - 32 mEq/L   Glucose, Bld 80 70 - 99 mg/dL   BUN 14 6 - 23 mg/dL   Creatinine, Ser 1.07 0.40 - 1.50 mg/dL   Calcium 9.3 8.4 - 10.5 mg/dL   GFR 74.65 >60.00 mL/min  TSH     Status: None   Collection Time: 04/25/19  1:33 PM  Result Value Ref Range   TSH 1.81 0.35 - 4.50 uIU/mL  Hepatic function panel     Status: None   Collection Time: 04/25/19  1:33 PM  Result Value Ref Range   Total Bilirubin 1.1 0.2 - 1.2 mg/dL   Bilirubin, Direct 0.2 0.0 - 0.3 mg/dL   Alkaline Phosphatase 60 39 - 117 U/L   AST 18 0 - 37 U/L   ALT 15 0 - 53 U/L   Total Protein 7.1 6.0 - 8.3 g/dL   Albumin 4.6 3.5 - 5.2 g/dL  CBC with Differential/Platelet     Status: None   Collection Time: 04/25/19  1:33 PM  Result Value Ref Range   WBC 6.3 4.0 - 10.5  K/uL   RBC 4.75 4.22 - 5.81 Mil/uL   Hemoglobin 15.5 13.0 - 17.0 g/dL   HCT 45.4 39.0 - 52.0 %   MCV 95.5 78.0 - 100.0 fl   MCHC 34.2 30.0 - 36.0 g/dL   RDW 13.4 11.5 - 15.5 %   Platelets 183.0 150.0 - 400.0 K/uL   Neutrophils Relative % 60.0 43.0 - 77.0 %   Lymphocytes Relative 28.3 12.0 - 46.0 %   Monocytes Relative 7.5 3.0 - 12.0 %   Eosinophils Relative 3.4 0.0 - 5.0 %   Basophils Relative 0.8 0.0 - 3.0 %   Neutro Abs 3.8 1.4 - 7.7 K/uL   Lymphs Abs 1.8 0.7 - 4.0 K/uL   Monocytes Absolute 0.5 0.1 - 1.0 K/uL   Eosinophils Absolute 0.2 0.0 - 0.7 K/uL   Basophils Absolute 0.0 0.0 - 0.1 K/uL  PSA     Status: None   Collection Time: 04/25/19  1:33 PM  Result Value Ref Range   PSA 0.35 0.10 - 4.00 ng/mL    Comment: Test performed using Access [employer] PSA Assay, a parmagnetic partical, chemiluminecent immunoassay.    Assessment/Plan: 1. Achilles tendinitis of left lower extremity Start Mobic. Get Heel lift for shoes. Wear supportive lace ups when able. Elevate feet while resting. Ice to the area a few times per day. Tramadol if needed for breakthrough pain. If not improving will need Sports medicine assessment.  - meloxicam (MOBIC) 15 MG tablet; Take 1 tablet (15 mg total) by mouth daily.  Dispense: 15 tablet; Refill: 0 - traMADol (ULTRAM) 50 MG tablet; Take 1 tablet (50 mg total) by mouth every 12 (twelve) hours as needed for up to 5 days.  Dispense: 10 tablet; Refill: 0  2. Need for immunization against influenza - Flu Vaccine QUAD 36+ mos IM   Leeanne Rio, PA-C

## 2019-07-17 DIAGNOSIS — D3142 Benign neoplasm of left ciliary body: Secondary | ICD-10-CM | POA: Diagnosis not present

## 2019-09-15 ENCOUNTER — Other Ambulatory Visit: Payer: Self-pay | Admitting: Family Medicine

## 2019-10-15 ENCOUNTER — Encounter: Payer: Self-pay | Admitting: Family Medicine

## 2019-10-15 ENCOUNTER — Other Ambulatory Visit: Payer: Self-pay

## 2019-10-15 ENCOUNTER — Ambulatory Visit (INDEPENDENT_AMBULATORY_CARE_PROVIDER_SITE_OTHER): Payer: BC Managed Care – PPO | Admitting: Family Medicine

## 2019-10-15 VITALS — BP 157/92 | HR 76 | Temp 97.9°F | Resp 16 | Ht 71.0 in | Wt 354.5 lb

## 2019-10-15 DIAGNOSIS — I10 Essential (primary) hypertension: Secondary | ICD-10-CM

## 2019-10-15 LAB — BASIC METABOLIC PANEL
BUN: 22 mg/dL (ref 6–23)
CO2: 30 mEq/L (ref 19–32)
Calcium: 9.4 mg/dL (ref 8.4–10.5)
Chloride: 101 mEq/L (ref 96–112)
Creatinine, Ser: 1.12 mg/dL (ref 0.40–1.50)
GFR: 70.67 mL/min (ref >60.00)
Glucose, Bld: 90 mg/dL (ref 70–99)
Potassium: 4.2 mEq/L (ref 3.5–5.1)
Sodium: 138 mEq/L (ref 135–145)

## 2019-10-15 MED ORDER — VALSARTAN 80 MG PO TABS: 80.0000 mg | ORAL_TABLET | Freq: Every day | ORAL | 3 refills | Status: DC

## 2019-10-15 NOTE — Progress Notes (Signed)
   Subjective:    Patient ID: Rodney Mcclain, male    DOB: 04-Aug-1976, 44 y.o.   MRN: [id number]  HPI HTN- pt has hx of HTN but has not been on medication recently.  Last night BP was 191/117.  This AM 172/111.  In office 157/92.  Pt reports increased stress recently- working 12-14 hrs/day.  Over the last few weeks has had worsening fatigue.  Has gained 5 lbs since last visit.  Previously on Metoprolol which lowered HR and BP too much causing excessive fatigue and dizziness.  No HAs, recent CP, SOB.  + swelling of hands/feet.  'there are days that my ring is tight'.     Review of Systems For ROS see HPI   This visit occurred during the SARS-CoV-2 public health emergency.  Safety protocols were in place, including screening questions prior to the visit, additional usage of staff PPE, and extensive cleaning of exam room while observing appropriate contact time as indicated for disinfecting solutions.       Objective:   Physical Exam Vitals reviewed.  Constitutional:      General: He is not in acute distress.    Appearance: He is well-developed. He is obese.  HENT:     Head: Normocephalic and atraumatic.  Eyes:     Conjunctiva/sclera: Conjunctivae normal.     Pupils: Pupils are equal, round, and reactive to light.  Neck:     Thyroid: No thyromegaly.  Cardiovascular:     Rate and Rhythm: Normal rate and regular rhythm.     Heart sounds: Normal heart sounds. No murmur.  Pulmonary:     Effort: Pulmonary effort is normal. No respiratory distress.     Breath sounds: Normal breath sounds.  Abdominal:     General: Bowel sounds are normal. There is no distension.     Palpations: Abdomen is soft.  Musculoskeletal:     Cervical back: Normal range of motion and neck supple.  Lymphadenopathy:     Cervical: No cervical adenopathy.  Skin:    General: Skin is warm and dry.  Neurological:     Mental Status: He is alert and oriented to person, place, and time.     Cranial Nerves: No cranial  nerve deficit.  Psychiatric:        Behavior: Behavior normal.           Assessment & Plan:

## 2019-10-15 NOTE — Assessment & Plan Note (Signed)
Deteriorated.  BP is quite elevated.  Check BMP as baseline and start Valsartan daily.  Encouraged low salt diet and increased water intake.  Will follow closely.

## 2019-10-15 NOTE — Patient Instructions (Signed)
Follow up as scheduled START the Valsartan daily for the blood pressure Try and limit your salt intake and increase your water intake Call with any questions or concerns Stay Healthy!  Stay Safe!

## 2019-10-31 ENCOUNTER — Other Ambulatory Visit: Payer: Self-pay

## 2019-10-31 ENCOUNTER — Ambulatory Visit: Payer: BC Managed Care – PPO | Admitting: Family Medicine

## 2019-10-31 ENCOUNTER — Encounter: Payer: Self-pay | Admitting: Family Medicine

## 2019-10-31 VITALS — BP 139/86 | HR 82 | Temp 98.1°F | Resp 16 | Ht 71.0 in | Wt 349.0 lb

## 2019-10-31 DIAGNOSIS — E039 Hypothyroidism, unspecified: Secondary | ICD-10-CM | POA: Insufficient documentation

## 2019-10-31 DIAGNOSIS — I10 Essential (primary) hypertension: Secondary | ICD-10-CM

## 2019-10-31 DIAGNOSIS — E785 Hyperlipidemia, unspecified: Secondary | ICD-10-CM | POA: Diagnosis not present

## 2019-10-31 LAB — HEPATIC FUNCTION PANEL
ALT: 18 U/L (ref 0–53)
AST: 21 U/L (ref 0–37)
Albumin: 4.5 g/dL (ref 3.5–5.2)
Alkaline Phosphatase: 65 U/L (ref 39–117)
Bilirubin, Direct: 0.1 mg/dL (ref 0.0–0.3)
Total Bilirubin: 0.7 mg/dL (ref 0.2–1.2)
Total Protein: 7.1 g/dL (ref 6.0–8.3)

## 2019-10-31 LAB — CBC WITH DIFFERENTIAL/PLATELET
Basophils Absolute: 0 10*3/uL (ref 0.0–0.1)
Basophils Relative: 0.5 % (ref 0.0–3.0)
Eosinophils Absolute: 0.2 10*3/uL (ref 0.0–0.7)
Eosinophils Relative: 2.2 % (ref 0.0–5.0)
HCT: 45.1 % (ref 39.0–52.0)
Hemoglobin: 15.3 g/dL (ref 13.0–17.0)
Lymphocytes Relative: 19.9 % (ref 12.0–46.0)
Lymphs Abs: 1.4 10*3/uL (ref 0.7–4.0)
MCHC: 34 g/dL (ref 30.0–36.0)
MCV: 95 fl (ref 78.0–100.0)
Monocytes Absolute: 0.5 10*3/uL (ref 0.1–1.0)
Monocytes Relative: 7.2 % (ref 3.0–12.0)
Neutro Abs: 4.9 10*3/uL (ref 1.4–7.7)
Neutrophils Relative %: 70.2 % (ref 43.0–77.0)
Platelets: 185 10*3/uL (ref 150.0–400.0)
RBC: 4.74 Mil/uL (ref 4.22–5.81)
RDW: 13.1 % (ref 11.5–15.5)
WBC: 7 10*3/uL (ref 4.0–10.5)

## 2019-10-31 LAB — TSH: TSH: 1.81 u[IU]/mL (ref 0.35–4.50)

## 2019-10-31 LAB — BASIC METABOLIC PANEL
BUN: 20 mg/dL (ref 6–23)
CO2: 27 mEq/L (ref 19–32)
Calcium: 9.7 mg/dL (ref 8.4–10.5)
Chloride: 101 mEq/L (ref 96–112)
Creatinine, Ser: 1.08 mg/dL (ref 0.40–1.50)
GFR: 73.68 mL/min (ref >60.00)
Glucose, Bld: 102 mg/dL — ABNORMAL HIGH (ref 70–99)
Potassium: 4.9 mEq/L (ref 3.5–5.1)
Sodium: 138 mEq/L (ref 135–145)

## 2019-10-31 LAB — LIPID PANEL
Cholesterol: 144 mg/dL (ref 0–200)
HDL: 55.1 mg/dL (ref >39.00)
LDL Cholesterol: 74 mg/dL (ref 0–99)
NonHDL: 88.66
Total CHOL/HDL Ratio: 3
Triglycerides: 72 mg/dL (ref 0.0–149.0)
VLDL: 14.4 mg/dL (ref 0.0–40.0)

## 2019-10-31 NOTE — Assessment & Plan Note (Signed)
Pt has lost 6 lbs since last visit.  Applauded his efforts.  Will continue to follow.

## 2019-10-31 NOTE — Assessment & Plan Note (Signed)
Improved since starting Valsartan.  Pt reports feeling better w/ improved BP.  Check labs to ensure normal Cr and K+.  No anticipated med changes.  Will follow.

## 2019-10-31 NOTE — Patient Instructions (Signed)
Schedule your complete physical in 6 months We'll notify you of your lab results and make any changes if needed Continue to work on healthy diet and regular exercise- you're down 6 lbs! Call with any questions or concerns Stay Safe!  Stay Healthy!

## 2019-10-31 NOTE — Assessment & Plan Note (Signed)
Ongoing issue.  Currently asymptomatic.  Check labs and adjust prn

## 2019-10-31 NOTE — Progress Notes (Signed)
   Subjective:    Patient ID: Rodney Mcclain, male    DOB: [birth year], 44 y.o.   MRN: [id number]  HPI HTN- pt was started on Valsartan 80mg  daily w/ improved BP.  Pt reports he could feel BP drop b/c he 'felt better'.  Fatigue improved.  Denies CP, SOB, HAs, visual changes.   Hyperlipidemia- chronic problem, on Simvastatin 40mg  daily.  No abd pain, N/V.  Hypothyroid- chronic problem, on Euthyrox daily.  Fatigue is improving w/ better BP control.  No changes to skin/hair/nails.  Obesity- pt is down 6 lbs since last visit.   Review of Systems For ROS see HPI   This visit occurred during the SARS-CoV-2 public health emergency.  Safety protocols were in place, including screening questions prior to the visit, additional usage of staff PPE, and extensive cleaning of exam room while observing appropriate contact time as indicated for disinfecting solutions.       Objective:   Physical Exam Vitals reviewed.  Constitutional:      General: He is not in acute distress.    Appearance: He is well-developed. He is obese.  HENT:     Head: Normocephalic and atraumatic.  Eyes:     Conjunctiva/sclera: Conjunctivae normal.     Pupils: Pupils are equal, round, and reactive to light.  Neck:     Thyroid: No thyromegaly.  Cardiovascular:     Rate and Rhythm: Normal rate and regular rhythm.     Heart sounds: Normal heart sounds. No murmur.  Pulmonary:     Effort: Pulmonary effort is normal. No respiratory distress.     Breath sounds: Normal breath sounds.  Abdominal:     General: Bowel sounds are normal. There is no distension.     Palpations: Abdomen is soft.  Musculoskeletal:     Cervical back: Normal range of motion and neck supple.  Lymphadenopathy:     Cervical: No cervical adenopathy.  Skin:    General: Skin is warm and dry.  Neurological:     Mental Status: He is alert and oriented to person, place, and time.     Cranial Nerves: No cranial nerve deficit.  Psychiatric:      Behavior: Behavior normal.           Assessment & Plan:

## 2019-10-31 NOTE — Assessment & Plan Note (Signed)
Chronic problem.  Tolerating statin w/o difficulty.  Check labs.  Adjust meds prn  

## 2019-12-23 ENCOUNTER — Other Ambulatory Visit: Payer: Self-pay | Admitting: Family Medicine

## 2020-01-26 ENCOUNTER — Ambulatory Visit: Payer: BC Managed Care – PPO | Admitting: Physician Assistant

## 2020-01-26 ENCOUNTER — Other Ambulatory Visit: Payer: Self-pay

## 2020-01-26 ENCOUNTER — Encounter: Payer: Self-pay | Admitting: Physician Assistant

## 2020-01-26 VITALS — BP 130/84 | HR 78 | Temp 98.3°F | Resp 16 | Ht 71.0 in | Wt 355.0 lb

## 2020-01-26 DIAGNOSIS — M7662 Achilles tendinitis, left leg: Secondary | ICD-10-CM

## 2020-01-26 DIAGNOSIS — I809 Phlebitis and thrombophlebitis of unspecified site: Secondary | ICD-10-CM | POA: Diagnosis not present

## 2020-01-26 MED ORDER — MELOXICAM 15 MG PO TABS: 15.0000 mg | ORAL_TABLET | Freq: Every day | ORAL | 0 refills | Status: DC

## 2020-01-26 MED ORDER — CEPHALEXIN 500 MG PO CAPS: 500.0000 mg | ORAL_CAPSULE | Freq: Two times a day (BID) | ORAL | 0 refills | Status: AC

## 2020-01-26 NOTE — Progress Notes (Signed)
Patient presents to clinic today c/o 1/2 days of pain, redness and tenderness at site of a varicose vein of upper left thigh.  Denies any trauma or injury to the area.  First noted when his dog jumped up on his lap.  Denies any scratch or others visible break in skin.  Notes the area is quite hot to touch.  Denies drainage from the area.  Denies leg swelling.  Denies fever, chills, malaise or fatigue.  Has not done anything for symptoms.  Patient also noting a flareup of his Achilles tendinitis of left heel.  Previously treated with meloxicam with resolution of symptoms for the past 6 months.  Symptoms recurred over the past few days.  Past Medical History:  Diagnosis Date  . Anal fissure   . Anxiety and depression   . Depression   . Diverticulitis   . Gout   . Hypertension   . Obesity   . OSA on CPAP     Current Outpatient Medications on File Prior to Visit  Medication Sig Dispense Refill  . EUTHYROX 112 MCG tablet Take 1 tablet by mouth once daily 90 tablet 0  . meloxicam (MOBIC) 15 MG tablet Take 1 tablet (15 mg total) by mouth daily. 15 tablet 0  . Omega-3 Fatty Acids (FISH OIL PO) Take by mouth.    . Probiotic Product (PROBIOTIC PO) Take by mouth.    . simvastatin (ZOCOR) 40 MG tablet TAKE 1 TABLET BY MOUTH AT BEDTIME 90 tablet 0  . valsartan (DIOVAN) 80 MG tablet Take 1 tablet (80 mg total) by mouth daily. 30 tablet 3   No current facility-administered medications on file prior to visit.    No Known Allergies  Family History  Problem Relation Age of Onset  . Arthritis Mother   . Heart disease Mother   . Hypertension Mother   . Diabetes Mother   . Sleep apnea Mother   . Arthritis Father   . Kidney cancer Father   . Arthritis Maternal Grandfather   . Stroke Maternal Grandfather   . Lung cancer Other        aunt  . Hypertension Other        all grandparents  . Diabetes Maternal Grandmother   . Sleep apnea Sister   . Sleep apnea Brother   . Colon cancer Neg Hx     . Colon polyps Neg Hx   . Esophageal cancer Neg Hx   . Gallbladder disease Neg Hx     Social History   Socioeconomic History  . Marital status: Married    Spouse name: Not on file  . Number of children: 1  . Years of education: Not on file  . Highest education level: Not on file  Occupational History  . Occupation: Librarian, academic  Tobacco Use  . Smoking status: Former Smoker    Packs/day: 1.50    Years: 18.00    Pack years: 27.00    Quit date: 10/09/2005    Years since quitting: 14.3  . Smokeless tobacco: Never Used  Substance and Sexual Activity  . Alcohol use: Yes    Alcohol/week: 0.0 standard drinks    Comment: occ.  . Drug use: No  . Sexual activity: Not on file  Other Topics Concern  . Not on file  Social History Narrative  . Not on file   Social Determinants of Health   Financial Resource Strain:   . Difficulty of Paying Living Expenses:   Food Insecurity:   .  Worried About Charity fundraiser in the Last Year:   . Arboriculturist in the Last Year:   Transportation Needs:   . Film/video editor (Medical):   Marland Kitchen Lack of Transportation (Non-Medical):   Physical Activity:   . Days of Exercise per Week:   . Minutes of Exercise per Session:   Stress:   . Feeling of Stress :   Social Connections:   . Frequency of Communication with Friends and Family:   . Frequency of Social Gatherings with Friends and Family:   . Attends Religious Services:   . Active Member of Clubs or Organizations:   . Attends Archivist Meetings:   Marland Kitchen Marital Status:    Review of Systems - See HPI.  All other ROS are negative.  BP 130/84   Pulse 78   Temp 98.3 F (36.8 C) (Temporal)   Resp 16   Ht 5\' 11"  (1.803 m)   Wt (!) 355 lb (161 kg)   SpO2 98%   BMI 49.51 kg/m   Physical Exam Vitals reviewed.  Constitutional:      Appearance: Normal appearance.  HENT:     Head: Normocephalic and atraumatic.  Skin:      Neurological:     Mental Status:  He is alert.     Recent Results (from the past 2160 hour(s))  Lipid panel     Status: None   Collection Time: 10/31/19 11:12 AM  Result Value Ref Range   Cholesterol 144 0 - 200 mg/dL    Comment: ATP III Classification       Desirable:  < 200 mg/dL               Borderline High:  200 - 239 mg/dL          High:  > = 240 mg/dL   Triglycerides 72.0 0.0 - 149.0 mg/dL    Comment: Normal:  <150 mg/dLBorderline High:  150 - 199 mg/dL   HDL 55.10 >39.00 mg/dL   VLDL 14.4 0.0 - 40.0 mg/dL   LDL Cholesterol 74 0 - 99 mg/dL   Total CHOL/HDL Ratio 3     Comment:                Men          Women1/2 Average Risk     3.4          3.3Average Risk          5.0          4.42X Average Risk          9.6          7.13X Average Risk          15.0          11.0                       NonHDL 88.66     Comment: NOTE:  Non-HDL goal should be 30 mg/dL higher than patient's LDL goal (i.e. LDL goal of < 70 mg/dL, would have non-HDL goal of < 100 mg/dL)  Basic metabolic panel     Status: Abnormal   Collection Time: 10/31/19 11:12 AM  Result Value Ref Range   Sodium 138 135 - 145 mEq/L   Potassium 4.9 3.5 - 5.1 mEq/L   Chloride 101 96 - 112 mEq/L   CO2 27 19 - 32 mEq/L   Glucose, Bld 102 (H) 70 -  99 mg/dL   BUN 20 6 - 23 mg/dL   Creatinine, Ser 3.01 0.40 - 1.50 mg/dL   GFR 60.10 >93.23 mL/min   Calcium 9.7 8.4 - 10.5 mg/dL  TSH     Status: None   Collection Time: 10/31/19 11:12 AM  Result Value Ref Range   TSH 1.81 0.35 - 4.50 uIU/mL  Hepatic function panel     Status: None   Collection Time: 10/31/19 11:12 AM  Result Value Ref Range   Total Bilirubin 0.7 0.2 - 1.2 mg/dL   Bilirubin, Direct 0.1 0.0 - 0.3 mg/dL   Alkaline Phosphatase 65 39 - 117 U/L   AST 21 0 - 37 U/L   ALT 18 0 - 53 U/L   Total Protein 7.1 6.0 - 8.3 g/dL   Albumin 4.5 3.5 - 5.2 g/dL  CBC with Differential/Platelet     Status: None   Collection Time: 10/31/19 11:12 AM  Result Value Ref Range   WBC 7.0 4.0 - 10.5 K/uL   RBC 4.74  4.22 - 5.81 Mil/uL   Hemoglobin 15.3 13.0 - 17.0 g/dL   HCT 55.7 32.2 - 02.5 %   MCV 95.0 78.0 - 100.0 fl   MCHC 34.0 30.0 - 36.0 g/dL   RDW 42.7 06.2 - 37.6 %   Platelets 185.0 150.0 - 400.0 K/uL   Neutrophils Relative % 70.2 43.0 - 77.0 %   Lymphocytes Relative 19.9 12.0 - 46.0 %   Monocytes Relative 7.2 3.0 - 12.0 %   Eosinophils Relative 2.2 0.0 - 5.0 %   Basophils Relative 0.5 0.0 - 3.0 %   Neutro Abs 4.9 1.4 - 7.7 K/uL   Lymphs Abs 1.4 0.7 - 4.0 K/uL   Monocytes Absolute 0.5 0.1 - 1.0 K/uL   Eosinophils Absolute 0.2 0.0 - 0.7 K/uL   Basophils Absolute 0.0 0.0 - 0.1 K/uL    Assessment/Plan: 1. Achilles tendinitis of left lower extremity Recurrent.  Restart meloxicam.  Resume supportive measures and proper footwear.  Referral to podiatry placed. - meloxicam (MOBIC) 15 MG tablet; Take 1 tablet (15 mg total) by mouth daily.  Dispense: 15 tablet; Refill: 0 - Ambulatory referral to Podiatry  2. Phlebitis Concern of starting cellulitis as well.  Meloxicam to help reduce inflammation.  Patient is to ice the area several times per day.  Elevate leg while resting.  Rx Keflex to cover infection of skin.  Strict return precautions reviewed with patient. - meloxicam (MOBIC) 15 MG tablet; Take 1 tablet (15 mg total) by mouth daily.  Dispense: 15 tablet; Refill: 0 - cephALEXin (KEFLEX) 500 MG capsule; Take 1 capsule (500 mg total) by mouth 2 (two) times daily for 7 days.  Dispense: 14 capsule; Refill: 0  Patient voiced understanding and agreement with plan  This visit occurred during the SARS-CoV-2 public health emergency.  Safety protocols were in place, including screening questions prior to the visit, additional usage of staff PPE, and extensive cleaning of exam room while observing appropriate contact time as indicated for disinfecting solutions.    Piedad Climes, PA-C

## 2020-01-26 NOTE — Patient Instructions (Signed)
Please elevate leg while resting. Ice the area. Take the Meloxicam once daily with food -- this will help with the leg and ankle. Take antibiotic as directed with food. Follow-up if symptoms are not resolving or if anything worsens.  For the recurrence of tendonitis I am setting you up with Podiatry.  You will be contacted by them to schedule appointment.  If you have not received a call within a week, let me know.

## 2020-02-08 ENCOUNTER — Encounter: Payer: Self-pay | Admitting: Family Medicine

## 2020-02-09 ENCOUNTER — Telehealth: Payer: Self-pay | Admitting: Family Medicine

## 2020-02-09 ENCOUNTER — Other Ambulatory Visit: Payer: Self-pay | Admitting: General Practice

## 2020-02-09 MED ORDER — VALSARTAN 80 MG PO TABS: 80.0000 mg | ORAL_TABLET | Freq: Every day | ORAL | 1 refills | Status: DC

## 2020-02-09 NOTE — Telephone Encounter (Signed)
Called and LMOVM to inform pt that is he is still having symptoms then he should call back and be scheduled.

## 2020-02-09 NOTE — Telephone Encounter (Signed)
Pt saw Malva Cogan on 01/26/2020 for phlebitis. Pt was told to call back if symptoms were not resolving. Pt asked if he needs to make another appointment to be seen. Please advise.

## 2020-02-10 ENCOUNTER — Other Ambulatory Visit: Payer: Self-pay

## 2020-02-11 ENCOUNTER — Ambulatory Visit: Payer: BC Managed Care – PPO | Admitting: Family Medicine

## 2020-02-11 ENCOUNTER — Encounter: Payer: Self-pay | Admitting: Family Medicine

## 2020-02-11 VITALS — BP 126/86 | HR 65 | Temp 98.0°F | Resp 16 | Ht 71.0 in | Wt 350.1 lb

## 2020-02-11 DIAGNOSIS — I809 Phlebitis and thrombophlebitis of unspecified site: Secondary | ICD-10-CM

## 2020-02-11 DIAGNOSIS — I83812 Varicose veins of left lower extremities with pain: Secondary | ICD-10-CM | POA: Diagnosis not present

## 2020-02-11 MED ORDER — MELOXICAM 15 MG PO TABS: 15.0000 mg | ORAL_TABLET | Freq: Every day | ORAL | 0 refills | Status: DC

## 2020-02-11 NOTE — Patient Instructions (Signed)
We'll notify you of your ultrasound results and determine the next steps RESTART the Meloxicam once daily- take w/ food Apply heat to the area Call with any questions or concerns Hang in there!

## 2020-02-11 NOTE — Progress Notes (Signed)
   Subjective:    Patient ID: Rodney Mcclain, male    DOB: 1976-02-03, 44 y.o.   MRN: [id number]  HPI L Leg Phlebitis- pt was seen on 4/19 and treated w/ Meloxicam and Keflex for concurrent cellulitis.  Continues to have pain w/ any type of pressure.  Some discomfort w/ walking.  Area is no longer red but remains warm to touch.  Area of warmth has decreased in size since last appt.   Review of Systems For ROS see HPI   This visit occurred during the SARS-CoV-2 public health emergency.  Safety protocols were in place, including screening questions prior to the visit, additional usage of staff PPE, and extensive cleaning of exam room while observing appropriate contact time as indicated for disinfecting solutions.       Objective:   Physical Exam Vitals reviewed.  Constitutional:      General: He is not in acute distress.    Appearance: Normal appearance. He is obese.  Cardiovascular:     Pulses: Normal pulses.     Comments: Painful varicose vein on L anterior thigh w/o obvious clot, no overlying redness, mildly TTP Musculoskeletal:        General: No swelling.  Skin:    General: Skin is warm and dry.     Findings: No erythema.  Neurological:     Mental Status: He is alert.           Assessment & Plan:  Painful Varicose Vein/Phlebitis- new to provider, ongoing for pt.  + family hx of 'awful veins'.  Will get [place] to assess for DVT.  If DVT will need anticoagulation.  If no DVT will need vascular referral.  Restart Meloxicam, apply heat.  Reviewed supportive care and red flags that should prompt return.  Pt expressed understanding and is in agreement w/ plan.

## 2020-02-13 ENCOUNTER — Other Ambulatory Visit: Payer: Self-pay

## 2020-02-13 ENCOUNTER — Ambulatory Visit (HOSPITAL_BASED_OUTPATIENT_CLINIC_OR_DEPARTMENT_OTHER)
Admission: RE | Admit: 2020-02-13 | Discharge: 2020-02-13 | Disposition: A | Discharge status: Home / Self Care | Payer: BC Managed Care – PPO | Source: Ambulatory Visit | Attending: Family Medicine | Admitting: Family Medicine

## 2020-02-13 DIAGNOSIS — I83812 Varicose veins of left lower extremities with pain: Secondary | ICD-10-CM | POA: Insufficient documentation

## 2020-02-13 DIAGNOSIS — I809 Phlebitis and thrombophlebitis of unspecified site: Secondary | ICD-10-CM | POA: Insufficient documentation

## 2020-02-26 ENCOUNTER — Other Ambulatory Visit: Payer: Self-pay

## 2020-02-26 ENCOUNTER — Ambulatory Visit (INDEPENDENT_AMBULATORY_CARE_PROVIDER_SITE_OTHER): Payer: BC Managed Care – PPO

## 2020-02-26 ENCOUNTER — Ambulatory Visit: Payer: BC Managed Care – PPO | Admitting: Podiatry

## 2020-02-26 ENCOUNTER — Encounter: Payer: Self-pay | Admitting: Podiatry

## 2020-02-26 ENCOUNTER — Other Ambulatory Visit: Payer: Self-pay | Admitting: Podiatry

## 2020-02-26 DIAGNOSIS — M722 Plantar fascial fibromatosis: Secondary | ICD-10-CM

## 2020-02-26 DIAGNOSIS — M7662 Achilles tendinitis, left leg: Secondary | ICD-10-CM | POA: Diagnosis not present

## 2020-02-26 MED ORDER — MELOXICAM 15 MG PO TABS
15.0000 mg | ORAL_TABLET | Freq: Every day | ORAL | 3 refills | Status: DC
Start: 2020-02-26 — End: 2020-06-19

## 2020-02-26 MED ORDER — METHYLPREDNISOLONE 4 MG PO TBPK
ORAL_TABLET | ORAL | 0 refills | Status: DC
Start: 2020-02-26 — End: 2020-04-06

## 2020-02-26 NOTE — Progress Notes (Signed)
Subjective:  Patient ID: Rodney Mcclain, male    DOB: [birth year],  MRN: [id number] HPI Chief Complaint  Patient presents with  . Foot Pain    Posterior heel left - aching x 4 months, pulling, PCP Rx'd meloxicam and it seems to be okay right now  . New Patient (Initial Visit)    44 y.o. male presents with the above complaint.   ROS: Denies fever chills nausea vomiting muscle aches pains calf pain back pain chest pain shortness of breath.  Past Medical History:  Diagnosis Date  . Anal fissure   . Anxiety and depression   . Depression   . Diverticulitis   . Gout   . Hypertension   . Obesity   . OSA on CPAP    Past Surgical History:  Procedure Laterality Date  . HIP SURGERY  1986 & 1991   bilateral pin placement  . TOE SURGERY     bilateral for bone spurs    Current Outpatient Medications:  .  EUTHYROX 112 MCG tablet, Take 1 tablet by mouth once daily, Disp: 90 tablet, Rfl: 0 .  meloxicam (MOBIC) 15 MG tablet, Take 1 tablet (15 mg total) by mouth daily., Disp: 30 tablet, Rfl: 3 .  methylPREDNISolone (MEDROL DOSEPAK) 4 MG TBPK tablet, 6 day dose pack - take as directed, Disp: 21 tablet, Rfl: 0 .  Omega-3 Fatty Acids (FISH OIL PO), Take by mouth., Disp: , Rfl:  .  Probiotic Product (PROBIOTIC PO), Take by mouth., Disp: , Rfl:  .  simvastatin (ZOCOR) 40 MG tablet, TAKE 1 TABLET BY MOUTH AT BEDTIME, Disp: 90 tablet, Rfl: 0 .  valsartan (DIOVAN) 80 MG tablet, Take 1 tablet (80 mg total) by mouth daily., Disp: 90 tablet, Rfl: 1  No Known Allergies Review of Systems Objective:  There were no vitals filed for this visit.  General: Well developed, nourished, in no acute distress, alert and oriented x3   Dermatological: Skin is warm, dry and supple bilateral. Nails x 10 are well maintained; remaining integument appears unremarkable at this time. There are no open sores, no preulcerative lesions, no rash or signs of infection present.  Vascular: Dorsalis Pedis artery and Posterior  Tibial artery pedal pulses are 2/4 bilateral with immedate capillary fill time. Pedal hair growth present. No varicosities and no lower extremity edema present bilateral.   Neruologic: Grossly intact via light touch bilateral. Vibratory intact via tuning fork bilateral. Protective threshold with Semmes Wienstein monofilament intact to all pedal sites bilateral. Patellar and Achilles deep tendon reflexes 2+ bilateral. No Babinski or clonus noted bilateral.   Musculoskeletal: No gross boney pedal deformities bilateral. No pain, crepitus, or limitation noted with foot and ankle range of motion bilateral. Muscular strength 5/5 in all groups tested bilateral.  The majority of his tenderness is on palpation medial condyle of the calcaneus at the plantar pressure calcaneal insertion site.  He has some tenderness on palpation of the Achilles at the posterior aspect of the calcaneus at its insertion site.  There is no erythema edema cellulitis drainage or odor.  No fluctuance.  Gait: Unassisted, Nonantalgic.    Radiographs:  Osseously mature individual left foot with hammertoe deformities and hallux interphalangeal osteoarthritis.  He has a plantar distally oriented calcaneal heel spur no Achilles thickening.  Plantar fashion calcaneal insertion site does demonstrate soft tissue increase in density indicative of plantar fasciitis.  Assessment & Plan:   Assessment: Plantar fasciitis with compensatory Achilles tendinitis left.  Plan: Discussed etiology pathology conservative versus  surgical therapies at this point I injected the plantar fascia with 20 mg Kenalog 5 mg Marcaine point maximal tenderness.  He tolerated that well without complications.  Started him on a Medrol Dosepak to be followed by meloxicam.  Placed in a plantar fascial brace and a night splint.  Discussed appropriate shoe gear stretching exercises ice therapy and shoe gear modifications I will follow-up with him in 1 month     [name]  [place], [place]

## 2020-02-26 NOTE — Patient Instructions (Signed)

## 2020-03-26 ENCOUNTER — Other Ambulatory Visit: Payer: Self-pay | Admitting: Family Medicine

## 2020-04-06 ENCOUNTER — Ambulatory Visit: Payer: BC Managed Care – PPO | Admitting: Podiatry

## 2020-04-06 ENCOUNTER — Encounter: Payer: Self-pay | Admitting: Podiatry

## 2020-04-06 ENCOUNTER — Other Ambulatory Visit: Payer: Self-pay

## 2020-04-06 DIAGNOSIS — M722 Plantar fascial fibromatosis: Secondary | ICD-10-CM | POA: Diagnosis not present

## 2020-04-06 NOTE — Progress Notes (Signed)
He presents today for follow-up of his plantar fasciitis states that he was on vacation last week walking on the sand wearing flip-flops he did not wear his brace at nighttime.  He states that he was doing very well has not had any pain.  Continues to take his meloxicam regularly.  Objective: Vital signs are stable he is alert oriented x3 there is no erythema edema cellulitis drainage or odor.  Pulses are palpable.  He has some pain on palpation of the medial calcaneal tubercle and central calcaneal tubercle of the left heel.  Assessment: Plantar fasciitis left resolving about 80 to 85%.  Plan: Discussed etiology pathology conservative versus surgical therapies at this point I went ahead and injected his heel once again today 20 mg of Kenalog 5 mg Marcaine point of maximal tenderness.  Tolerated procedure well without complications.  He will continue his meloxicam.  And he will continue his plantar fascial brace he will continue to do so until he is 100% well +25-month.  If he does not reach that he will notify us.  I will follow-up with him for possible orthotics.

## 2020-04-10 ENCOUNTER — Other Ambulatory Visit: Payer: Self-pay | Admitting: Family Medicine

## 2020-05-10 ENCOUNTER — Encounter: Payer: BC Managed Care – PPO | Admitting: Family Medicine

## 2020-06-05 DIAGNOSIS — Z20822 Contact with and (suspected) exposure to covid-19: Secondary | ICD-10-CM | POA: Diagnosis not present

## 2020-06-07 ENCOUNTER — Other Ambulatory Visit: Payer: Self-pay

## 2020-06-07 ENCOUNTER — Telehealth: Payer: Self-pay | Admitting: Family

## 2020-06-07 ENCOUNTER — Encounter: Payer: Self-pay | Admitting: Family Medicine

## 2020-06-07 ENCOUNTER — Telehealth (INDEPENDENT_AMBULATORY_CARE_PROVIDER_SITE_OTHER): Payer: BC Managed Care – PPO | Admitting: Family Medicine

## 2020-06-07 DIAGNOSIS — Z20822 Contact with and (suspected) exposure to covid-19: Secondary | ICD-10-CM

## 2020-06-07 MED ORDER — PROAIR RESPICLICK 108 (90 BASE) MCG/ACT IN AEPB: 2.0000 | INHALATION_SPRAY | Freq: Four times a day (QID) | RESPIRATORY_TRACT | 1 refills | Status: DC | PRN

## 2020-06-07 NOTE — Progress Notes (Signed)
Virtual Visit via Video   I connected with patient on 06/07/20 at 11:30 AM EDT by a video enabled telemedicine application and verified that I am speaking with the correct person using two identifiers.  Location patient: Home Location provider: [place], Office Persons participating in the virtual visit: Patient, Provider, CMA (Jess B)  I discussed the limitations of evaluation and management by telemedicine and the availability of in person appointments. The patient expressed understanding and agreed to proceed.  Subjective:   HPI:   COVID exposure- daughter has known exposure and is also symptomatic.  Friday had to walk across the building at work and felt winded.  Since then has had 'shallow breathing', winded w/ any activity.  Feeling SOB even at rest.  [facility]2 sats this AM 98% at [facility].  Had COVID testing on Saturday and this AM- both rapid tests.  Friday night had subjective fever.  Minimal cough.  Denies sore throat, no body aches.  Was unable to smell or taste.  + diarrhea.    ROS:   See pertinent positives and negatives per HPI.  Patient Active Problem List   Diagnosis Date Noted  . Hypothyroid 10/31/2019  . Snoring 01/12/2018  . Dyslipidemia 12/31/2017  . Vertigo 03/29/2017  . Gout flare 01/19/2014  . General medical examination 03/03/2011  . Morbid obesity (HCC) 01/31/2011  . Anxiety and depression 01/31/2011  . ANAL FISSURE 06/17/2009  . ALLERGIC RHINITIS DUE TO POLLEN 01/14/2009  . Essential hypertension 12/14/2008  . BACK PAIN, THORACIC REGION 12/14/2008    Social History   Tobacco Use  . Smoking status: Former Smoker    Packs/day: 1.50    Years: 18.00    Pack years: 27.00    Quit date: 10/09/2005    Years since quitting: 14.6  . Smokeless tobacco: Never Used  Substance Use Topics  . Alcohol use: Yes    Alcohol/week: 0.0 standard drinks    Comment: occ.    Current Outpatient Medications:  .  EUTHYROX 112 MCG tablet, Take 1 tablet by mouth once  daily, Disp: 90 tablet, Rfl: 0 .  meloxicam (MOBIC) 15 MG tablet, Take 1 tablet (15 mg total) by mouth daily., Disp: 30 tablet, Rfl: 3 .  Omega-3 Fatty Acids (FISH OIL PO), Take by mouth., Disp: , Rfl:  .  Probiotic Product (PROBIOTIC PO), Take by mouth., Disp: , Rfl:  .  simvastatin (ZOCOR) 40 MG tablet, TAKE 1 TABLET BY MOUTH AT BEDTIME, Disp: 90 tablet, Rfl: 0 .  valsartan (DIOVAN) 80 MG tablet, Take 1 tablet (80 mg total) by mouth daily., Disp: 90 tablet, Rfl: 1  No Known Allergies  Objective:   There were no vitals taken for this visit. AAOx3, NAD NCAT, EOMI No obvious CN deficits Pale  Pt is able to speak clearly, coherently without shortness of breath or increased work of breathing.  Thought process is linear.  Mood is appropriate.   Assessment and Plan:   Suspected COVID- new.  Pt's daughter had known exposure and then developed sxs.  Pt subsequently developed sxs consistent w/ COVID- SOB, loss of taste/smell, diarrhea, fever/chills.  He has had 2 negative rapid tests but I suspect those are not accurate.  Encouraged him to get a PCR test ASAP.  O2 was 98% this AM at [facility] but he reports SOB at rest.  Will start albuterol inhaler, encouraged rest, fluids, Vit D/C/Zinc.  Given his medical comorbidities will call the antibody infusion center and see how they want to proceed given  that he doesn't have a formal dx.  Reviewed supportive care and red flags that should prompt return.  Pt expressed understanding and is in agreement w/ plan.   Neena Rhymes, MD 06/07/2020

## 2020-06-07 NOTE — Progress Notes (Signed)
I have discussed the procedure for the virtual visit with the patient who has given consent to proceed with assessment and treatment.   Pt unable to obtain vitals.   Derius Ghosh L Konnar Ben, CMA     

## 2020-06-07 NOTE — Telephone Encounter (Signed)
Called to Discuss with patient about Covid symptoms and the use of the monoclonal antibody infusion for those with mild to moderate Covid symptoms and at a high risk of hospitalization.     Pt appears to qualify for this infusion due to co-morbid conditions and/or a member of an at-risk group in accordance with the FDA Emergency Use Authorization.   Rodney Mcclain was seen in video consultation this morning with symptom onset being 06/04/20 including shortness of breath and feeling winded with changes to smell/taste and also diarrhea. He has had two negative tests thus far and would need a positive test to qualify for treatment. He can be infused through 9/6 if he were to have a positive test. Risk factors include BMI >25 and hypertension.   Message left on voicemail and sent on MyChart.   [name], NP

## 2020-06-08 ENCOUNTER — Telehealth: Payer: Self-pay

## 2020-06-08 ENCOUNTER — Encounter (INDEPENDENT_AMBULATORY_CARE_PROVIDER_SITE_OTHER): Payer: Self-pay

## 2020-06-08 ENCOUNTER — Other Ambulatory Visit: Payer: Self-pay | Admitting: General Practice

## 2020-06-08 ENCOUNTER — Encounter: Payer: Self-pay | Admitting: Family Medicine

## 2020-06-08 MED ORDER — ALBUTEROL SULFATE HFA 108 (90 BASE) MCG/ACT IN AERS: 2.0000 | INHALATION_SPRAY | Freq: Four times a day (QID) | RESPIRATORY_TRACT | Status: DC | PRN

## 2020-06-08 NOTE — Telephone Encounter (Signed)
Patient states that his cough has gotten worse and that he has been treating with Theraflu. Patient will continue to monitor and agrees with care plan.    If cough remains the same or better: continue to treat with over the counter medications. Hard candy or cough drops and drinking warm fluids. Adults can also use honey 2 tsp (10 ML) at bedtime.   HONEY IS NOT RECOMMENDED FOR INFANTS UNDER ONE.   If cough is becoming worse even with the use of over the counter medications and patient is not able to sleep at night, cough becomes productive with sputum that maybe yellow or green in color, contact PCP.

## 2020-06-09 ENCOUNTER — Telehealth: Payer: Self-pay

## 2020-06-09 NOTE — Telephone Encounter (Signed)
Pt. Noted on COVID 19 questionnare that cough was worse today. Left message to call back and discuss symptoms.

## 2020-06-09 NOTE — Telephone Encounter (Signed)
Tried to reach pt. Again. Left another message. Also reached out through My Chart [facility]ion. Pt. Had noted cough was worse today. Will forward to the practice.

## 2020-06-09 NOTE — Telephone Encounter (Signed)
FYI

## 2020-06-11 ENCOUNTER — Telehealth: Payer: Self-pay

## 2020-06-11 NOTE — Telephone Encounter (Signed)
FYI,

## 2020-06-11 NOTE — Telephone Encounter (Signed)
Attempted to contact patient My chart Covid-19 questionnaire indicates new of diarrhea.  Worsening weakness and appetite. Left VM for him to go to ER for s/s of dehydration including weakness, dizziness and headache. He was also told to reach out to his PCP for additional problems. Nurse line number was left for patient. Will route note to office for follow up.

## 2020-06-14 ENCOUNTER — Emergency Department (HOSPITAL_BASED_OUTPATIENT_CLINIC_OR_DEPARTMENT_OTHER)
Admission: EM | Admit: 2020-06-14 | Discharge: 2020-06-14 | Disposition: A | Discharge status: Home / Self Care | Payer: BC Managed Care – PPO | Attending: Emergency Medicine | Admitting: Emergency Medicine

## 2020-06-14 ENCOUNTER — Emergency Department (HOSPITAL_BASED_OUTPATIENT_CLINIC_OR_DEPARTMENT_OTHER): Payer: BC Managed Care – PPO

## 2020-06-14 ENCOUNTER — Encounter (HOSPITAL_BASED_OUTPATIENT_CLINIC_OR_DEPARTMENT_OTHER): Payer: Self-pay | Admitting: *Deleted

## 2020-06-14 ENCOUNTER — Other Ambulatory Visit: Payer: Self-pay

## 2020-06-14 DIAGNOSIS — Z79899 Other long term (current) drug therapy: Secondary | ICD-10-CM | POA: Insufficient documentation

## 2020-06-14 DIAGNOSIS — R05 Cough: Secondary | ICD-10-CM | POA: Diagnosis not present

## 2020-06-14 DIAGNOSIS — E039 Hypothyroidism, unspecified: Secondary | ICD-10-CM | POA: Insufficient documentation

## 2020-06-14 DIAGNOSIS — Z6841 Body Mass Index (BMI) 40.0 and over, adult: Secondary | ICD-10-CM | POA: Diagnosis not present

## 2020-06-14 DIAGNOSIS — I10 Essential (primary) hypertension: Secondary | ICD-10-CM | POA: Diagnosis not present

## 2020-06-14 DIAGNOSIS — Z87891 Personal history of nicotine dependence: Secondary | ICD-10-CM | POA: Insufficient documentation

## 2020-06-14 DIAGNOSIS — U071 COVID-19: Secondary | ICD-10-CM

## 2020-06-14 DIAGNOSIS — E669 Obesity, unspecified: Secondary | ICD-10-CM | POA: Insufficient documentation

## 2020-06-14 DIAGNOSIS — R0602 Shortness of breath: Secondary | ICD-10-CM | POA: Diagnosis not present

## 2020-06-14 LAB — CBC WITH DIFFERENTIAL/PLATELET
Abs Immature Granulocytes: 0.02 K/uL (ref 0.00–0.07)
Basophils Absolute: 0 K/uL (ref 0.0–0.1)
Basophils Relative: 0 %
Eosinophils Absolute: 0 K/uL (ref 0.0–0.5)
Eosinophils Relative: 1 %
HCT: 47.4 % (ref 39.0–52.0)
Hemoglobin: 16.3 g/dL (ref 13.0–17.0)
Immature Granulocytes: 0 %
Lymphocytes Relative: 16 %
Lymphs Abs: 0.9 K/uL (ref 0.7–4.0)
MCH: 32.5 pg (ref 26.0–34.0)
MCHC: 34.4 g/dL (ref 30.0–36.0)
MCV: 94.4 fL (ref 80.0–100.0)
Monocytes Absolute: 0.5 K/uL (ref 0.1–1.0)
Monocytes Relative: 10 %
Neutro Abs: 4 K/uL (ref 1.7–7.7)
Neutrophils Relative %: 73 %
Platelets: 128 K/uL — ABNORMAL LOW (ref 150–400)
RBC: 5.02 MIL/uL (ref 4.22–5.81)
RDW: 12.9 % (ref 11.5–15.5)
WBC: 5.5 K/uL (ref 4.0–10.5)
nRBC: 0 % (ref 0.0–0.2)

## 2020-06-14 LAB — BASIC METABOLIC PANEL
Anion gap: 10 (ref 5–15)
BUN: 16 mg/dL (ref 6–20)
CO2: 24 mmol/L (ref 22–32)
Calcium: 8.7 mg/dL — ABNORMAL LOW (ref 8.9–10.3)
Chloride: 101 mmol/L (ref 98–111)
Creatinine, Ser: 1.28 mg/dL — ABNORMAL HIGH (ref 0.61–1.24)
GFR calc Af Amer: >60 mL/min (ref >60)
GFR calc non Af Amer: >60 mL/min (ref >60)
Glucose, Bld: 103 mg/dL — ABNORMAL HIGH (ref 70–99)
Potassium: 4.2 mmol/L (ref 3.5–5.1)
Sodium: 135 mmol/L (ref 135–145)

## 2020-06-14 LAB — SARS CORONAVIRUS 2 BY RT PCR (HOSPITAL ORDER, PERFORMED IN ~~LOC~~ HOSPITAL LAB): SARS Coronavirus 2: POSITIVE — AB

## 2020-06-14 MED ORDER — BENZONATATE 100 MG PO CAPS: 100.0000 mg | ORAL_CAPSULE | Freq: Three times a day (TID) | ORAL | 0 refills | Status: DC

## 2020-06-14 MED ORDER — SODIUM CHLORIDE 0.9 % IV SOLN
1.0000 g | Freq: Once | INTRAVENOUS | Status: AC
  Administered 2020-06-14: 1 g via INTRAVENOUS
  Filled 2020-06-14: qty 10

## 2020-06-14 MED ORDER — DOXYCYCLINE HYCLATE 100 MG PO TABS: 100.0000 mg | ORAL_TABLET | Freq: Two times a day (BID) | ORAL | 0 refills | Status: DC

## 2020-06-14 NOTE — ED Provider Notes (Signed)
[facility] EMERGENCY DEPARTMENT Provider Note   CSN: [id number] Arrival date & time: 06/14/20  1001     History Chief Complaint  Patient presents with  . Shortness of Breath    Rodney Mcclain is a 44 y.o. male.  HPI   Patient presents to the ED with complaints of cough, fever, congestion, body aches and loose stools.  Patient states the symptoms started about a week ago.  He called his doctor and was instructed to go to an urgent care.  Patient states he has had 3 - Covid test in the past week.  To wear rapid and then he says he also had a PCR test earlier this week.  The last test was on Monday.  Patient symptoms have persisted.  This morning his oxygen level was in the low 90s.  He still was coughing.  Over the weekend he had a temperature up to 101.  He denies any sore throat.  He is not having any trouble with dysuria.  No abdominal pain.  Past Medical History:  Diagnosis Date  . Anal fissure   . Anxiety and depression   . Depression   . Diverticulitis   . Gout   . Hypertension   . Obesity   . OSA on CPAP     Patient Active Problem List   Diagnosis Date Noted  . Hypothyroid 10/31/2019  . Snoring 01/12/2018  . Dyslipidemia 12/31/2017  . Vertigo 03/29/2017  . Gout flare 01/19/2014  . General medical examination 03/03/2011  . Morbid obesity (HCC) 01/31/2011  . Anxiety and depression 01/31/2011  . ANAL FISSURE 06/17/2009  . ALLERGIC RHINITIS DUE TO POLLEN 01/14/2009  . Essential hypertension 12/14/2008  . BACK PAIN, THORACIC REGION 12/14/2008    Past Surgical History:  Procedure Laterality Date  . HIP SURGERY  1986 & 1991   bilateral pin placement  . TOE SURGERY     bilateral for bone spurs       Family History  Problem Relation Age of Onset  . Arthritis Mother   . Heart disease Mother   . Hypertension Mother   . Diabetes Mother   . Sleep apnea Mother   . Arthritis Father   . Kidney cancer Father   . Arthritis Maternal Grandfather   . Stroke  Maternal Grandfather   . Lung cancer Other        aunt  . Hypertension Other        all grandparents  . Diabetes Maternal Grandmother   . Sleep apnea Sister   . Sleep apnea Brother   . Colon cancer Neg Hx   . Colon polyps Neg Hx   . Esophageal cancer Neg Hx   . Gallbladder disease Neg Hx     Social History   Tobacco Use  . Smoking status: Former Smoker    Packs/day: 1.50    Years: 18.00    Pack years: 27.00    Quit date: 10/09/2005    Years since quitting: 14.6  . Smokeless tobacco: Never Used  Vaping Use  . Vaping Use: Never used  Substance Use Topics  . Alcohol use: Yes    Alcohol/week: 0.0 standard drinks    Comment: occ.  . Drug use: No    Home Medications Prior to Admission medications   Medication Sig Start Date End Date Taking? Authorizing Provider  albuterol (VENTOLIN HFA) 108 (90 Base) MCG/ACT inhaler Inhale 2 puffs into the lungs every 6 (six) hours as needed for wheezing or shortness of  breath. 06/08/20   Sheliah Hatch, MD  benzonatate (TESSALON) 100 MG capsule Take 1 capsule (100 mg total) by mouth every 8 (eight) hours. 06/14/20   Linwood Dibbles, MD  doxycycline (VIBRA-TABS) 100 MG tablet Take 1 tablet (100 mg total) by mouth 2 (two) times daily. 06/14/20   Linwood Dibbles, MD  EUTHYROX 112 MCG tablet Take 1 tablet by mouth once daily 04/13/20   Sheliah Hatch, MD  meloxicam (MOBIC) 15 MG tablet Take 1 tablet (15 mg total) by mouth daily. 02/26/20   Hyatt, Max T, DPM  Omega-3 Fatty Acids (FISH OIL PO) Take by mouth.    [provider]  Probiotic Product (PROBIOTIC PO) Take by mouth.    [provider]  simvastatin (ZOCOR) 40 MG tablet TAKE 1 TABLET BY MOUTH AT BEDTIME 03/26/20   Sheliah Hatch, MD  valsartan (DIOVAN) 80 MG tablet Take 1 tablet (80 mg total) by mouth daily. 02/09/20   Sheliah Hatch, MD    Allergies    Patient has no known allergies.  Review of Systems   Review of Systems  All other systems reviewed and are  negative.   Physical Exam Updated Vital Signs BP (!) 143/96 (BP Location: Left Arm)   Pulse (!) 102   Temp 99.5 F (37.5 C) (Oral)   Resp 20   Ht 1.829 m (6')   Wt (!) 156.5 kg   SpO2 95%   BMI 46.79 kg/m   Physical Exam Vitals and nursing note reviewed.  Constitutional:      General: He is not in acute distress.    Appearance: He is well-developed. He is obese.  HENT:     Head: Normocephalic and atraumatic.     Right Ear: External ear normal.     Left Ear: External ear normal.  Eyes:     General: No scleral icterus.       Right eye: No discharge.        Left eye: No discharge.     Conjunctiva/sclera: Conjunctivae normal.  Neck:     Trachea: No tracheal deviation.  Cardiovascular:     Rate and Rhythm: Normal rate and regular rhythm.  Pulmonary:     Effort: Pulmonary effort is normal. No respiratory distress.     Breath sounds: Normal breath sounds. No stridor. No wheezing or rales.  Abdominal:     General: Bowel sounds are normal. There is no distension.     Palpations: Abdomen is soft.     Tenderness: There is no abdominal tenderness. There is no guarding or rebound.  Musculoskeletal:        General: No tenderness.     Cervical back: Neck supple.  Skin:    General: Skin is warm and dry.     Findings: No rash.  Neurological:     Mental Status: He is alert.     Cranial Nerves: No cranial nerve deficit (no facial droop, extraocular movements intact, no slurred speech).     Sensory: No sensory deficit.     Motor: No abnormal muscle tone or seizure activity.     Coordination: Coordination normal.     ED Results / Procedures / Treatments   Labs (all labs ordered are listed, but only abnormal results are displayed) Labs Reviewed  SARS CORONAVIRUS 2 BY RT PCR (HOSPITAL ORDER, PERFORMED IN [facility]) - Abnormal; Notable for the following components:      Result Value   SARS Coronavirus 2 POSITIVE (*)  All other components within normal limits   CBC WITH DIFFERENTIAL/PLATELET - Abnormal; Notable for the following components:   Platelets 128 (*)    All other components within normal limits  BASIC METABOLIC PANEL - Abnormal; Notable for the following components:   Glucose, Bld 103 (*)    Creatinine, Ser 1.28 (*)    Calcium 8.7 (*)    All other components within normal limits    EKG None  Radiology DG Chest Portable 1 View  Result Date: 06/14/2020 CLINICAL DATA:  Cough shortness of breath. EXAM: PORTABLE CHEST 1 VIEW COMPARISON:  None. FINDINGS: Cardiomediastinal silhouette is normal. Mediastinal contours appear intact. There is no evidence of pleural effusion or pneumothorax. Nodular airspace opacity in the right infrahilar region. Streaky peripheral airspace opacity in the left lower lung field. Osseous structures are without acute abnormality. Soft tissues are grossly normal. IMPRESSION: 1. Nodular airspace opacity in the right infrahilar region may represent focal airspace consolidation or potentially pulmonary nodule. 2. Streaky peripheral airspace opacity in the left lower lung field may represent atelectasis or subpleural airspace consolidation. 3. Follow-up with chest CT without contrast or PA and lateral radiograph of the chest may be considered. Electronically Signed   By: Ted Mcalpineobrinka  Dimitrova M.D.   On: 06/14/2020 11:27    Procedures Procedures (including critical care time)  Medications Ordered in ED Medications  cefTRIAXone (ROCEPHIN) 1 g in sodium chloride 0.9 % 100 mL IVPB (1 g Intravenous New Bag/Given 06/14/20 1228)    ED Course  I have reviewed the triage vital signs and the nursing notes.  Pertinent labs & imaging results that were available during my care of the patient were reviewed by me and considered in my medical decision making (see chart for details).  Clinical Course as of Jun 14 1329  Mon Jun 14, 2020  1148 Chest x-ray with possible consolidation.  Follow-up CT scan recommended   [JK]  1216 Chest  x-ray with infiltrates.  Recent negative Covid test.  I will give a dose of antibiotics while waiting for his repeat Covid test today   [JK]  1216 Patient's Covid test is positive.  Patient symptoms started a week ago Thursday.  Day 12 of symptoms   [JK]  1227 Creatinine is increased compared to previous values   [JK]    Clinical Course User Index [JK] Linwood DibblesKnapp, Crayton Savarese, MD   MDM Rules/Calculators/A&P                          Patient presented to the ED for evaluation of shortness of breath.  Patient had been tested for Covid previously and was negative.  In the ED the patient's Covid test was positive.  He was given an antibiotics initially for possible bacterial pneumonia.  He has had the symptoms for over 12 days now.  Its possible he has a coinfection.  I will discharge him on doxycycline.  Patient is outside monoclonal antibody window as he is day 12 now.  Fortunately patient is not requiring any oxygen.  He is hemodynamically stable.  He is stable for discharge. Final Clinical Impression(s) / ED Diagnoses Final diagnoses:  COVID-19 virus infection    Rx / DC Orders ED Discharge Orders         Ordered    doxycycline (VIBRA-TABS) 100 MG tablet  2 times daily        06/14/20 1329    benzonatate (TESSALON) 100 MG capsule  Every 8 hours  06/14/20 1329           Linwood Dibbles, MD 06/14/20 1330

## 2020-06-14 NOTE — ED Triage Notes (Signed)
Fever at home. tmax 101

## 2020-06-14 NOTE — ED Triage Notes (Signed)
Cough, SOB, N/V/D x 1 week. Negative covid test 1 week ago.

## 2020-06-14 NOTE — ED Notes (Signed)
Ambulated from triage to room 2. SpO2 95-96%, HR max 116, resp shallow.  Not taking Albuterol secondary to heart racing.

## 2020-06-14 NOTE — Discharge Instructions (Addendum)
Your Covid test was positive today.  Remained isolated for 10 days.  Return to the ER for shortness of breath or other concerning symptoms

## 2020-06-15 NOTE — Telephone Encounter (Signed)
If pt is still having severe symptoms- weakness, dehydration, shortness of breath, etc- he must go to ER for evaluation

## 2020-06-15 NOTE — Telephone Encounter (Signed)
Pt went to ER yesterday, confirmed COVID

## 2020-06-18 ENCOUNTER — Encounter (HOSPITAL_COMMUNITY): Payer: Self-pay

## 2020-06-18 ENCOUNTER — Emergency Department (HOSPITAL_COMMUNITY)
Admission: EM | Admit: 2020-06-18 | Discharge: 2020-06-19 | Disposition: A | Discharge status: Home / Self Care | Payer: BC Managed Care – PPO | Source: Home / Self Care | Attending: Emergency Medicine | Admitting: Emergency Medicine

## 2020-06-18 ENCOUNTER — Encounter: Payer: BC Managed Care – PPO | Admitting: Family Medicine

## 2020-06-18 ENCOUNTER — Other Ambulatory Visit: Payer: Self-pay

## 2020-06-18 DIAGNOSIS — Z6841 Body Mass Index (BMI) 40.0 and over, adult: Secondary | ICD-10-CM | POA: Diagnosis not present

## 2020-06-18 DIAGNOSIS — J1282 Pneumonia due to coronavirus disease 2019: Secondary | ICD-10-CM | POA: Diagnosis not present

## 2020-06-18 DIAGNOSIS — I10 Essential (primary) hypertension: Secondary | ICD-10-CM | POA: Diagnosis not present

## 2020-06-18 DIAGNOSIS — J9811 Atelectasis: Secondary | ICD-10-CM | POA: Diagnosis not present

## 2020-06-18 DIAGNOSIS — J9601 Acute respiratory failure with hypoxia: Secondary | ICD-10-CM | POA: Diagnosis not present

## 2020-06-18 DIAGNOSIS — I82452 Acute embolism and thrombosis of left peroneal vein: Secondary | ICD-10-CM | POA: Diagnosis not present

## 2020-06-18 DIAGNOSIS — R112 Nausea with vomiting, unspecified: Secondary | ICD-10-CM

## 2020-06-18 DIAGNOSIS — J301 Allergic rhinitis due to pollen: Secondary | ICD-10-CM | POA: Diagnosis present

## 2020-06-18 DIAGNOSIS — U071 COVID-19: Secondary | ICD-10-CM | POA: Diagnosis not present

## 2020-06-18 DIAGNOSIS — R7989 Other specified abnormal findings of blood chemistry: Secondary | ICD-10-CM | POA: Diagnosis not present

## 2020-06-18 DIAGNOSIS — D696 Thrombocytopenia, unspecified: Secondary | ICD-10-CM | POA: Diagnosis not present

## 2020-06-18 DIAGNOSIS — J189 Pneumonia, unspecified organism: Secondary | ICD-10-CM | POA: Diagnosis not present

## 2020-06-18 DIAGNOSIS — I2699 Other pulmonary embolism without acute cor pulmonale: Secondary | ICD-10-CM | POA: Diagnosis not present

## 2020-06-18 DIAGNOSIS — R0902 Hypoxemia: Secondary | ICD-10-CM | POA: Diagnosis not present

## 2020-06-18 DIAGNOSIS — R05 Cough: Secondary | ICD-10-CM | POA: Diagnosis not present

## 2020-06-18 DIAGNOSIS — F419 Anxiety disorder, unspecified: Secondary | ICD-10-CM | POA: Diagnosis not present

## 2020-06-18 DIAGNOSIS — E039 Hypothyroidism, unspecified: Secondary | ICD-10-CM | POA: Insufficient documentation

## 2020-06-18 DIAGNOSIS — E785 Hyperlipidemia, unspecified: Secondary | ICD-10-CM | POA: Diagnosis not present

## 2020-06-18 DIAGNOSIS — Z87891 Personal history of nicotine dependence: Secondary | ICD-10-CM | POA: Insufficient documentation

## 2020-06-18 DIAGNOSIS — Z79890 Hormone replacement therapy (postmenopausal): Secondary | ICD-10-CM | POA: Insufficient documentation

## 2020-06-18 DIAGNOSIS — G4733 Obstructive sleep apnea (adult) (pediatric): Secondary | ICD-10-CM | POA: Diagnosis not present

## 2020-06-18 DIAGNOSIS — I82411 Acute embolism and thrombosis of right femoral vein: Secondary | ICD-10-CM | POA: Diagnosis not present

## 2020-06-18 DIAGNOSIS — R0602 Shortness of breath: Secondary | ICD-10-CM | POA: Diagnosis not present

## 2020-06-18 DIAGNOSIS — Z79899 Other long term (current) drug therapy: Secondary | ICD-10-CM | POA: Insufficient documentation

## 2020-06-18 DIAGNOSIS — Z8249 Family history of ischemic heart disease and other diseases of the circulatory system: Secondary | ICD-10-CM | POA: Diagnosis not present

## 2020-06-18 DIAGNOSIS — M109 Gout, unspecified: Secondary | ICD-10-CM | POA: Diagnosis not present

## 2020-06-18 DIAGNOSIS — A0839 Other viral enteritis: Secondary | ICD-10-CM | POA: Diagnosis not present

## 2020-06-18 DIAGNOSIS — F329 Major depressive disorder, single episode, unspecified: Secondary | ICD-10-CM | POA: Diagnosis not present

## 2020-06-18 LAB — CBC
HCT: 45.6 % (ref 39.0–52.0)
Hemoglobin: 15.3 g/dL (ref 13.0–17.0)
MCH: 31.6 pg (ref 26.0–34.0)
MCHC: 33.6 g/dL (ref 30.0–36.0)
MCV: 94.2 fL (ref 80.0–100.0)
Platelets: 131 10*3/uL — ABNORMAL LOW (ref 150–400)
RBC: 4.84 MIL/uL (ref 4.22–5.81)
RDW: 13 % (ref 11.5–15.5)
WBC: 6.9 10*3/uL (ref 4.0–10.5)
nRBC: 0 % (ref 0.0–0.2)

## 2020-06-18 LAB — COMPREHENSIVE METABOLIC PANEL
ALT: 17 U/L (ref 0–44)
AST: 27 U/L (ref 15–41)
Albumin: 3.7 g/dL (ref 3.5–5.0)
Alkaline Phosphatase: 62 U/L (ref 38–126)
Anion gap: 10 (ref 5–15)
BUN: 12 mg/dL (ref 6–20)
CO2: 24 mmol/L (ref 22–32)
Calcium: 8.7 mg/dL — ABNORMAL LOW (ref 8.9–10.3)
Chloride: 101 mmol/L (ref 98–111)
Creatinine, Ser: 1.27 mg/dL — ABNORMAL HIGH (ref 0.61–1.24)
GFR calc Af Amer: >60 mL/min (ref >60)
GFR calc non Af Amer: >60 mL/min (ref >60)
Glucose, Bld: 108 mg/dL — ABNORMAL HIGH (ref 70–99)
Potassium: 4.1 mmol/L (ref 3.5–5.1)
Sodium: 135 mmol/L (ref 135–145)
Total Bilirubin: 0.8 mg/dL (ref 0.3–1.2)
Total Protein: 7 g/dL (ref 6.5–8.1)

## 2020-06-18 NOTE — ED Triage Notes (Signed)
Emergency Medicine Provider Triage Evaluation Note  Rodney Mcclain , a 44 y.o. male  was evaluated in triage.  Pt complains of 2 weeks of nausea, vomiting, diarrhea, cough, fever, shortness of breath.  Tested positive for Covid 4 days ago.  Has been trying to drink fluids and taking Tylenol and resting for symptoms with little relief.  Denies blood in the urine or stool.  He is a former smoker.  Temperature up to 102 F at home.  Notices shortness of breath at rest and with exertion.  Review of Systems  Positive: Fever, vomiting, diarrhea, cough, shortness of breath Negative: Chest pain  Physical Exam  There were no vitals taken for this visit. Gen:   Awake, no distress   HEENT:  Atraumatic  Resp:  Mildly tachypneic, globally diminished breath sounds though examination limited due to body habitus Cardiac:  Normal rate  Abd:   Nondistended, nontender  MSK:   Moves extremities without difficulty  Neuro:  Speech clear   Medical Decision Making  Medically screening exam initiated at 3:02 PM.  Appropriate orders placed.  Kingston Shawgo was informed that the remainder of the evaluation will be completed by another provider, this initial triage assessment does not replace that evaluation, and the importance of remaining in the ED until their evaluation is complete.  Clinical Impression  Persistent symptoms in the setting of COVID-19 infection.  Will obtain basic labs, patient may require IV fluid hydration.  At this time SPO2 saturations stable with no signs of significant respiratory distress.  Examination of the abdomen benign at this time.   Jeanie Sewer, PA-C 06/18/20 1504

## 2020-06-18 NOTE — ED Triage Notes (Signed)
Pt arrives to ED w/ c/o cough, fever, diarrhea. Pt tested positive for covid Monday.

## 2020-06-19 MED ORDER — ONDANSETRON HCL 4 MG/2ML IJ SOLN
4.0000 mg | Freq: Once | INTRAMUSCULAR | Status: AC
  Administered 2020-06-19: 4 mg via INTRAVENOUS
  Filled 2020-06-19: qty 2

## 2020-06-19 MED ORDER — LOPERAMIDE HCL 2 MG PO CAPS
4.0000 mg | ORAL_CAPSULE | Freq: Once | ORAL | Status: AC
  Administered 2020-06-19: 4 mg via ORAL
  Filled 2020-06-19: qty 2

## 2020-06-19 MED ORDER — ACETAMINOPHEN 325 MG PO TABS
650.0000 mg | ORAL_TABLET | Freq: Once | ORAL | Status: AC
  Administered 2020-06-19: 650 mg via ORAL
  Filled 2020-06-19: qty 2

## 2020-06-19 MED ORDER — LACTATED RINGERS IV BOLUS
1000.0000 mL | Freq: Once | INTRAVENOUS | Status: AC
  Administered 2020-06-19: 1000 mL via INTRAVENOUS

## 2020-06-19 MED ORDER — ONDANSETRON HCL 4 MG PO TABS: 4.0000 mg | ORAL_TABLET | Freq: Four times a day (QID) | ORAL | 0 refills | Status: DC | PRN

## 2020-06-19 NOTE — ED Notes (Addendum)
Pt's oxygen dropped to 88 during ambulation and returned to 91 once resting again. Pt was not wearing oxygen when ambulated.

## 2020-06-19 NOTE — ED Notes (Signed)
Pt resting at room air had an O2 sat of 88. While ambulating pt's sats improved to 92 on 2L O2. While resting on 2L pt's sats improved again to 96.   EDP notified of these findings

## 2020-06-19 NOTE — TOC Initial Note (Signed)
Transition of Care [facility]) - Initial/Assessment Note    Patient Details  Name: Rodney Mcclain MRN: [id number] Date of Birth: 10-21-1975  Transition of Care [facility]) CM/SW Contact:    Lockie Pares, RN Phone Number: 06/19/2020, 10:20 AM  Clinical Narrative:                 Consulted for DME home health needs. Patient is 44 YO with COVID. No oxygen requirement at this time.  Will need a follow up appointment with Primary MD or COVID clinic Will be on patient instructions.    Barriers to Discharge: No Barriers Identified   Patient Goals and CMS Choice        Expected Discharge Plan and Services                                                Prior Living Arrangements/Services                       Activities of Daily Living      Permission Sought/Granted                  Emotional Assessment              Admission diagnosis:  COVID positive, weakness,diarrhea,weakness Patient Active Problem List   Diagnosis Date Noted  . Hypothyroid 10/31/2019  . Snoring 01/12/2018  . Dyslipidemia 12/31/2017  . Vertigo 03/29/2017  . Gout flare 01/19/2014  . General medical examination 03/03/2011  . Morbid obesity (HCC) 01/31/2011  . Anxiety and depression 01/31/2011  . ANAL FISSURE 06/17/2009  . ALLERGIC RHINITIS DUE TO POLLEN 01/14/2009  . Essential hypertension 12/14/2008  . BACK PAIN, THORACIC REGION 12/14/2008   PCP:  Sheliah Hatch, MD Pharmacy:   [facility] [address] [place] - [address] [facility] [place] [address] Phone: [phone] Fax: [phone]  [facility] - [place], [place] - [address] [place], [address] [place], [address] [place] [address] Phone: [phone] Fax: [phone]     Social Determinants of Health (SDOH) Interventions    Readmission Risk Interventions No flowsheet data found.

## 2020-06-19 NOTE — ED Notes (Signed)
Patient verbalizes understanding of discharge instructions . Opportunity for questions and answers were provided . Armband removed by staff ,Pt discharged from ED. W/C  offered at D/C  and Declined W/C at D/C and was escorted to lobby by RN.  

## 2020-06-19 NOTE — Discharge Instructions (Addendum)
For your Covid please follow up in our Covid clinic in 1-2 days.  The phone number is included. Your oxygen level remained high enough to not need oxygen at this time, but this could change over the next few days.  Please buy a pulse oximeter over the counter (at [employer], [employer], [employer], etc) and monitor your oxygen level.  If you drop below 85%, or you simply feel your breathing is worsening, you should return to the ER.  You may need hospital admission at that time.  Otherwise your bloodwork today did not show signs of dehydration.  Your electrolytes were normal.  Keep drinking fluids and eating at home.  Covid illness lasts 2 weeks typically, but some people can have symptoms lasting 1-2 months, and some can have symptoms lasting longer.    Take loperamide (Imodium AD) as needed for diarrhea.  Drink plenty of fluids.  Take acetaminophen as needed for fever or pain.  Return if your breathing is getting worse.

## 2020-06-19 NOTE — ED Provider Notes (Signed)
[facility] EMERGENCY DEPARTMENT Provider Note   CSN: [id number] Arrival date & time: 06/18/20  1429   History Chief Complaint  Patient presents with  . Diarrhea    Rodney Mcclain is a 44 y.o. male.  The history is provided by the patient.  Diarrhea He has history of hypertension, hyperlipidemia, gout, obesity and was recently diagnosed with COVID-19.  He has been sick for last 2 weeks with fever as high as 102, cough productive of white sputum, nausea, vomiting, diarrhea.  He has had some generalized abdominal cramps.  Today he is starting to have some shortness of breath.  He had lost his sense of taste, but states it is starting to return.  His main complaint is diarrhea.  He is having diarrhea about four times a day.  He states anytime that he eats or drinks, he has diarrhea.  He has taken Pepto-Bismol without any relief.  Past Medical History:  Diagnosis Date  . Anal fissure   . Anxiety and depression   . Depression   . Diverticulitis   . Gout   . Hypertension   . Obesity   . OSA on CPAP     Patient Active Problem List   Diagnosis Date Noted  . Hypothyroid 10/31/2019  . Snoring 01/12/2018  . Dyslipidemia 12/31/2017  . Vertigo 03/29/2017  . Gout flare 01/19/2014  . General medical examination 03/03/2011  . Morbid obesity (HCC) 01/31/2011  . Anxiety and depression 01/31/2011  . ANAL FISSURE 06/17/2009  . ALLERGIC RHINITIS DUE TO POLLEN 01/14/2009  . Essential hypertension 12/14/2008  . BACK PAIN, THORACIC REGION 12/14/2008    Past Surgical History:  Procedure Laterality Date  . HIP SURGERY  1986 & 1991   bilateral pin placement  . TOE SURGERY     bilateral for bone spurs       Family History  Problem Relation Age of Onset  . Arthritis Mother   . Heart disease Mother   . Hypertension Mother   . Diabetes Mother   . Sleep apnea Mother   . Arthritis Father   . Kidney cancer Father   . Arthritis Maternal Grandfather   . Stroke Maternal  Grandfather   . Lung cancer Other        aunt  . Hypertension Other        all grandparents  . Diabetes Maternal Grandmother   . Sleep apnea Sister   . Sleep apnea Brother   . Colon cancer Neg Hx   . Colon polyps Neg Hx   . Esophageal cancer Neg Hx   . Gallbladder disease Neg Hx     Social History   Tobacco Use  . Smoking status: Former Smoker    Packs/day: 1.50    Years: 18.00    Pack years: 27.00    Quit date: 10/09/2005    Years since quitting: 14.7  . Smokeless tobacco: Never Used  Vaping Use  . Vaping Use: Never used  Substance Use Topics  . Alcohol use: Yes    Alcohol/week: 0.0 standard drinks    Comment: occ.  . Drug use: No    Home Medications Prior to Admission medications   Medication Sig Start Date End Date Taking? Authorizing Provider  albuterol (VENTOLIN HFA) 108 (90 Base) MCG/ACT inhaler Inhale 2 puffs into the lungs every 6 (six) hours as needed for wheezing or shortness of breath. 06/08/20   Sheliah Hatch, MD  benzonatate (TESSALON) 100 MG capsule Take 1 capsule (100 mg  total) by mouth every 8 (eight) hours. 06/14/20   Linwood Dibbles, MD  doxycycline (VIBRA-TABS) 100 MG tablet Take 1 tablet (100 mg total) by mouth 2 (two) times daily. 06/14/20   Linwood Dibbles, MD  EUTHYROX 112 MCG tablet Take 1 tablet by mouth once daily 04/13/20   Sheliah Hatch, MD  meloxicam (MOBIC) 15 MG tablet Take 1 tablet (15 mg total) by mouth daily. 02/26/20   Hyatt, Max T, DPM  Omega-3 Fatty Acids (FISH OIL PO) Take by mouth.    [provider]  Probiotic Product (PROBIOTIC PO) Take by mouth.    [provider]  simvastatin (ZOCOR) 40 MG tablet TAKE 1 TABLET BY MOUTH AT BEDTIME 03/26/20   Sheliah Hatch, MD  valsartan (DIOVAN) 80 MG tablet Take 1 tablet (80 mg total) by mouth daily. 02/09/20   Sheliah Hatch, MD    Allergies    Patient has no known allergies.  Review of Systems   Review of Systems  Gastrointestinal: Positive for diarrhea.  All other  systems reviewed and are negative.   Physical Exam Updated Vital Signs BP 96/64 (BP Location: Right Arm)   Pulse 88   Temp 99.5 F (37.5 C) (Oral)   Resp 18   SpO2 91%   Physical Exam Vitals and nursing note reviewed.   Morbidly obese 44 year old male, resting comfortably and in no acute distress. Vital signs are normal. Oxygen saturation is 91%, which is normal. Head is normocephalic and atraumatic. PERRLA, EOMI. Oropharynx is clear. Neck is nontender and supple without adenopathy or JVD. Back is nontender and there is no CVA tenderness. Lungs are clear without rales, wheezes, or rhonchi. Chest is nontender. Heart has regular rate and rhythm without murmur. Abdomen is soft, flat, nontender without masses or hepatosplenomegaly and peristalsis is hypoactive. Extremities have no cyanosis or edema, full range of motion is present. Skin is warm and dry without rash. Neurologic: Mental status is normal, cranial nerves are intact, there are no motor or sensory deficits.  ED Results / Procedures / Treatments   Labs (all labs ordered are listed, but only abnormal results are displayed) Labs Reviewed  CBC - Abnormal; Notable for the following components:      Result Value   Platelets 131 (*)    All other components within normal limits  COMPREHENSIVE METABOLIC PANEL - Abnormal; Notable for the following components:   Glucose, Bld 108 (*)    Creatinine, Ser 1.27 (*)    Calcium 8.7 (*)    All other components within normal limits   Procedures Procedures   Medications Ordered in ED Medications  lactated ringers bolus 1,000 mL (0 mLs Intravenous Stopped 06/19/20 1059)  loperamide (IMODIUM) capsule 4 mg (4 mg Oral Given 06/19/20 0447)  ondansetron (ZOFRAN) injection 4 mg (4 mg Intravenous Given 06/19/20 0610)  acetaminophen (TYLENOL) tablet 650 mg (650 mg Oral Given 06/19/20 6063)    ED Course  I have reviewed the triage vital signs and the nursing notes.  Pertinent labs &  imaging results that were available during my care of the patient were reviewed by me and considered in my medical decision making (see chart for details).  MDM Rules/Calculators/A&P COVID-19 infection with vomiting and diarrhea.  Oxygen saturation is borderline.  Old records reviewed showing recent ED visit with positive COVID-19 PCR test.  Labs today show mild renal insufficiency and mild thrombocytopenia.  Both of these are unchanged compared with 9/6.  Baseline creatinine seems to be approximately  1.1 compared with 1.27 today.  Blood pressure is also borderline low suggesting some degree of dehydration.  He will be given IV fluids, ondansetron, oral loperamide and reassessed.  Nausea has improved, but he is still feeling weak.  He has not had any diarrhea while in the emergency department.  Will ambulate to make sure a is maintaining adequate oxygen saturation.  Anticipate discharge with prescription for ondansetron and instructions use over-the-counter loperamide as needed.  Rodney Mcclain was evaluated in Emergency Department on 06/19/2020 for the symptoms described in the history of present illness. He was evaluated in the context of the global COVID-19 pandemic, which necessitated consideration that the patient might be at risk for infection with the SARS-CoV-2 virus that causes COVID-19. Institutional protocols and algorithms that pertain to the evaluation of patients at risk for COVID-19 are in a state of rapid change based on information released by regulatory bodies including the CDC and federal and state organizations. These policies and algorithms were followed during the patient's care in the ED. Final Clinical Impression(s) / ED Diagnoses Final diagnoses:  COVID-19 virus infection  Nausea vomiting and diarrhea    Rx / DC Orders ED Discharge Orders    None       Dione Booze, MD 06/20/20 0725

## 2020-06-19 NOTE — ED Provider Notes (Signed)
I spoke to our case manager about home oxygen.  She reports that oxygen is offered for patients with O2 sat below 88%, and titrated to get sats up to 88%.  This patient therefore does not qualify at this time.  We'll discharge home with instructions to follow up in the covid clinic and buy a pulse ox for home monitoring.   Terald Sleeper, MD 06/19/20 1030

## 2020-06-20 ENCOUNTER — Emergency Department (HOSPITAL_COMMUNITY): Payer: BC Managed Care – PPO

## 2020-06-20 ENCOUNTER — Encounter (HOSPITAL_COMMUNITY): Payer: Self-pay | Admitting: Emergency Medicine

## 2020-06-20 ENCOUNTER — Other Ambulatory Visit: Payer: Self-pay

## 2020-06-20 ENCOUNTER — Inpatient Hospital Stay (HOSPITAL_COMMUNITY)
Admission: EM | Admit: 2020-06-20 | Discharge: 2020-06-26 | DRG: 177 | Disposition: A | Discharge status: Home / Self Care | Payer: BC Managed Care – PPO | Attending: Internal Medicine | Admitting: Internal Medicine

## 2020-06-20 DIAGNOSIS — J9601 Acute respiratory failure with hypoxia: Secondary | ICD-10-CM | POA: Diagnosis present

## 2020-06-20 DIAGNOSIS — Z6841 Body Mass Index (BMI) 40.0 and over, adult: Secondary | ICD-10-CM

## 2020-06-20 DIAGNOSIS — D696 Thrombocytopenia, unspecified: Secondary | ICD-10-CM | POA: Diagnosis present

## 2020-06-20 DIAGNOSIS — I82411 Acute embolism and thrombosis of right femoral vein: Secondary | ICD-10-CM | POA: Diagnosis present

## 2020-06-20 DIAGNOSIS — E785 Hyperlipidemia, unspecified: Secondary | ICD-10-CM | POA: Diagnosis present

## 2020-06-20 DIAGNOSIS — A0839 Other viral enteritis: Secondary | ICD-10-CM | POA: Diagnosis present

## 2020-06-20 DIAGNOSIS — Z8249 Family history of ischemic heart disease and other diseases of the circulatory system: Secondary | ICD-10-CM | POA: Diagnosis not present

## 2020-06-20 DIAGNOSIS — U071 COVID-19: Principal | ICD-10-CM

## 2020-06-20 DIAGNOSIS — F329 Major depressive disorder, single episode, unspecified: Secondary | ICD-10-CM | POA: Diagnosis present

## 2020-06-20 DIAGNOSIS — Z79890 Hormone replacement therapy (postmenopausal): Secondary | ICD-10-CM | POA: Diagnosis not present

## 2020-06-20 DIAGNOSIS — Z79899 Other long term (current) drug therapy: Secondary | ICD-10-CM | POA: Diagnosis not present

## 2020-06-20 DIAGNOSIS — R7989 Other specified abnormal findings of blood chemistry: Secondary | ICD-10-CM | POA: Diagnosis not present

## 2020-06-20 DIAGNOSIS — J1282 Pneumonia due to coronavirus disease 2019: Secondary | ICD-10-CM | POA: Diagnosis present

## 2020-06-20 DIAGNOSIS — Z87891 Personal history of nicotine dependence: Secondary | ICD-10-CM

## 2020-06-20 DIAGNOSIS — E039 Hypothyroidism, unspecified: Secondary | ICD-10-CM | POA: Diagnosis present

## 2020-06-20 DIAGNOSIS — I10 Essential (primary) hypertension: Secondary | ICD-10-CM | POA: Diagnosis present

## 2020-06-20 DIAGNOSIS — I82452 Acute embolism and thrombosis of left peroneal vein: Secondary | ICD-10-CM | POA: Diagnosis present

## 2020-06-20 DIAGNOSIS — M109 Gout, unspecified: Secondary | ICD-10-CM | POA: Diagnosis present

## 2020-06-20 DIAGNOSIS — I2699 Other pulmonary embolism without acute cor pulmonale: Secondary | ICD-10-CM | POA: Diagnosis present

## 2020-06-20 DIAGNOSIS — F419 Anxiety disorder, unspecified: Secondary | ICD-10-CM | POA: Diagnosis present

## 2020-06-20 DIAGNOSIS — J301 Allergic rhinitis due to pollen: Secondary | ICD-10-CM | POA: Diagnosis present

## 2020-06-20 DIAGNOSIS — G4733 Obstructive sleep apnea (adult) (pediatric): Secondary | ICD-10-CM | POA: Diagnosis present

## 2020-06-20 DIAGNOSIS — J189 Pneumonia, unspecified organism: Secondary | ICD-10-CM

## 2020-06-20 LAB — PROCALCITONIN: Procalcitonin: <0.1 ng/mL

## 2020-06-20 LAB — D-DIMER, QUANTITATIVE: D-Dimer, Quant: 10.76 ug/mL-FEU — ABNORMAL HIGH (ref 0.00–0.50)

## 2020-06-20 LAB — FERRITIN: Ferritin: 440 ng/mL — ABNORMAL HIGH (ref 24–336)

## 2020-06-20 LAB — CBC WITH DIFFERENTIAL/PLATELET
Abs Immature Granulocytes: 0.06 10*3/uL (ref 0.00–0.07)
Basophils Absolute: 0 10*3/uL (ref 0.0–0.1)
Basophils Relative: 0 %
Eosinophils Absolute: 0 10*3/uL (ref 0.0–0.5)
Eosinophils Relative: 0 %
HCT: 41.6 % (ref 39.0–52.0)
Hemoglobin: 13.6 g/dL (ref 13.0–17.0)
Immature Granulocytes: 1 %
Lymphocytes Relative: 13 %
Lymphs Abs: 1.1 10*3/uL (ref 0.7–4.0)
MCH: 31.4 pg (ref 26.0–34.0)
MCHC: 32.7 g/dL (ref 30.0–36.0)
MCV: 96.1 fL (ref 80.0–100.0)
Monocytes Absolute: 0.4 10*3/uL (ref 0.1–1.0)
Monocytes Relative: 5 %
Neutro Abs: 7.1 10*3/uL (ref 1.7–7.7)
Neutrophils Relative %: 81 %
Platelets: 131 10*3/uL — ABNORMAL LOW (ref 150–400)
RBC: 4.33 MIL/uL (ref 4.22–5.81)
RDW: 13.2 % (ref 11.5–15.5)
WBC: 8.7 10*3/uL (ref 4.0–10.5)
nRBC: 0 % (ref 0.0–0.2)

## 2020-06-20 LAB — LACTIC ACID, PLASMA
Lactic Acid, Venous: 3.4 mmol/L (ref 0.5–1.9)
Lactic Acid, Venous: 1.9 mmol/L (ref 0.5–1.9)

## 2020-06-20 LAB — COMPREHENSIVE METABOLIC PANEL
ALT: 15 U/L (ref 0–44)
AST: 28 U/L (ref 15–41)
Albumin: 3.2 g/dL — ABNORMAL LOW (ref 3.5–5.0)
Alkaline Phosphatase: 50 U/L (ref 38–126)
Anion gap: 12 (ref 5–15)
BUN: 11 mg/dL (ref 6–20)
CO2: 23 mmol/L (ref 22–32)
Calcium: 8.5 mg/dL — ABNORMAL LOW (ref 8.9–10.3)
Chloride: 102 mmol/L (ref 98–111)
Creatinine, Ser: 1.13 mg/dL (ref 0.61–1.24)
GFR calc Af Amer: >60 mL/min (ref >60)
GFR calc non Af Amer: >60 mL/min (ref >60)
Glucose, Bld: 100 mg/dL — ABNORMAL HIGH (ref 70–99)
Potassium: 4.2 mmol/L (ref 3.5–5.1)
Sodium: 137 mmol/L (ref 135–145)
Total Bilirubin: 0.7 mg/dL (ref 0.3–1.2)
Total Protein: 6.4 g/dL — ABNORMAL LOW (ref 6.5–8.1)

## 2020-06-20 LAB — FIBRINOGEN: Fibrinogen: 492 mg/dL — ABNORMAL HIGH (ref 210–475)

## 2020-06-20 LAB — TRIGLYCERIDES: Triglycerides: 76 mg/dL (ref <150)

## 2020-06-20 LAB — C-REACTIVE PROTEIN: CRP: 8.4 mg/dL — ABNORMAL HIGH (ref <1.0)

## 2020-06-20 LAB — LACTATE DEHYDROGENASE: LDH: 275 U/L — ABNORMAL HIGH (ref 98–192)

## 2020-06-20 MED ORDER — SODIUM CHLORIDE 0.9 % IV SOLN
200.0000 mg | Freq: Once | INTRAVENOUS | Status: AC
  Administered 2020-06-20: 200 mg via INTRAVENOUS

## 2020-06-20 MED ORDER — SODIUM CHLORIDE 0.9 % IV SOLN
200.0000 mg | Freq: Once | INTRAVENOUS | Status: DC
  Filled 2020-06-20: qty 40

## 2020-06-20 MED ORDER — ONDANSETRON HCL 4 MG PO TABS
4.0000 mg | ORAL_TABLET | Freq: Four times a day (QID) | ORAL | Status: DC | PRN
  Administered 2020-06-24: 4 mg via ORAL
  Filled 2020-06-20 (×2): qty 1

## 2020-06-20 MED ORDER — LOPERAMIDE HCL 2 MG PO CAPS: 2.0000 mg | ORAL_CAPSULE | ORAL | Status: DC | PRN

## 2020-06-20 MED ORDER — SIMVASTATIN 20 MG PO TABS
40.0000 mg | ORAL_TABLET | Freq: Every day | ORAL | Status: DC
  Administered 2020-06-21 – 2020-06-25 (×6): 40 mg via ORAL
  Filled 2020-06-20 (×6): qty 2

## 2020-06-20 MED ORDER — BACID PO TABS
2.0000 | ORAL_TABLET | Freq: Three times a day (TID) | ORAL | Status: DC
  Administered 2020-06-20 – 2020-06-26 (×17): 2 via ORAL
  Filled 2020-06-20 (×21): qty 2

## 2020-06-20 MED ORDER — GUAIFENESIN-DM 100-10 MG/5ML PO SYRP
10.0000 mL | ORAL_SOLUTION | ORAL | Status: DC | PRN
  Administered 2020-06-22 (×2): 10 mL via ORAL
  Filled 2020-06-20 (×2): qty 10

## 2020-06-20 MED ORDER — LEVOTHYROXINE SODIUM 112 MCG PO TABS
112.0000 ug | ORAL_TABLET | Freq: Every day | ORAL | Status: DC
  Administered 2020-06-21 – 2020-06-25 (×6): 112 ug via ORAL
  Filled 2020-06-20 (×7): qty 1

## 2020-06-20 MED ORDER — METHYLPREDNISOLONE SODIUM SUCC 40 MG IJ SOLR: 40.0000 mg | Freq: Two times a day (BID) | INTRAMUSCULAR | Status: DC

## 2020-06-20 MED ORDER — SODIUM CHLORIDE 0.9 % IV BOLUS
500.0000 mL | Freq: Once | INTRAVENOUS | Status: AC
  Administered 2020-06-20: 500 mL via INTRAVENOUS

## 2020-06-20 MED ORDER — RISAQUAD PO CAPS
1.0000 | ORAL_CAPSULE | Freq: Every day | ORAL | Status: DC
  Administered 2020-06-20 – 2020-06-26 (×7): 1 via ORAL
  Filled 2020-06-20 (×7): qty 1

## 2020-06-20 MED ORDER — BARICITINIB 2 MG PO TABS
4.0000 mg | ORAL_TABLET | Freq: Every day | ORAL | Status: DC
  Administered 2020-06-20 – 2020-06-26 (×7): 4 mg via ORAL
  Filled 2020-06-20 (×7): qty 2

## 2020-06-20 MED ORDER — ENOXAPARIN SODIUM 80 MG/0.8ML ~~LOC~~ SOLN
75.0000 mg | SUBCUTANEOUS | Status: DC
  Administered 2020-06-20: 75 mg via SUBCUTANEOUS
  Filled 2020-06-20: qty 0.75

## 2020-06-20 MED ORDER — DEXAMETHASONE SODIUM PHOSPHATE 10 MG/ML IJ SOLN
6.0000 mg | Freq: Once | INTRAMUSCULAR | Status: AC
  Administered 2020-06-20: 6 mg via INTRAVENOUS
  Filled 2020-06-20: qty 1

## 2020-06-20 MED ORDER — BENZONATATE 100 MG PO CAPS
100.0000 mg | ORAL_CAPSULE | Freq: Three times a day (TID) | ORAL | Status: DC
  Administered 2020-06-20 – 2020-06-26 (×17): 100 mg via ORAL
  Filled 2020-06-20 (×17): qty 1

## 2020-06-20 MED ORDER — SODIUM CHLORIDE 0.9 % IV SOLN: 100.0000 mg | Freq: Every day | INTRAVENOUS | Status: DC

## 2020-06-20 MED ORDER — ALBUTEROL SULFATE HFA 108 (90 BASE) MCG/ACT IN AERS
2.0000 | INHALATION_SPRAY | Freq: Four times a day (QID) | RESPIRATORY_TRACT | Status: DC
  Administered 2020-06-20 – 2020-06-26 (×23): 2 via RESPIRATORY_TRACT
  Filled 2020-06-20 (×2): qty 6.7

## 2020-06-20 MED ORDER — SODIUM CHLORIDE 0.9 % IV SOLN
100.0000 mg | Freq: Every day | INTRAVENOUS | Status: AC
  Administered 2020-06-21 – 2020-06-24 (×4): 100 mg via INTRAVENOUS
  Filled 2020-06-20 (×4): qty 20

## 2020-06-20 MED ORDER — ACETAMINOPHEN 325 MG PO TABS
650.0000 mg | ORAL_TABLET | Freq: Four times a day (QID) | ORAL | Status: DC | PRN
  Administered 2020-06-22: 650 mg via ORAL
  Filled 2020-06-20: qty 2

## 2020-06-20 NOTE — ED Triage Notes (Signed)
Pt to triage via [facility] from home.  + COVID on Monday.  Symptoms x 2 weeks.  Pt seen in ED yesterday and states he was sent home.  O2 level had been 87%.  States today SOB worse with sats 78-80% this morning.  88% on fire dept arrival.  Pt currently on 3 liters--94%.  Tylenol last taken at 8am.

## 2020-06-20 NOTE — ED Provider Notes (Signed)
15:15: Assumed care of patient from Evelena Leyden PA-C at change of shift pending remaining labs & disposition.   Please see prior provider note for full H&P.  Briefly patient is a 44 yo male with a history of OSA on CPAP, hypertension, anxiety, & depression who tested positive for COVID 19 06/14/20, returns to the ED with worsening dyspnea. Has felt ill for 2 weeks now, has had some GI sxs which seem to be improving, but remains with fever, chills, dry cough, & dyspnea. He reports SpO2 in the 70s on home monitor, in the 80s on fire dept arrival & placed on supplemental O2. Per prior team seemed to be doing okay off oxygen, plan for observation & await laboratory results & reassessment. Denies leg pain/swelling, hemoptysis, recent surgery/trauma, recent long travel, hormone use, personal hx of cancer, or hx of DVT/PE.   Physical Exam  BP (!) 107/93   Pulse 85   Temp 98.6 F (37 C) (Oral)   Resp (!) 24   SpO2 91%   Physical Exam Vitals and nursing note reviewed.  Constitutional:      Appearance: He is well-developed. He is obese. He is ill-appearing. He is not toxic-appearing.  HENT:     Head: Normocephalic and atraumatic.  Eyes:     General:        Right eye: No discharge.        Left eye: No discharge.     Conjunctiva/sclera: Conjunctivae normal.  Pulmonary:     Effort: Tachypnea present.     Breath sounds: No wheezing.  Neurological:     Mental Status: He is alert.     Comments: Clear speech.   Psychiatric:        Behavior: Behavior normal.        Thought Content: Thought content normal.     ED Course/Procedures     Results for orders placed or performed during the hospital encounter of 06/20/20  Lactic acid, plasma  Result Value Ref Range   Lactic Acid, Venous 3.4 (HH) 0.5 - 1.9 mmol/L  CBC WITH DIFFERENTIAL  Result Value Ref Range   WBC 8.7 4.0 - 10.5 K/uL   RBC 4.33 4.22 - 5.81 MIL/uL   Hemoglobin 13.6 13.0 - 17.0 g/dL   HCT 46.2 39 - 52 %   MCV 96.1 80.0 - 100.0  fL   MCH 31.4 26.0 - 34.0 pg   MCHC 32.7 30.0 - 36.0 g/dL   RDW 70.3 50.0 - 93.8 %   Platelets 131 (L) 150 - 400 K/uL   nRBC 0.0 0.0 - 0.2 %   Neutrophils Relative % 81 %   Neutro Abs 7.1 1.7 - 7.7 K/uL   Lymphocytes Relative 13 %   Lymphs Abs 1.1 0.7 - 4.0 K/uL   Monocytes Relative 5 %   Monocytes Absolute 0.4 0 - 1 K/uL   Eosinophils Relative 0 %   Eosinophils Absolute 0.0 0 - 0 K/uL   Basophils Relative 0 %   Basophils Absolute 0.0 0 - 0 K/uL   Immature Granulocytes 1 %   Abs Immature Granulocytes 0.06 0.00 - 0.07 K/uL  Comprehensive metabolic panel  Result Value Ref Range   Sodium 137 135 - 145 mmol/L   Potassium 4.2 3.5 - 5.1 mmol/L   Chloride 102 98 - 111 mmol/L   CO2 23 22 - 32 mmol/L   Glucose, Bld 100 (H) 70 - 99 mg/dL   BUN 11 6 - 20 mg/dL   Creatinine, Ser 1.82  0.61 - 1.24 mg/dL   Calcium 8.5 (L) 8.9 - 10.3 mg/dL   Total Protein 6.4 (L) 6.5 - 8.1 g/dL   Albumin 3.2 (L) 3.5 - 5.0 g/dL   AST 28 15 - 41 U/L   ALT 15 0 - 44 U/L   Alkaline Phosphatase 50 38 - 126 U/L   Total Bilirubin 0.7 0.3 - 1.2 mg/dL   GFR calc non Af Amer >60 >60 mL/min   GFR calc Af Amer >60 >60 mL/min   Anion gap 12 5 - 15  Lactate dehydrogenase  Result Value Ref Range   LDH 275 (H) 98 - 192 U/L  Ferritin  Result Value Ref Range   Ferritin 440 (H) 24 - 336 ng/mL  Fibrinogen  Result Value Ref Range   Fibrinogen 492 (H) 210 - 475 mg/dL  C-reactive protein  Result Value Ref Range   CRP 8.4 (H) <1.0 mg/dL  Triglycerides  Result Value Ref Range   Triglycerides 76 <150 mg/dL   DG Chest Portable 1 View  Result Date: 06/20/2020 CLINICAL DATA:  44 year old who tested positive for COVID-19 6 days ago and has had 2 weeks of shortness of breath. Patient presents with acute worsening of shortness of breath and hypoxemia (oxygen saturation 80% on room air this morning). EXAM: PORTABLE CHEST 1 VIEW COMPARISON:  06/14/2020. FINDINGS: Suboptimal inspiration. Cardiac silhouette normal in size,  unchanged. Since the prior examination 6 days ago, the opacity in the lingula has worsened and is now confluent. The nodular opacity in the RIGHT perihilar region persists. Mild RIGHT basilar atelectasis related to the suboptimal inspiration. No new pulmonary parenchymal abnormalities otherwise. IMPRESSION: 1. Worsening lingular pneumonia since the prior examination 6 days ago as the opacities are now confluent. 2. Persistent nodular opacity at the RIGHT lung base. Non-emergent CT of the chest with contrast is recommended in further evaluation once the patient has been treated for the pneumonia to see if the nodular opacity persists. 3. Suboptimal inspiration accounts for RIGHT basilar atelectasis. Electronically Signed   By: Hulan Saas M.D.   On: 06/20/2020 12:04   DG Chest Portable 1 View  Result Date: 06/14/2020 CLINICAL DATA:  Cough shortness of breath. EXAM: PORTABLE CHEST 1 VIEW COMPARISON:  None. FINDINGS: Cardiomediastinal silhouette is normal. Mediastinal contours appear intact. There is no evidence of pleural effusion or pneumothorax. Nodular airspace opacity in the right infrahilar region. Streaky peripheral airspace opacity in the left lower lung field. Osseous structures are without acute abnormality. Soft tissues are grossly normal. IMPRESSION: 1. Nodular airspace opacity in the right infrahilar region may represent focal airspace consolidation or potentially pulmonary nodule. 2. Streaky peripheral airspace opacity in the left lower lung field may represent atelectasis or subpleural airspace consolidation. 3. Follow-up with chest CT without contrast or PA and lateral radiograph of the chest may be considered. Electronically Signed   By: Ted Mcalpine M.D.   On: 06/14/2020 11:27    Procedures  MDM   Labs w/ elevated inflammatory markers. Lactic acid @ 3.4 - fluids ordered by prior team. CXR w/ worsening pneumonia.   15:40: Re-assessed patient, SPO2 91% on RA @ rest initially  ambulated throughout exam room with tachypnea and desaturation to 81% on RA, remained @ 84% for a few minutes after returning to stretcher, 1L via [place] applied. Will give decadron & remdesivir.   16:03: CONSULT: Discussed with hospitalist Dr. Chipper Herb- accepts admission.   Rodney Mcclain was evaluated in Emergency Department on 06/20/2020 for the symptoms described in the  history of present illness. He/she was evaluated in the context of the global COVID-19 pandemic, which necessitated consideration that the patient might be at risk for infection with the SARS-CoV-2 virus that causes COVID-19. Institutional protocols and algorithms that pertain to the evaluation of patients at risk for COVID-19 are in a state of rapid change based on information released by regulatory bodies including the CDC and federal and state organizations. These policies and algorithms were followed during the patient's care in the ED.      Rodney Mcclain 06/20/20 1611    Terald Sleeper, MD 06/20/20 [id number]

## 2020-06-20 NOTE — ED Provider Notes (Signed)
[facility] EMERGENCY DEPARTMENT Provider Note   CSN: [id number] Arrival date & time: 06/20/20  1009     History Chief Complaint  Patient presents with  . Covid Positive  . Shortness of Breath    Rodney Mcclain is a 44 y.o. male with PMH significant for HTN, HLD, OSA, and positive COVID-19 testing obtained 06/14/2020 who presents to the ED for hypoxia in setting of COVID-19.  Patient reports that he first became symptomatic nearly 3 weeks ago and that he started to feel improved before taking a turn approximately 10 days ago.  Since then, he has been experiencing progressively worsening shortness of breath symptoms.  I reviewed patient's medical record and he was evaluated on 06/18/2020 for similar complaints.  Patient was not hypoxic and ultimately advised to follow-up with the COVID-19 clinic and by pulse oximeter for at home monitoring.  Patient reports that he was resting comfortably in his recliner this morning when he noted his oxygenation to be between 78 to 80% on RA.  While triage notes that [facility] reports that he was 88% on their arrival, patient maintains that he was 78% in their presence.  He was then subsequently placed on 3 L supplemental oxygen via [place].  On my examination, patient was resting comfortably on supplemental oxygen with no significant increased work of breathing.  He was mildly tachypneic to 30, but no distress.  While he denies any chest pain, he is endorsing symptoms of pleurisy.  He denies any history of clots or clotting disorder, recent surgeries or immobilization, hormone therapy, hemoptysis, or unilateral extremity swelling/edema.  Patient states that he has continued to check his temperature at home and take antipyretics as needed.  He understands that he is not a candidate for MAB therapy given chronicity of his illness.  He states that he is returning to the ER based on the return precautions given by prior ED encounter of worsening SOB and  hypoxia.    HPI     Past Medical History:  Diagnosis Date  . Anal fissure   . Anxiety and depression   . Depression   . Diverticulitis   . Gout   . Hypertension   . Obesity   . OSA on CPAP     Patient Active Problem List   Diagnosis Date Noted  . Hypothyroid 10/31/2019  . Snoring 01/12/2018  . Dyslipidemia 12/31/2017  . Vertigo 03/29/2017  . Gout flare 01/19/2014  . General medical examination 03/03/2011  . Morbid obesity (HCC) 01/31/2011  . Anxiety and depression 01/31/2011  . ANAL FISSURE 06/17/2009  . ALLERGIC RHINITIS DUE TO POLLEN 01/14/2009  . Essential hypertension 12/14/2008  . BACK PAIN, THORACIC REGION 12/14/2008    Past Surgical History:  Procedure Laterality Date  . HIP SURGERY  1986 & 1991   bilateral pin placement  . TOE SURGERY     bilateral for bone spurs       Family History  Problem Relation Age of Onset  . Arthritis Mother   . Heart disease Mother   . Hypertension Mother   . Diabetes Mother   . Sleep apnea Mother   . Arthritis Father   . Kidney cancer Father   . Arthritis Maternal Grandfather   . Stroke Maternal Grandfather   . Lung cancer Other        aunt  . Hypertension Other        all grandparents  . Diabetes Maternal Grandmother   . Sleep apnea Sister   .  Sleep apnea Brother   . Colon cancer Neg Hx   . Colon polyps Neg Hx   . Esophageal cancer Neg Hx   . Gallbladder disease Neg Hx     Social History   Tobacco Use  . Smoking status: Former Smoker    Packs/day: 1.50    Years: 18.00    Pack years: 27.00    Quit date: 10/09/2005    Years since quitting: 14.7  . Smokeless tobacco: Never Used  Vaping Use  . Vaping Use: Never used  Substance Use Topics  . Alcohol use: Yes    Alcohol/week: 0.0 standard drinks    Comment: occ.  . Drug use: No    Home Medications Prior to Admission medications   Medication Sig Start Date End Date Taking? Authorizing Provider  albuterol (VENTOLIN HFA) 108 (90 Base) MCG/ACT inhaler  Inhale 2 puffs into the lungs every 6 (six) hours as needed for wheezing or shortness of breath. 06/08/20   Sheliah Hatch, MD  benzonatate (TESSALON) 100 MG capsule Take 1 capsule (100 mg total) by mouth every 8 (eight) hours. 06/14/20   Linwood Dibbles, MD  doxycycline (VIBRA-TABS) 100 MG tablet Take 1 tablet (100 mg total) by mouth 2 (two) times daily. 06/14/20   Linwood Dibbles, MD  EUTHYROX 112 MCG tablet Take 1 tablet by mouth once daily Patient taking differently: Take 112 mcg by mouth at bedtime.  04/13/20   Sheliah Hatch, MD  ondansetron (ZOFRAN) 4 MG tablet Take 1 tablet (4 mg total) by mouth every 6 (six) hours as needed for nausea or vomiting. 06/19/20   Dione Booze, MD  Probiotic Product (PROBIOTIC PO) Take 1 capsule by mouth daily.     [provider]  simvastatin (ZOCOR) 40 MG tablet TAKE 1 TABLET BY MOUTH AT BEDTIME Patient taking differently: Take 40 mg by mouth at bedtime.  03/26/20   Sheliah Hatch, MD  valsartan (DIOVAN) 80 MG tablet Take 1 tablet (80 mg total) by mouth daily. 02/09/20   Sheliah Hatch, MD    Allergies    Patient has no known allergies.  Review of Systems   Review of Systems  All other systems reviewed and are negative.   Physical Exam Updated Vital Signs BP 117/73   Pulse 79   Temp 98.6 F (37 C) (Oral)   Resp 16   SpO2 93%   Physical Exam Vitals and nursing note reviewed. Exam conducted with a chaperone present.  Constitutional:      Appearance: He is ill-appearing.  HENT:     Head: Normocephalic and atraumatic.  Eyes:     General: No scleral icterus.    Conjunctiva/sclera: Conjunctivae normal.  Cardiovascular:     Rate and Rhythm: Normal rate and regular rhythm.     Pulses: Normal pulses.     Heart sounds: Normal heart sounds.  Pulmonary:     Comments: Mildly increased work of breathing.  No respiratory distress or accessory muscle use.  Tachypnea to 30.  Rales in lower lobes, most notably on left side.  Symmetric chest  rise.   Musculoskeletal:     Right lower leg: No edema.  Skin:    General: Skin is dry.     Capillary Refill: Capillary refill takes less than 2 seconds.  Neurological:     Mental Status: He is alert and oriented to person, place, and time.     GCS: GCS eye subscore is 4. GCS verbal subscore is 5. GCS motor subscore  is 6.  Psychiatric:        Mood and Affect: Mood normal.        Behavior: Behavior normal.        Thought Content: Thought content normal.     ED Results / Procedures / Treatments   Labs (all labs ordered are listed, but only abnormal results are displayed) Labs Reviewed  LACTIC ACID, PLASMA - Abnormal; Notable for the following components:      Result Value   Lactic Acid, Venous 3.4 (*)    All other components within normal limits  CBC WITH DIFFERENTIAL/PLATELET - Abnormal; Notable for the following components:   Platelets 131 (*)    All other components within normal limits  COMPREHENSIVE METABOLIC PANEL - Abnormal; Notable for the following components:   Glucose, Bld 100 (*)    Calcium 8.5 (*)    Total Protein 6.4 (*)    Albumin 3.2 (*)    All other components within normal limits  D-DIMER, QUANTITATIVE (NOT AT [facility]) - Abnormal; Notable for the following components:   D-Dimer, Quant 10.76 (*)    All other components within normal limits  LACTATE DEHYDROGENASE - Abnormal; Notable for the following components:   LDH 275 (*)    All other components within normal limits  FERRITIN - Abnormal; Notable for the following components:   Ferritin 440 (*)    All other components within normal limits  FIBRINOGEN - Abnormal; Notable for the following components:   Fibrinogen 492 (*)    All other components within normal limits  C-REACTIVE PROTEIN - Abnormal; Notable for the following components:   CRP 8.4 (*)    All other components within normal limits  CULTURE, BLOOD (ROUTINE X 2)  CULTURE, BLOOD (ROUTINE X 2)  TRIGLYCERIDES  LACTIC ACID, PLASMA  PROCALCITONIN      EKG EKG Interpretation  Date/Time:  Sunday June 20 2020 12:21:33 EDT Ventricular Rate:  83 PR Interval:    QRS Duration: 97 QT Interval:  370 QTC Calculation: 435 R Axis:   7 Text Interpretation: Sinus rhythm Atrial premature complex Low voltage, precordial leads No STEMI Confirmed by Alvester Chou [phone]) on 06/20/2020 1:45:22 PM   Radiology DG Chest Portable 1 View  Result Date: 06/20/2020 CLINICAL DATA:  44 year old who tested positive for COVID-19 6 days ago and has had 2 weeks of shortness of breath. Patient presents with acute worsening of shortness of breath and hypoxemia (oxygen saturation 80% on room air this morning). EXAM: PORTABLE CHEST 1 VIEW COMPARISON:  06/14/2020. FINDINGS: Suboptimal inspiration. Cardiac silhouette normal in size, unchanged. Since the prior examination 6 days ago, the opacity in the lingula has worsened and is now confluent. The nodular opacity in the RIGHT perihilar region persists. Mild RIGHT basilar atelectasis related to the suboptimal inspiration. No new pulmonary parenchymal abnormalities otherwise. IMPRESSION: 1. Worsening lingular pneumonia since the prior examination 6 days ago as the opacities are now confluent. 2. Persistent nodular opacity at the RIGHT lung base. Non-emergent CT of the chest with contrast is recommended in further evaluation once the patient has been treated for the pneumonia to see if the nodular opacity persists. 3. Suboptimal inspiration accounts for RIGHT basilar atelectasis. Electronically Signed   By: Hulan Saas M.D.   On: 06/20/2020 12:04    Procedures Procedures (including critical care time)  Medications Ordered in ED Medications  sodium chloride 0.9 % bolus 500 mL (has no administration in time range)  dexamethasone (DECADRON) injection 6 mg (has no administration in  time range)  remdesivir 200 mg in sodium chloride 0.9% 250 mL IVPB (has no administration in time range)    Followed by  remdesivir  100 mg in sodium chloride 0.9 % 100 mL IVPB (has no administration in time range)    ED Course  I have reviewed the triage vital signs and the nursing notes.  Pertinent labs & imaging results that were available during my care of the patient were reviewed by me and considered in my medical decision making (see chart for details).    MDM Rules/Calculators/A&P                          Patient was advised to return to the ED for worsening shortness of breath or hypoxia and patient reports that he has had both.  He states that he was 78% on his pulse oximeter at home while resting comfortably in his recliner.  During my examination, patient is exhibiting tachypnea with mild increased work of breathing, however no distress or hypoxia.  We will take him off of his supplemental 3 L O2 via [place] to see if he once again becomes hypoxic.  Patient states that he has been using his albuterol as prescribed by his primary care provider, with no relief.  Will obtain preadmission COVID-19 labs.  DG chest is personally reviewed which demonstrates a worsening lingular pneumonia since the prior imaging with confluence of previously noted opacities.  Patient reported hypoxia at home, he has been persistently in the low 90s here in the ED.  However, this is his third encounter for SOB in context of COVID-19.    [facility] will follow pending studies, re-evaluate, and determine disposition.    Rodney Mcclain was evaluated in Emergency Department on 06/20/2020 for the symptoms described in the history of present illness. He was evaluated in the context of the global COVID-19 pandemic, which necessitated consideration that the patient might be at risk for infection with the SARS-CoV-2 virus that causes COVID-19. Institutional protocols and algorithms that pertain to the evaluation of patients at risk for COVID-19 are in a state of rapid change based on information  released by regulatory bodies including the CDC and federal and state organizations. These policies and algorithms were followed during the patient's care in the ED.  Final Clinical Impression(s) / ED Diagnoses Final diagnoses:  COVID-19  Acute hypoxemic respiratory failure [facility](HCC)    Rx / DC Orders ED Discharge Orders    None       Lorelee NewGreen, Daryana Whirley L, PA-C 06/20/20 1553    Terald Sleeperrifan, Matthew J, MD 06/20/20 1654

## 2020-06-20 NOTE — ED Notes (Signed)
Pt ambulated in room on room air, his O2 saturations remained at 95%. Pt had no complaints of feeling SOB. Pt is back in bed and resting comfortably.

## 2020-06-20 NOTE — H&P (Signed)
History and Physical    Echo [place] EVO:[id number] DOB: Feb 26, 1976 DOA: 06/20/2020  PCP: Sheliah Hatch, MD (Confirm with patient/family/NH records and if not entered, this has to be entered at [facility] point of entry) Patient coming from: Home  I have personally briefly reviewed patient's old medical records in [facility]  Chief Complaint: SOB  HPI: Rodney Mcclain is a 44 y.o. male with medical history significant of HTN, morbid obesity, hypothyroidism, OSA on CPAP, presented with increasing short of breath fever diarrhea.  Symptoms started about 3 weeks ago, underwent COVID-19 testing negative x4.  On 09/06 came to emergency room for worsening of shortness of breath and dry cough, was tested positive for COVID-19 for the first time.  Discharged home with doxycycline for 7 days which he took, despite, his breathing symptoms not improving and started to have diarrhea 4-5 times a day loose to watery for the last week, no abdominal pain no nauseous vomit.  Spiking fever 102 yesterday and today.  Not vaccinated for COVID-19 for personal reasons.  Denies any chest pain, no abdominal pain, and patient has been taking Imodium for diarrhea for the last 3 days with somewhat improvement.  Had loss of taste and muscle aching for the first week which resolved. ED Course: O2 saturation 87% room air, stabilized on 2 L-94%, desat easily with minimum activity, D-dimer 10, WBC 8.7, CRP 3.5, chest x-ray showed worsening of left lower lobe infiltrates.  Review of Systems: As per HPI otherwise 14 point review of systems negative.    Past Medical History:  Diagnosis Date  . Anal fissure   . Anxiety and depression   . Depression   . Diverticulitis   . Gout   . Hypertension   . Obesity   . OSA on CPAP     Past Surgical History:  Procedure Laterality Date  . HIP SURGERY  1986 & 1991   bilateral pin placement  . TOE SURGERY     bilateral for bone spurs     reports that he quit smoking about 14  years ago. He has a 27.00 pack-year smoking history. He has never used smokeless tobacco. He reports current alcohol use. He reports that he does not use drugs.  No Known Allergies  Family History  Problem Relation Age of Onset  . Arthritis Mother   . Heart disease Mother   . Hypertension Mother   . Diabetes Mother   . Sleep apnea Mother   . Arthritis Father   . Kidney cancer Father   . Arthritis Maternal Grandfather   . Stroke Maternal Grandfather   . Lung cancer Other        aunt  . Hypertension Other        all grandparents  . Diabetes Maternal Grandmother   . Sleep apnea Sister   . Sleep apnea Brother   . Colon cancer Neg Hx   . Colon polyps Neg Hx   . Esophageal cancer Neg Hx   . Gallbladder disease Neg Hx     Prior to Admission medications   Medication Sig Start Date End Date Taking? Authorizing Provider  albuterol (VENTOLIN HFA) 108 (90 Base) MCG/ACT inhaler Inhale 2 puffs into the lungs every 6 (six) hours as needed for wheezing or shortness of breath. 06/08/20   Sheliah Hatch, MD  benzonatate (TESSALON) 100 MG capsule Take 1 capsule (100 mg total) by mouth every 8 (eight) hours. 06/14/20   Linwood Dibbles, MD  doxycycline (VIBRA-TABS) 100 MG tablet  Take 1 tablet (100 mg total) by mouth 2 (two) times daily. 06/14/20   Linwood Dibbles, MD  EUTHYROX 112 MCG tablet Take 1 tablet by mouth once daily Patient taking differently: Take 112 mcg by mouth at bedtime.  04/13/20   Sheliah Hatch, MD  ondansetron (ZOFRAN) 4 MG tablet Take 1 tablet (4 mg total) by mouth every 6 (six) hours as needed for nausea or vomiting. 06/19/20   Dione Booze, MD  Probiotic Product (PROBIOTIC PO) Take 1 capsule by mouth daily.     [provider]  simvastatin (ZOCOR) 40 MG tablet TAKE 1 TABLET BY MOUTH AT BEDTIME Patient taking differently: Take 40 mg by mouth at bedtime.  03/26/20   Sheliah Hatch, MD  valsartan (DIOVAN) 80 MG tablet Take 1 tablet (80 mg total) by mouth daily. 02/09/20    Sheliah Hatch, MD    Physical Exam: Vitals:   06/20/20 1245 06/20/20 1330 06/20/20 1345 06/20/20 1515  BP: (!) 107/93   117/73  Pulse: 82 82 85 79  Resp: 19 (!) 21 (!) 24 16  Temp:      TempSrc:      SpO2: 93% 90% 91% 93%    Constitutional: NAD, calm, comfortable Vitals:   06/20/20 1245 06/20/20 1330 06/20/20 1345 06/20/20 1515  BP: (!) 107/93   117/73  Pulse: 82 82 85 79  Resp: 19 (!) 21 (!) 24 16  Temp:      TempSrc:      SpO2: 93% 90% 91% 93%   Eyes: PERRL, lids and conjunctivae normal ENMT: Mucous membranes are dry. Posterior pharynx clear of any exudate or lesions.Normal dentition.  Neck: normal, supple, no masses, no thyromegaly Respiratory: clear to auscultation bilaterally, no wheezing, no crackles. Normal respiratory effort. No accessory muscle use.  Cardiovascular: Regular rate and rhythm, no murmurs / rubs / gallops. No extremity edema. 2+ pedal pulses. No carotid bruits.  Abdomen: no tenderness, no masses palpated. No hepatosplenomegaly. Bowel sounds positive.  Musculoskeletal: no clubbing / cyanosis. No joint deformity upper and lower extremities. Good ROM, no contractures. Normal muscle tone.  Skin: no rashes, lesions, ulcers. No induration Neurologic: CN 2-12 grossly intact. Sensation intact, DTR normal. Strength 5/5 in all 4.  Psychiatric: Normal judgment and insight. Alert and oriented x 3. Normal mood.     Labs on Admission: I have personally reviewed following labs and imaging studies  CBC: Recent Labs  Lab 06/14/20 1147 06/18/20 1544 06/20/20 1431  WBC 5.5 6.9 8.7  NEUTROABS 4.0  --  7.1  HGB 16.3 15.3 13.6  HCT 47.4 45.6 41.6  MCV 94.4 94.2 96.1  PLT 128* 131* 131*   Basic Metabolic Panel: Recent Labs  Lab 06/14/20 1147 06/18/20 1544 06/20/20 1431  NA 135 135 137  K 4.2 4.1 4.2  CL 101 101 102  CO2 24 24 23   GLUCOSE 103* 108* 100*  BUN 16 12 11   CREATININE 1.28* 1.27* 1.13  CALCIUM 8.7* 8.7* 8.5*   GFR: Estimated  Creatinine Clearance: 126.2 mL/min (by C-G formula based on SCr of 1.13 mg/dL). Liver Function Tests: Recent Labs  Lab 06/18/20 1544 06/20/20 1431  AST 27 28  ALT 17 15  ALKPHOS 62 50  BILITOT 0.8 0.7  PROT 7.0 6.4*  ALBUMIN 3.7 3.2*   No results for input(s): LIPASE, AMYLASE in the last 168 hours. No results for input(s): AMMONIA in the last 168 hours. Coagulation Profile: No results for input(s): INR, PROTIME in the last 168 hours.  Cardiac Enzymes: No results for input(s): CKTOTAL, CKMB, CKMBINDEX, TROPONINI in the last 168 hours. BNP (last 3 results) No results for input(s): PROBNP in the last 8760 hours. HbA1C: No results for input(s): HGBA1C in the last 72 hours. CBG: No results for input(s): GLUCAP in the last 168 hours. Lipid Profile: Recent Labs    06/20/20 1431  TRIG 76   Thyroid Function Tests: No results for input(s): TSH, T4TOTAL, FREET4, T3FREE, THYROIDAB in the last 72 hours. Anemia Panel: Recent Labs    06/20/20 1431  FERRITIN 440*   Urine analysis:    Component Value Date/Time   COLORURINE YELLOW 01/14/2009 1111   APPEARANCEUR CLEAR 01/14/2009 1111   LABSPEC 1.025 01/14/2009 1111   PHURINE 6.0 01/14/2009 1111   GLUCOSEU NEGATIVE 01/14/2009 1111   BILIRUBINUR negative 11/19/2013 1322   KETONESUR NEGATIVE 01/14/2009 1111   PROTEINUR negative 11/19/2013 1322   UROBILINOGEN 0.2 11/19/2013 1322   UROBILINOGEN 0.2 01/14/2009 1111   NITRITE negative 11/19/2013 1322   NITRITE NEGATIVE 01/14/2009 1111   LEUKOCYTESUR Trace 11/19/2013 1322    Radiological Exams on Admission: DG Chest Portable 1 View  Result Date: 06/20/2020 CLINICAL DATA:  44 year old who tested positive for COVID-19 6 days ago and has had 2 weeks of shortness of breath. Patient presents with acute worsening of shortness of breath and hypoxemia (oxygen saturation 80% on room air this morning). EXAM: PORTABLE CHEST 1 VIEW COMPARISON:  06/14/2020. FINDINGS: Suboptimal inspiration.  Cardiac silhouette normal in size, unchanged. Since the prior examination 6 days ago, the opacity in the lingula has worsened and is now confluent. The nodular opacity in the RIGHT perihilar region persists. Mild RIGHT basilar atelectasis related to the suboptimal inspiration. No new pulmonary parenchymal abnormalities otherwise. IMPRESSION: 1. Worsening lingular pneumonia since the prior examination 6 days ago as the opacities are now confluent. 2. Persistent nodular opacity at the RIGHT lung base. Non-emergent CT of the chest with contrast is recommended in further evaluation once the patient has been treated for the pneumonia to see if the nodular opacity persists. 3. Suboptimal inspiration accounts for RIGHT basilar atelectasis. Electronically Signed   By: Hulan Saas M.D.   On: 06/20/2020 12:04    EKG: Independently reviewed. Low voltage  Assessment/Plan Active Problems:   COVID-19 virus infection   COVID-19  (please populate well all problems here in Problem List. (For example, if patient is on BP meds at home and you resume or decide to hold them, it is a problem that needs to be her. Same for CAD, COPD, HLD and so on)  Acute hypoxic respite failure secondary to COVID-19 pneumonia -Start cocktail of remdesivir, steroid and Olumiant -Encourage prone position the frequent position changes -Other supportive measures including breathing meds and antitussives -Patient completed 6 days of p.o. doxycycline, no leukocytosis, not currently considering add CAP treatment. -Repeat chest x-ray in 4 weeks to document resolution of LLL infiltrates  Elevated D-dimer -Along with significant elevation of other acute inflammatory marker, will trend and if persistently elevated will consider CT angiogram -DVT prophylaxis  OSA -CPAP  HTN -Blood pressure borderline low, hold BP meds for today -IV fluids x1 day  Hypothyroidism -Continue Synthroid  Morbid obesity -Diet exercise  HLD -On  statin  DVT prophylaxis: Lovenox Code Status: Full code Family Communication: None at bedside Disposition Plan: Expect 2 to 3 days hospital stay for COVID-19 treatment Consults called: None Admission status: Tele admit   Emeline General MD [facility] Pager [phone]  06/20/2020, 4:39 PM

## 2020-06-21 ENCOUNTER — Inpatient Hospital Stay (HOSPITAL_COMMUNITY): Payer: BC Managed Care – PPO

## 2020-06-21 DIAGNOSIS — U071 COVID-19: Secondary | ICD-10-CM

## 2020-06-21 DIAGNOSIS — R7989 Other specified abnormal findings of blood chemistry: Secondary | ICD-10-CM

## 2020-06-21 LAB — COMPREHENSIVE METABOLIC PANEL
ALT: 15 U/L (ref 0–44)
AST: 24 U/L (ref 15–41)
Albumin: 2.9 g/dL — ABNORMAL LOW (ref 3.5–5.0)
Alkaline Phosphatase: 45 U/L (ref 38–126)
Anion gap: 9 (ref 5–15)
BUN: 12 mg/dL (ref 6–20)
CO2: 26 mmol/L (ref 22–32)
Calcium: 8.5 mg/dL — ABNORMAL LOW (ref 8.9–10.3)
Chloride: 106 mmol/L (ref 98–111)
Creatinine, Ser: 1.21 mg/dL (ref 0.61–1.24)
GFR calc Af Amer: >60 mL/min (ref >60)
GFR calc non Af Amer: >60 mL/min (ref >60)
Glucose, Bld: 144 mg/dL — ABNORMAL HIGH (ref 70–99)
Potassium: 4.4 mmol/L (ref 3.5–5.1)
Sodium: 141 mmol/L (ref 135–145)
Total Bilirubin: 0.4 mg/dL (ref 0.3–1.2)
Total Protein: 6.1 g/dL — ABNORMAL LOW (ref 6.5–8.1)

## 2020-06-21 LAB — FERRITIN: Ferritin: 465 ng/mL — ABNORMAL HIGH (ref 24–336)

## 2020-06-21 LAB — CBC WITH DIFFERENTIAL/PLATELET
Abs Immature Granulocytes: 0.02 10*3/uL (ref 0.00–0.07)
Basophils Absolute: 0 10*3/uL (ref 0.0–0.1)
Basophils Relative: 0 %
Eosinophils Absolute: 0 10*3/uL (ref 0.0–0.5)
Eosinophils Relative: 0 %
HCT: 39.2 % (ref 39.0–52.0)
Hemoglobin: 13.5 g/dL (ref 13.0–17.0)
Immature Granulocytes: 0 %
Lymphocytes Relative: 15 %
Lymphs Abs: 0.7 10*3/uL (ref 0.7–4.0)
MCH: 32.5 pg (ref 26.0–34.0)
MCHC: 34.4 g/dL (ref 30.0–36.0)
MCV: 94.5 fL (ref 80.0–100.0)
Monocytes Absolute: 0.2 10*3/uL (ref 0.1–1.0)
Monocytes Relative: 3 %
Neutro Abs: 3.9 10*3/uL (ref 1.7–7.7)
Neutrophils Relative %: 82 %
Platelets: 124 10*3/uL — ABNORMAL LOW (ref 150–400)
RBC: 4.15 MIL/uL — ABNORMAL LOW (ref 4.22–5.81)
RDW: 13.1 % (ref 11.5–15.5)
WBC: 4.8 10*3/uL (ref 4.0–10.5)
nRBC: 0 % (ref 0.0–0.2)

## 2020-06-21 LAB — GLUCOSE, CAPILLARY
Glucose-Capillary: 178 mg/dL — ABNORMAL HIGH (ref 70–99)
Glucose-Capillary: 144 mg/dL — ABNORMAL HIGH (ref 70–99)
Glucose-Capillary: 158 mg/dL — ABNORMAL HIGH (ref 70–99)

## 2020-06-21 LAB — HEMOGLOBIN A1C
Hgb A1c MFr Bld: 5.5 % (ref 4.8–5.6)
Mean Plasma Glucose: 111.15 mg/dL

## 2020-06-21 LAB — PROCALCITONIN: Procalcitonin: <0.1 ng/mL

## 2020-06-21 LAB — BRAIN NATRIURETIC PEPTIDE: B Natriuretic Peptide: 42.8 pg/mL (ref 0.0–100.0)

## 2020-06-21 LAB — CBG MONITORING, ED: Glucose-Capillary: 133 mg/dL — ABNORMAL HIGH (ref 70–99)

## 2020-06-21 LAB — PHOSPHORUS: Phosphorus: 2.8 mg/dL (ref 2.5–4.6)

## 2020-06-21 LAB — D-DIMER, QUANTITATIVE: D-Dimer, Quant: 10.89 ug/mL-FEU — ABNORMAL HIGH (ref 0.00–0.50)

## 2020-06-21 LAB — C-REACTIVE PROTEIN: CRP: 9.2 mg/dL — ABNORMAL HIGH (ref <1.0)

## 2020-06-21 LAB — MAGNESIUM: Magnesium: 2.1 mg/dL (ref 1.7–2.4)

## 2020-06-21 MED ORDER — INSULIN ASPART 100 UNIT/ML ~~LOC~~ SOLN: 0.0000 [IU] | Freq: Every day | SUBCUTANEOUS | Status: DC

## 2020-06-21 MED ORDER — INSULIN ASPART 100 UNIT/ML ~~LOC~~ SOLN
0.0000 [IU] | Freq: Three times a day (TID) | SUBCUTANEOUS | Status: DC
  Administered 2020-06-21: 2 [IU] via SUBCUTANEOUS
  Administered 2020-06-21: 3 [IU] via SUBCUTANEOUS
  Administered 2020-06-21: 2 [IU] via SUBCUTANEOUS
  Administered 2020-06-22 – 2020-06-23 (×3): 3 [IU] via SUBCUTANEOUS
  Administered 2020-06-23: 2 [IU] via SUBCUTANEOUS
  Administered 2020-06-23: 3 [IU] via SUBCUTANEOUS
  Administered 2020-06-24 – 2020-06-25 (×4): 2 [IU] via SUBCUTANEOUS

## 2020-06-21 MED ORDER — ENOXAPARIN SODIUM 150 MG/ML ~~LOC~~ SOLN
150.0000 mg | Freq: Two times a day (BID) | SUBCUTANEOUS | Status: DC
  Administered 2020-06-21 – 2020-06-23 (×5): 150 mg via SUBCUTANEOUS
  Filled 2020-06-21 (×6): qty 1

## 2020-06-21 MED ORDER — METHYLPREDNISOLONE SODIUM SUCC 125 MG IJ SOLR
80.0000 mg | Freq: Two times a day (BID) | INTRAMUSCULAR | Status: DC
  Administered 2020-06-21 – 2020-06-23 (×5): 80 mg via INTRAVENOUS
  Filled 2020-06-21 (×5): qty 2

## 2020-06-21 MED ORDER — PANTOPRAZOLE SODIUM 40 MG PO TBEC
40.0000 mg | DELAYED_RELEASE_TABLET | Freq: Every day | ORAL | Status: DC
  Administered 2020-06-21 – 2020-06-26 (×6): 40 mg via ORAL
  Filled 2020-06-21 (×6): qty 1

## 2020-06-21 NOTE — Progress Notes (Addendum)
PROGRESS NOTE                                                                                                                                                                                                             Patient Demographics:    Rodney Mcclain, is a 44 y.o. male, DOB - 1976/05/15, ZOX:[id number]  Outpatient Primary MD for the patient is Sheliah Hatchabori, Rodney E, MD    LOS - 1  Admit date - 06/20/2020    Chief Complaint  Patient presents with  . Covid Positive  . Shortness of Breath       Brief Narrative - Rodney PearRoger Shatz is a 44 y.o. male with medical history significant of HTN, morbid obesity, hypothyroidism, OSA on CPAP, presented with increasing short of breath fever diarrhea.  Symptoms started about 3 weeks ago, he was diagnosed with acute hypoxic respiratory failure due to COVID-19 pneumonia and admitted to the hospital.   Subjective:    Rodney Pearoger Alix today has, No headache, No chest pain, No abdominal pain - No Nausea, No new weakness tingling or numbness, +ve Cough & SOB.     Assessment  & Plan :     1. Acute Hypoxic Resp. Failure due to Acute Covid 19 Viral Pneumonitis during the ongoing 2020 Covid 19 Pandemic - he is unfortunately not vaccinated and has incurred severe parenchymal lung injury, he has been placed appropriately on combination of IV steroids, remdesivir and Baricitinib after appropriate consent.  Will monitor him closely.  Encouraged the patient to sit up in chair in the daytime use I-S and flutter valve for pulmonary toiletry and then prone in bed when at night.  Will advance activity and titrate down oxygen as possible.    SpO2: (!) 89 % O2 Flow Rate (L/min): 5 L/min  Recent Labs  Lab 06/14/20 1147 06/18/20 1544 06/20/20 1431 06/20/20 2150 06/21/20 0500 06/21/20 0712  WBC 5.5 6.9 8.7  --  4.8  --   PLT 128* 131* 131*  --  124*  --   CRP  --   --  8.4*  --  9.2*  --   DDIMER  --    --  10.76*  --  10.89*  --   PROCALCITON  --   --  <0.10  --   --  <0.10  AST  --  27 28  --  24  --   ALT  --  17 15  --  15  --   ALKPHOS  --  62 50  --  45  --   BILITOT  --  0.8 0.7  --  0.4  --   ALBUMIN  --  3.7 3.2*  --  2.9*  --   LATICACIDVEN  --   --  3.4* 1.9  --   --   SARSCOV2NAA POSITIVE*  --   --   --   --   --       2.  Morbid obesity with BMI of over 40.  Follow with PCP, with sleep apnea, will start CPAP nightly..  3.  Hypothyroidism.  On Synthroid.  4.  Dyslipidemia.  Placed on home dose statin.  5.  Hypertension.  Placed on Norvasc.  6.  Extremely elevated D-dimer.  Very high risk for developing a blood clot due to intense inflammation, check leg ultrasound, CTA once he is more stable, for now full dose Lovenox.   Condition - Extremely Guarded  Family Communication  : Wife Rodney [phone] on 06/21/2020.  Code Status :  Full  Consults  :  None  Procedures  :    Leg [place] - Bilat leg Acute DVT  CTA -    PUD Prophylaxis : PPI  Disposition Plan  :    Status is: Inpatient  Remains inpatient appropriate because:IV treatments appropriate due to intensity of illness or inability to take PO   Dispo: The patient is from: Home              Anticipated d/c is to: Home              Anticipated d/c date is: > 3 days              Patient currently is not medically stable to d/c.  DVT Prophylaxis  :  Lovenox    Lab Results  Component Value Date   PLT 124 (L) 06/21/2020    Diet :  Diet Order            Diet Heart Room service appropriate? Yes; Fluid consistency: Thin  Diet effective now                  Inpatient Medications  Scheduled Meds: . acidophilus  1 capsule Oral Daily  . albuterol  2 puff Inhalation Q6H  . baricitinib  4 mg Oral Daily  . benzonatate  100 mg Oral Q8H  . enoxaparin (LOVENOX) injection  150 mg Subcutaneous BID  . insulin aspart  0-15 Units Subcutaneous TID WC  . insulin aspart  0-5 Units Subcutaneous QHS  .  lactobacillus acidophilus  2 tablet Oral TID  . levothyroxine  112 mcg Oral QHS  . methylPREDNISolone (SOLU-MEDROL) injection  80 mg Intravenous Q12H  . simvastatin  40 mg Oral QHS   Continuous Infusions: . remdesivir 100 mg in NS 100 mL     PRN Meds:.acetaminophen, guaiFENesin-dextromethorphan, loperamide, ondansetron  Antibiotics  :    Anti-infectives (From admission, onward)   Start     Dose/Rate Route Frequency Ordered Stop   06/21/20 1000  remdesivir 100 mg in sodium chloride 0.9 % 100 mL IVPB  Status:  Discontinued       "Followed by" Linked Group Details   100 mg 200 mL/hr over 30 Minutes Intravenous Daily 06/20/20 1547 06/20/20 1900   06/21/20 1000  remdesivir 100 mg in  sodium chloride 0.9 % 100 mL IVPB       "Followed by" Linked Group Details   100 mg 200 mL/hr over 30 Minutes Intravenous Daily 06/20/20 1900 06/25/20 0959   06/20/20 1915  remdesivir 200 mg in sodium chloride 0.9% 250 mL IVPB       "Followed by" Linked Group Details   200 mg 580 mL/hr over 30 Minutes Intravenous Once 06/20/20 1900 06/20/20 2259   06/20/20 1600  remdesivir 200 mg in sodium chloride 0.9% 250 mL IVPB  Status:  Discontinued       "Followed by" Linked Group Details   200 mg 580 mL/hr over 30 Minutes Intravenous Once 06/20/20 1547 06/20/20 1900       Time Spent in minutes  30   Susa Raring M.D on 06/21/2020 at 11:15 AM  To page go to [email or web address] - password [email or web address]  [facility] -  Office  [phone]   See all Orders from today for further details    Objective:   Vitals:   06/21/20 0845 06/21/20 0945 06/21/20 0945 06/21/20 1112  BP:    125/85  Pulse: 77 73  89  Resp: (!) 23 (!) 27    Temp:   98.3 F (36.8 C) 97.8 F (36.6 C)  TempSrc:   Oral Oral  SpO2: 90% 94%  (!) 89%    Wt Readings from Last 3 Encounters:  06/14/20 (!) 156.5 kg  02/11/20 (!) 158.8 kg  01/26/20 (!) 161 kg     Intake/Output Summary (Last 24 hours) at 06/21/2020 1115 Last data filed at  06/20/2020 2259 Gross per 24 hour  Intake 250 ml  Output 500 ml  Net -250 ml     Physical Exam  Awake Alert, No new F.N deficits, Normal affect .AT,PERRAL Supple Neck,No JVD, No cervical lymphadenopathy appriciated.  Symmetrical Chest wall movement, Good air movement bilaterally, CTAB RRR,No Gallops,Rubs or new Murmurs, No Parasternal Heave +ve B.Sounds, Abd Soft, No tenderness, No organomegaly appriciated, No rebound - guarding or rigidity. No Cyanosis, Clubbing or edema, No new Rash or bruise      Data Review:    CBC Recent Labs  Lab 06/14/20 1147 06/18/20 1544 06/20/20 1431 06/21/20 0500  WBC 5.5 6.9 8.7 4.8  HGB 16.3 15.3 13.6 13.5  HCT 47.4 45.6 41.6 39.2  PLT 128* 131* 131* 124*  MCV 94.4 94.2 96.1 94.5  MCH 32.5 31.6 31.4 32.5  MCHC 34.4 33.6 32.7 34.4  RDW 12.9 13.0 13.2 13.1  LYMPHSABS 0.9  --  1.1 0.7  MONOABS 0.5  --  0.4 0.2  EOSABS 0.0  --  0.0 0.0  BASOSABS 0.0  --  0.0 0.0    Recent Labs  Lab 06/14/20 1147 06/18/20 1544 06/20/20 1431 06/20/20 2150 06/21/20 0500 06/21/20 0712 06/21/20 0731  NA 135 135 137  --  141  --   --   K 4.2 4.1 4.2  --  4.4  --   --   CL 101 101 102  --  106  --   --   CO2 24 24 23   --  26  --   --   GLUCOSE 103* 108* 100*  --  144*  --   --   BUN 16 12 11   --  12  --   --   CREATININE 1.28* 1.27* 1.13  --  1.21  --   --   CALCIUM 8.7* 8.7* 8.5*  --  8.5*  --   --  AST  --  27 28  --  24  --   --   ALT  --  17 15  --  15  --   --   ALKPHOS  --  62 50  --  45  --   --   BILITOT  --  0.8 0.7  --  0.4  --   --   ALBUMIN  --  3.7 3.2*  --  2.9*  --   --   MG  --   --   --   --  2.1  --   --   CRP  --   --  8.4*  --  9.2*  --   --   DDIMER  --   --  10.76*  --  10.89*  --   --   PROCALCITON  --   --  <0.10  --   --  <0.10  --   LATICACIDVEN  --   --  3.4* 1.9  --   --   --   HGBA1C  --   --   --   --   --   --  5.5     ------------------------------------------------------------------------------------------------------------------ Recent Labs    06/20/20 1431  TRIG 76    Lab Results  Component Value Date   HGBA1C 5.5 06/21/2020   ------------------------------------------------------------------------------------------------------------------ No results for input(s): TSH, T4TOTAL, T3FREE, THYROIDAB in the last 72 hours.  Invalid input(s): FREET3  Cardiac Enzymes No results for input(s): CKMB, TROPONINI, MYOGLOBIN in the last 168 hours.  Invalid input(s): CK ------------------------------------------------------------------------------------------------------------------ No results found for: BNP  Micro Results Recent Results (from the past 240 hour(s))  SARS Coronavirus 2 by RT PCR (hospital order, performed in [facility] hospital lab) Nasopharyngeal Nasopharyngeal Swab     Status: Abnormal   Collection Time: 06/14/20 11:47 AM   Specimen: Nasopharyngeal Swab  Result Value Ref Range Status   SARS Coronavirus 2 POSITIVE (A) NEGATIVE Final    Comment: RESULT CALLED TO, READ BACK BY AND VERIFIED WITH: CALLED TO R.MARCUM RN AT 1252 BY SROY ON [id number] (NOTE) SARS-CoV-2 target nucleic acids are DETECTED  SARS-CoV-2 RNA is generally detectable in upper respiratory specimens  during the acute phase of infection.  Positive results are indicative  of the presence of the identified virus, but do not rule out bacterial infection or co-infection with other pathogens not detected by the test.  Clinical correlation with patient history and  other diagnostic information is necessary to determine patient infection status.  The expected result is negative.  Fact Sheet for Patients:   BoilerBrush.com.cy   Fact Sheet for Healthcare Providers:   https://pope.com/    This test is not yet approved or cleared by the [place] FDA and  has been  authorized for detection and/or diagnosis of SARS-CoV-2 by FDA under an Emergency Use Authorization (EUA).  This EUA will remain in effect (me aning this test can be used) for the duration of  the COVID-19 declaration under Section 564(b)(1) of the Act, 21 U.S.C. section 360-bbb-3(b)(1), unless the authorization is terminated or revoked sooner.  Performed at [facility], [address] [place], [place] [address]     Radiology Reports DG Chest [place] 1 View  Result Date: 06/21/2020 CLINICAL DATA:  Pneumonia.  COVID-19 positive. EXAM: PORTABLE CHEST 1 VIEW COMPARISON:  06/20/2020 FINDINGS: Progression of left lower lobe infiltrate.  No effusion Mild right lower lobe infiltrate with mild progression. Decreased lung volume. Negative for  heart failure. IMPRESSION: Progression of left lower lobe infiltrate compatible with pneumonia. Mild right lower lobe infiltrate also with mild progression. Electronically Signed   By: Marlan Palau M.D.   On: 06/21/2020 08:38   DG Chest Portable 1 View  Result Date: 06/20/2020 CLINICAL DATA:  44 year old who tested positive for COVID-19 6 days ago and has had 2 weeks of shortness of breath. Patient presents with acute worsening of shortness of breath and hypoxemia (oxygen saturation 80% on room air this morning). EXAM: PORTABLE CHEST 1 VIEW COMPARISON:  06/14/2020. FINDINGS: Suboptimal inspiration. Cardiac silhouette normal in size, unchanged. Since the prior examination 6 days ago, the opacity in the lingula has worsened and is now confluent. The nodular opacity in the RIGHT perihilar region persists. Mild RIGHT basilar atelectasis related to the suboptimal inspiration. No new pulmonary parenchymal abnormalities otherwise. IMPRESSION: 1. Worsening lingular pneumonia since the prior examination 6 days ago as the opacities are now confluent. 2. Persistent nodular opacity at the RIGHT lung base. Non-emergent CT of the chest with contrast is recommended in  further evaluation once the patient has been treated for the pneumonia to see if the nodular opacity persists. 3. Suboptimal inspiration accounts for RIGHT basilar atelectasis. Electronically Signed   By: Hulan Saas M.D.   On: 06/20/2020 12:04   DG Chest Portable 1 View  Result Date: 06/14/2020 CLINICAL DATA:  Cough shortness of breath. EXAM: PORTABLE CHEST 1 VIEW COMPARISON:  None. FINDINGS: Cardiomediastinal silhouette is normal. Mediastinal contours appear intact. There is no evidence of pleural effusion or pneumothorax. Nodular airspace opacity in the right infrahilar region. Streaky peripheral airspace opacity in the left lower lung field. Osseous structures are without acute abnormality. Soft tissues are grossly normal. IMPRESSION: 1. Nodular airspace opacity in the right infrahilar region may represent focal airspace consolidation or potentially pulmonary nodule. 2. Streaky peripheral airspace opacity in the left lower lung field may represent atelectasis or subpleural airspace consolidation. 3. Follow-up with chest CT without contrast or PA and lateral radiograph of the chest may be considered. Electronically Signed   By: Ted Mcalpine M.D.   On: 06/14/2020 11:27

## 2020-06-21 NOTE — ED Notes (Signed)
Pt O2 saturation dropping to 85% on 2L [place] while sleeping. Pt placed on 4LNC. O2 improved to 94%

## 2020-06-21 NOTE — ED Notes (Signed)
Attempted report 

## 2020-06-21 NOTE — Progress Notes (Addendum)
Bilateral lower extremity venous duplex completed. Refer to "CV Proc" under chart review to view preliminary results.  Preliminary results discussed with Dr. Thedore Mins.  06/21/2020 2:34 PM Eula Fried., MHA, RVT, RDCS, RDMS

## 2020-06-22 LAB — CBC WITH DIFFERENTIAL/PLATELET
Abs Immature Granulocytes: 0.04 10*3/uL (ref 0.00–0.07)
Basophils Absolute: 0 10*3/uL (ref 0.0–0.1)
Basophils Relative: 0 %
Eosinophils Absolute: 0 10*3/uL (ref 0.0–0.5)
Eosinophils Relative: 0 %
HCT: 39.8 % (ref 39.0–52.0)
Hemoglobin: 13.2 g/dL (ref 13.0–17.0)
Immature Granulocytes: 1 %
Lymphocytes Relative: 10 %
Lymphs Abs: 0.8 10*3/uL (ref 0.7–4.0)
MCH: 31.1 pg (ref 26.0–34.0)
MCHC: 33.2 g/dL (ref 30.0–36.0)
MCV: 93.6 fL (ref 80.0–100.0)
Monocytes Absolute: 0.2 10*3/uL (ref 0.1–1.0)
Monocytes Relative: 3 %
Neutro Abs: 6.8 10*3/uL (ref 1.7–7.7)
Neutrophils Relative %: 86 %
Platelets: 163 10*3/uL (ref 150–400)
RBC: 4.25 MIL/uL (ref 4.22–5.81)
RDW: 12.8 % (ref 11.5–15.5)
WBC: 7.9 10*3/uL (ref 4.0–10.5)
nRBC: 0 % (ref 0.0–0.2)

## 2020-06-22 LAB — GLUCOSE, CAPILLARY
Glucose-Capillary: 152 mg/dL — ABNORMAL HIGH (ref 70–99)
Glucose-Capillary: 167 mg/dL — ABNORMAL HIGH (ref 70–99)
Glucose-Capillary: 153 mg/dL — ABNORMAL HIGH (ref 70–99)
Glucose-Capillary: 139 mg/dL — ABNORMAL HIGH (ref 70–99)

## 2020-06-22 LAB — BRAIN NATRIURETIC PEPTIDE: B Natriuretic Peptide: 96.8 pg/mL (ref 0.0–100.0)

## 2020-06-22 LAB — PROCALCITONIN: Procalcitonin: <0.1 ng/mL

## 2020-06-22 LAB — D-DIMER, QUANTITATIVE: D-Dimer, Quant: 7.74 ug/mL-FEU — ABNORMAL HIGH (ref 0.00–0.50)

## 2020-06-22 LAB — C-REACTIVE PROTEIN: CRP: 4.5 mg/dL — ABNORMAL HIGH (ref <1.0)

## 2020-06-22 LAB — COMPREHENSIVE METABOLIC PANEL
ALT: 18 U/L (ref 0–44)
AST: 22 U/L (ref 15–41)
Albumin: 3 g/dL — ABNORMAL LOW (ref 3.5–5.0)
Alkaline Phosphatase: 46 U/L (ref 38–126)
Anion gap: 9 (ref 5–15)
BUN: 14 mg/dL (ref 6–20)
CO2: 26 mmol/L (ref 22–32)
Calcium: 8.9 mg/dL (ref 8.9–10.3)
Chloride: 106 mmol/L (ref 98–111)
Creatinine, Ser: 1.12 mg/dL (ref 0.61–1.24)
GFR calc Af Amer: >60 mL/min (ref >60)
GFR calc non Af Amer: >60 mL/min (ref >60)
Glucose, Bld: 174 mg/dL — ABNORMAL HIGH (ref 70–99)
Potassium: 4.2 mmol/L (ref 3.5–5.1)
Sodium: 141 mmol/L (ref 135–145)
Total Bilirubin: 0.9 mg/dL (ref 0.3–1.2)
Total Protein: 6 g/dL — ABNORMAL LOW (ref 6.5–8.1)

## 2020-06-22 LAB — MAGNESIUM: Magnesium: 2.1 mg/dL (ref 1.7–2.4)

## 2020-06-22 MED ORDER — ENSURE MAX PROTEIN PO LIQD
11.0000 [oz_av] | Freq: Two times a day (BID) | ORAL | Status: DC
  Administered 2020-06-22 – 2020-06-26 (×8): 11 [oz_av] via ORAL
  Filled 2020-06-22 (×10): qty 330

## 2020-06-22 NOTE — Plan of Care (Signed)

## 2020-06-22 NOTE — Progress Notes (Signed)
Initial Nutrition Assessment  DOCUMENTATION CODES:   Not applicable  INTERVENTION:  Provide Ensure Max po BID, each supplement provides 150 kcal and 30 grams of protein.   Recommend obtaining new weight to fully assess weight trends.   NUTRITION DIAGNOSIS:   Increased nutrient needs related to acute illness (COVID) as evidenced by estimated needs.  GOAL:   Patient will meet greater than or equal to 90% of their needs  MONITOR:   PO intake, Supplement acceptance, Skin, Weight trends, I & O's, Labs  REASON FOR ASSESSMENT:   Malnutrition Screening Tool    ASSESSMENT:   44 y.o. male with medical history significant of HTN, hypothyroidism, OSA on CPAP, presented with increasing short of breath fever diarrhea. Diagnosed with acute hypoxic respiratory failure due to COVID-19 pneumonia  Pt unavailable during attempted time of contact. Meal completion has been 100%. RD to order nutritional supplements to aid in increased nutrient needs. Noted no recent weight recorded. Recommend obtaining new weight to fully assess weight trends. Unable to complete Nutrition-Focused physical exam at this time. RD working remotely.  Labs and medications reviewed.   Diet Order:   Diet Order            Diet Heart Room service appropriate? Yes; Fluid consistency: Thin  Diet effective now                 EDUCATION NEEDS:   Not appropriate for education at this time  Skin:  Skin Assessment: Reviewed RN Assessment  Last BM:  Unknown  Height:   Ht Readings from Last 1 Encounters:  06/14/20 6' (1.829 m)    Weight:   Wt Readings from Last 1 Encounters:  06/14/20 (!) 156.5 kg   Estimated Nutritional Needs:   Kcal:  2200-2400  Protein:  120-140 grams  Fluid:  >/= 2 L/day  Roslyn Smiling, MS, RD, LDN RD pager number/after hours weekend pager number on Amion.

## 2020-06-22 NOTE — TOC Benefit Eligibility Note (Signed)
Transition of Care [facility]) Benefit Eligibility Note    Patient Details  Name: Rodney Mcclain MRN: [id number] Date of Birth: July 07, 1976   Medication/Dose: Eliquis 5mg .bid, 10mg . bid, Enoxparin daily  Covered?: Yes  Tier: 3 Drug (enoxparin tier2)  Prescription Coverage [facility]erred [facility]  Spoke with Person/Company/Phone Number:: [employer]  [facility]armacy @[phone]  Co-Pay: $100.00  each medication  Prior Approval: No  Deductible: Unmet       Cammie Sickle Phone Number: 06/22/2020, 10:36 AM

## 2020-06-22 NOTE — Plan of Care (Signed)
  Problem: Education: Goal: Knowledge of General Education information will improve Description: Including pain rating scale, medication(s)/side effects and non-pharmacologic comfort measures Outcome: Progressing   Problem: Activity: Goal: Risk for activity intolerance will decrease Outcome: Progressing   Problem: Nutrition: Goal: Adequate nutrition will be maintained Outcome: Progressing   

## 2020-06-22 NOTE — Progress Notes (Signed)
PROGRESS NOTE                                                                                                                                                                                                             Patient Demographics:    Rodney Mcclain, is a 44 y.o. male, DOB - Mar 02, 1976, ZOX:[id number]  Outpatient Primary MD for the patient is Sheliah Hatch, MD    LOS - 2  Admit date - 06/20/2020    Chief Complaint  Patient presents with   Covid Positive   Shortness of Breath       Brief Narrative - Rodney Mcclain is a 43 y.o. male with medical history significant of HTN, morbid obesity, hypothyroidism, OSA on CPAP, presented with increasing short of breath fever diarrhea.  Symptoms started about 3 weeks ago, he was diagnosed with acute hypoxic respiratory failure due to COVID-19 pneumonia and admitted to the hospital.   Subjective:    Patient in bed, appears comfortable, denies any headache, no fever, no chest pain or pressure, mild shortness of breath , no abdominal pain. No focal weakness.    Assessment  & Plan :     1. Acute Hypoxic Resp. Failure due to Acute Covid 19 Viral Pneumonitis during the ongoing 2020 Covid 19 Pandemic - he is unfortunately not vaccinated and has incurred severe parenchymal lung injury, he has been placed appropriately on combination of IV steroids, remdesivir and Baricitinib after appropriate consent.  Will monitor him closely.  Encouraged the patient to sit up in chair in the daytime use I-S and flutter valve for pulmonary toiletry and then prone in bed when at night.  Will advance activity and titrate down oxygen as possible.   SpO2: (!) 88 % O2 Flow Rate (L/min): 2 L/min  Recent Labs  Lab 06/18/20 1544 06/20/20 1431 06/20/20 2150 06/21/20 0500 06/21/20 0712 06/21/20 0731 06/22/20 0605  WBC 6.9 8.7  --  4.8  --   --  7.9  PLT 131* 131*  --  124*  --   --  163  CRP  --   8.4*  --  9.2*  --   --  4.5*  BNP  --   --   --   --   --  42.8 96.8  DDIMER  --  10.76*  --  10.89*  --   --  7.74*  PROCALCITON  --  <0.10  --   --  <0.10  --  <0.10  AST 27 28  --  24  --   --  22  ALT 17 15  --  15  --   --  18  ALKPHOS 62 50  --  45  --   --  46  BILITOT 0.8 0.7  --  0.4  --   --  0.9  ALBUMIN 3.7 3.2*  --  2.9*  --   --  3.0*  LATICACIDVEN  --  3.4* 1.9  --   --   --   --       2. Bilateral lower extremity DVT.  Lovenox and discharged on Eliquis, CTA to rule out PE once he is more stable.  3. Hypothyroidism.  On Synthroid.  4.  Dyslipidemia.  Placed on home dose statin.  5.  Hypertension.  Placed on Norvasc.  6.  Morbid obesity with BMI of over 40.  Follow with PCP, with sleep apnea, will start CPAP nightly.     Condition - Extremely Guarded  Family Communication  : Wife Cala BradfordKimberly [phone] on 06/21/2020, 06/22/20  Code Status :  Full  Consults  :  None  Procedures  :    Leg US - Bilat leg Acute DVT  CTA -    PUD Prophylaxis : PPI  Disposition Plan  :    Status is: Inpatient  Remains inpatient appropriate because:IV treatments appropriate due to intensity of illness or inability to take PO   Dispo: The patient is from: Home              Anticipated d/c is to: Home              Anticipated d/c date is: > 3 days              Patient currently is not medically stable to d/c.  DVT Prophylaxis  :  Lovenox    Lab Results  Component Value Date   PLT 163 06/22/2020    Diet :  Diet Order            Diet Heart Room service appropriate? Yes; Fluid consistency: Thin  Diet effective now                  Inpatient Medications  Scheduled Meds:  acidophilus  1 capsule Oral Daily   albuterol  2 puff Inhalation Q6H   baricitinib  4 mg Oral Daily   benzonatate  100 mg Oral Q8H   enoxaparin (LOVENOX) injection  150 mg Subcutaneous BID   insulin aspart  0-15 Units Subcutaneous TID WC   insulin aspart  0-5 Units Subcutaneous  QHS   lactobacillus acidophilus  2 tablet Oral TID   levothyroxine  112 mcg Oral QHS   methylPREDNISolone (SOLU-MEDROL) injection  80 mg Intravenous Q12H   pantoprazole  40 mg Oral Daily   simvastatin  40 mg Oral QHS   Continuous Infusions:  remdesivir 100 mg in NS 100 mL 100 mg (06/22/20 0824)   PRN Meds:.acetaminophen, guaiFENesin-dextromethorphan, loperamide, ondansetron  Antibiotics  :    Anti-infectives (From admission, onward)   Start     Dose/Rate Route Frequency Ordered Stop   06/21/20 1000  remdesivir 100 mg in sodium chloride 0.9 % 100 mL IVPB  Status:  Discontinued       "Followed by" Linked Group Details   100 mg 200 mL/hr over 30 Minutes Intravenous Daily  06/20/20 1547 06/20/20 1900   06/21/20 1000  remdesivir 100 mg in sodium chloride 0.9 % 100 mL IVPB       "Followed by" Linked Group Details   100 mg 200 mL/hr over 30 Minutes Intravenous Daily 06/20/20 1900 06/25/20 0959   06/20/20 1915  remdesivir 200 mg in sodium chloride 0.9% 250 mL IVPB       "Followed by" Linked Group Details   200 mg 580 mL/hr over 30 Minutes Intravenous Once 06/20/20 1900 06/20/20 2259   06/20/20 1600  remdesivir 200 mg in sodium chloride 0.9% 250 mL IVPB  Status:  Discontinued       "Followed by" Linked Group Details   200 mg 580 mL/hr over 30 Minutes Intravenous Once 06/20/20 1547 06/20/20 1900       Time Spent in minutes  30   Susa Raring M.D on 06/22/2020 at 10:21 AM  To page go to [email or web address] - password [facility]  [facility] -  Office  [phone]   See all Orders from today for further details    Objective:   Vitals:   06/21/20 2049 06/21/20 2049 06/21/20 2321 06/22/20 0404  BP: 133/75 133/75 131/79 (!) 136/91  Pulse: 72 63 72 68  Resp: Temp: 98.5 F (36.9 C) 98.5 F (36.9 C) 98.7 F (37.1 C) 98 F (36.7 C)  TempSrc: Oral Oral Oral Oral  SpO2: 90% 90% (!) 87% (!) 88%    Wt Readings from Last 3 Encounters:  06/14/20 (!) 156.5 kg    02/11/20 (!) 158.8 kg  01/26/20 (!) 161 kg     Intake/Output Summary (Last 24 hours) at 06/22/2020 1021 Last data filed at 06/22/2020 0854 Gross per 24 hour  Intake 790 ml  Output 1410 ml  Net -620 ml     Physical Exam  Awake Alert, No new F.N deficits, Normal affect [place].AT,PERRAL Supple Neck,No JVD, No cervical lymphadenopathy appriciated.  Symmetrical Chest wall movement, Good air movement bilaterally, CTAB RRR,No Gallops, Rubs or new Murmurs, No Parasternal Heave +ve B.Sounds, Abd Soft, No tenderness, No organomegaly appriciated, No rebound - guarding or rigidity. No Cyanosis, Clubbing or edema, No new Rash or bruise     Data Review:    CBC Recent Labs  Lab 06/18/20 1544 06/20/20 1431 06/21/20 0500 06/22/20 0605  WBC 6.9 8.7 4.8 7.9  HGB 15.3 13.6 13.5 13.2  HCT 45.6 41.6 39.2 39.8  PLT 131* 131* 124* 163  MCV 94.2 96.1 94.5 93.6  MCH 31.6 31.4 32.5 31.1  MCHC 33.6 32.7 34.4 33.2  RDW 13.0 13.2 13.1 12.8  LYMPHSABS  --  1.1 0.7 0.8  MONOABS  --  0.4 0.2 0.2  EOSABS  --  0.0 0.0 0.0  BASOSABS  --  0.0 0.0 0.0    Recent Labs  Lab 06/18/20 1544 06/20/20 1431 06/20/20 2150 06/21/20 0500 06/21/20 0712 06/21/20 0731 06/22/20 0605  NA 135 137  --  141  --   --  141  K 4.1 4.2  --  4.4  --   --  4.2  CL 101 102  --  106  --   --  106  CO2 24 23  --  26  --   --  26  GLUCOSE 108* 100*  --  144*  --   --  174*  BUN 12 11  --  12  --   --  14  CREATININE 1.27* 1.13  --  1.21  --   --  1.12  CALCIUM 8.7* 8.5*  --  8.5*  --   --  8.9  AST 27 28  --  24  --   --  22  ALT 17 15  --  15  --   --  18  ALKPHOS 62 50  --  45  --   --  46  BILITOT 0.8 0.7  --  0.4  --   --  0.9  ALBUMIN 3.7 3.2*  --  2.9*  --   --  3.0*  MG  --   --   --  2.1  --   --  2.1  CRP  --  8.4*  --  9.2*  --   --  4.5*  DDIMER  --  10.76*  --  10.89*  --   --  7.74*  PROCALCITON  --  <0.10  --   --  <0.10  --  <0.10  LATICACIDVEN  --  3.4* 1.9  --   --   --   --   HGBA1C  --   --    --   --   --  5.5  --   BNP  --   --   --   --   --  42.8 96.8    ------------------------------------------------------------------------------------------------------------------ Recent Labs    06/20/20 1431  TRIG 76    Lab Results  Component Value Date   HGBA1C 5.5 06/21/2020   ------------------------------------------------------------------------------------------------------------------ No results for input(s): TSH, T4TOTAL, T3FREE, THYROIDAB in the last 72 hours.  Invalid input(s): FREET3  Cardiac Enzymes No results for input(s): CKMB, TROPONINI, MYOGLOBIN in the last 168 hours.  Invalid input(s): CK ------------------------------------------------------------------------------------------------------------------    Component Value Date/Time   BNP 96.8 06/22/2020 0605    Micro Results Recent Results (from the past 240 hour(s))  SARS Coronavirus 2 by RT PCR (hospital order, performed in [facility] hospital lab) Nasopharyngeal Nasopharyngeal Swab     Status: Abnormal   Collection Time: 06/14/20 11:47 AM   Specimen: Nasopharyngeal Swab  Result Value Ref Range Status   SARS Coronavirus 2 POSITIVE (A) NEGATIVE Final    Comment: RESULT CALLED TO, READ BACK BY AND VERIFIED WITH: CALLED TO R.MARCUM RN AT 1252 BY SROY ON [id number] (NOTE) SARS-CoV-2 target nucleic acids are DETECTED  SARS-CoV-2 RNA is generally detectable in upper respiratory specimens  during the acute phase of infection.  Positive results are indicative  of the presence of the identified virus, but do not rule out bacterial infection or co-infection with other pathogens not detected by the test.  Clinical correlation with patient history and  other diagnostic information is necessary to determine patient infection status.  The expected result is negative.  Fact Sheet for Patients:   BoilerBrush.com.cy   Fact Sheet for Healthcare Providers:     https://pope.com/    This test is not yet approved or cleared by the [place] FDA and  has been authorized for detection and/or diagnosis of SARS-CoV-2 by FDA under an Emergency Use Authorization (EUA).  This EUA will remain in effect (me aning this test can be used) for the duration of  the COVID-19 declaration under Section 564(b)(1) of the Act, 21 U.S.C. section 360-bbb-3(b)(1), unless the authorization is terminated or revoked sooner.  Performed at [facility], [address] [place], [place] [address]   Blood Culture (routine x 2)     Status: None (Preliminary result)   Collection Time: 06/20/20  2:31 PM  Specimen: BLOOD RIGHT HAND  Result Value Ref Range Status   Specimen Description BLOOD RIGHT HAND  Final   Special Requests   Final    BOTTLES DRAWN AEROBIC AND ANAEROBIC Blood Culture results may not be optimal due to an inadequate volume of blood received in culture bottles   Culture   Final    NO GROWTH 2 DAYS Performed at [facility], [address], [place], [place] [address]    Report Status PENDING  Incomplete  Blood Culture (routine x 2)     Status: None (Preliminary result)   Collection Time: 06/21/20  8:05 AM   Specimen: BLOOD LEFT HAND  Result Value Ref Range Status   Specimen Description BLOOD LEFT HAND  Final   Special Requests   Final    BOTTLES DRAWN AEROBIC AND ANAEROBIC Blood Culture results may not be optimal due to an inadequate volume of blood received in culture bottles   Culture   Final    NO GROWTH < 24 HOURS Performed at [facility], [address] [place], [place] [address]    Report Status PENDING  Incomplete    Radiology Reports DG Chest Port 1 View  Result Date: 06/21/2020 CLINICAL DATA:  Pneumonia.  COVID-19 positive. EXAM: PORTABLE CHEST 1 VIEW COMPARISON:  06/20/2020 FINDINGS: Progression of left lower lobe infiltrate.  No effusion Mild right lower lobe infiltrate with mild  progression. Decreased lung volume. Negative for heart failure. IMPRESSION: Progression of left lower lobe infiltrate compatible with pneumonia. Mild right lower lobe infiltrate also with mild progression. Electronically Signed   By: Marlan Palau M.D.   On: 06/21/2020 08:38   DG Chest Portable 1 View  Result Date: 06/20/2020 CLINICAL DATA:  44 year old who tested positive for COVID-19 6 days ago and has had 2 weeks of shortness of breath. Patient presents with acute worsening of shortness of breath and hypoxemia (oxygen saturation 80% on room air this morning). EXAM: PORTABLE CHEST 1 VIEW COMPARISON:  06/14/2020. FINDINGS: Suboptimal inspiration. Cardiac silhouette normal in size, unchanged. Since the prior examination 6 days ago, the opacity in the lingula has worsened and is now confluent. The nodular opacity in the RIGHT perihilar region persists. Mild RIGHT basilar atelectasis related to the suboptimal inspiration. No new pulmonary parenchymal abnormalities otherwise. IMPRESSION: 1. Worsening lingular pneumonia since the prior examination 6 days ago as the opacities are now confluent. 2. Persistent nodular opacity at the RIGHT lung base. Non-emergent CT of the chest with contrast is recommended in further evaluation once the patient has been treated for the pneumonia to see if the nodular opacity persists. 3. Suboptimal inspiration accounts for RIGHT basilar atelectasis. Electronically Signed   By: Hulan Saas M.D.   On: 06/20/2020 12:04   DG Chest Portable 1 View  Result Date: 06/14/2020 CLINICAL DATA:  Cough shortness of breath. EXAM: PORTABLE CHEST 1 VIEW COMPARISON:  None. FINDINGS: Cardiomediastinal silhouette is normal. Mediastinal contours appear intact. There is no evidence of pleural effusion or pneumothorax. Nodular airspace opacity in the right infrahilar region. Streaky peripheral airspace opacity in the left lower lung field. Osseous structures are without acute abnormality. Soft  tissues are grossly normal. IMPRESSION: 1. Nodular airspace opacity in the right infrahilar region may represent focal airspace consolidation or potentially pulmonary nodule. 2. Streaky peripheral airspace opacity in the left lower lung field may represent atelectasis or subpleural airspace consolidation. 3. Follow-up with chest CT without contrast or PA and lateral radiograph of the chest may be  considered. Electronically Signed   By: Ted Mcalpine M.D.   On: 06/14/2020 11:27   VAS [place] LOWER EXTREMITY VENOUS (DVT)  Result Date: 06/21/2020  Lower Venous DVTStudy Indications: COVID-19 positive. Elevated d-dimer (10.89).  Comparison Study: No prior study Performing Technologist: Gertie Fey MHA, RDMS, RVT, RDCS  Examination Guidelines: A complete evaluation includes B-mode imaging, spectral Doppler, color Doppler, and power Doppler as needed of all accessible portions of each vessel. Bilateral testing is considered an integral part of a complete examination. Limited examinations for reoccurring indications may be performed as noted. The reflux portion of the exam is performed with the patient in reverse Trendelenburg.  +---------+---------------+---------+-----------+----------+--------------+  RIGHT     Compressibility Phasicity Spontaneity Properties Thrombus Aging  +---------+---------------+---------+-----------+----------+--------------+  CFV       Full            Yes       Yes                                    +---------+---------------+---------+-----------+----------+--------------+  SFJ       Full                                                             +---------+---------------+---------+-----------+----------+--------------+  FV Prox   None                      No                     Acute           +---------+---------------+---------+-----------+----------+--------------+  FV Mid    None                                             Acute            +---------+---------------+---------+-----------+----------+--------------+  FV Distal None                                             Acute           +---------+---------------+---------+-----------+----------+--------------+  PFV       None                                             Acute           +---------+---------------+---------+-----------+----------+--------------+  POP       Full            Yes       Yes                                    +---------+---------------+---------+-----------+----------+--------------+  PTV       Full  Yes                                    +---------+---------------+---------+-----------+----------+--------------+  PERO      None                      No                     Acute           +---------+---------------+---------+-----------+----------+--------------+   +---------+---------------+---------+-----------+----------+--------------+  LEFT      Compressibility Phasicity Spontaneity Properties Thrombus Aging  +---------+---------------+---------+-----------+----------+--------------+  CFV       Full            Yes       Yes                                    +---------+---------------+---------+-----------+----------+--------------+  SFJ       Full                                                             +---------+---------------+---------+-----------+----------+--------------+  FV Prox   Full                                                             +---------+---------------+---------+-----------+----------+--------------+  FV Mid    Full                                                             +---------+---------------+---------+-----------+----------+--------------+  FV Distal Full                                                             +---------+---------------+---------+-----------+----------+--------------+  PFV       Full                                                              +---------+---------------+---------+-----------+----------+--------------+  POP       Full            Yes       Yes                                    +---------+---------------+---------+-----------+----------+--------------+  PTV       Full  Yes                                    +---------+---------------+---------+-----------+----------+--------------+  PERO      None                      No                     Acute           +---------+---------------+---------+-----------+----------+--------------+     Summary: RIGHT: - Findings consistent with acute deep vein thrombosis involving the right femoral vein, right proximal profunda vein, and right peroneal veins. - No cystic structure found in the popliteal fossa.  LEFT: - Findings consistent with acute deep vein thrombosis involving the left peroneal veins. - No cystic structure found in the popliteal fossa.  *See table(s) above for measurements and observations.    Preliminary

## 2020-06-22 NOTE — TOC Initial Note (Addendum)
Transition of Care [facility]) - Initial/Assessment Note    Patient Details  Name: Rodney Mcclain MRN: [id number] Date of Birth: [birth year]  [facility] CM/SW Contact:    Lockie Pares, RN Phone Number: 06/22/2020, 9:26 AM  Clinical Narrative:                  Readmit from last week, now requiring oxygen. At 5L desaturates with activity.  Now has bilateral DVT wil need to be on longer term anticoagulation. Currently on full dose lovonox, will obtain eligibility for DOAC./ lovenox  CM will follow for needs.  Expected Discharge Plan: Home/Self Care Barriers to Discharge: Continued Medical Work up   Patient Goals and CMS Choice        Expected Discharge Plan and Services Expected Discharge Plan: Home/Self Care   Discharge Planning Services: CM Consult   Living arrangements for the past 2 months: Single Family Home                                      Prior Living Arrangements/Services Living arrangements for the past 2 months: Single Family Home Lives with:: Spouse Patient language and need for interpreter reviewed:: Yes        Need for Family Participation in Patient Care: Yes (Comment) Care giver support system in place?: Yes (comment)   Criminal Activity/Legal Involvement Pertinent to Current Situation/Hospitalization: No - Comment as needed  Activities of Daily Living Home Assistive Devices/Equipment: None ADL Screening (condition at time of admission) Patient's cognitive ability adequate to safely complete daily activities?: No Is the patient deaf or have difficulty hearing?: Yes Does the patient have difficulty seeing, even when wearing glasses/contacts?: No Does the patient have difficulty concentrating, remembering, or making decisions?: No Patient able to express need for assistance with ADLs?: No Does the patient have difficulty dressing or bathing?: No Independently performs ADLs?: Yes (appropriate for developmental age) Does the patient  have difficulty walking or climbing stairs?: No Weakness of Legs: None Weakness of Arms/Hands: None  Permission Sought/Granted                  Emotional Assessment       Orientation: : Oriented to Self, Oriented to Place, Oriented to  Time, Oriented to Situation Alcohol / Substance Use: Not Applicable Psych Involvement: No (comment)  Admission diagnosis:  PNA (pneumonia) [J18.9] Acute hypoxemic respiratory failure (HCC) [J96.01] COVID-19 [U07.1] Patient Active Problem List   Diagnosis Date Noted  . COVID-19 virus infection 06/20/2020  . COVID-19 06/20/2020  . Acute hypoxemic respiratory failure (HCC)   . Hypothyroid 10/31/2019  . Snoring 01/12/2018  . Dyslipidemia 12/31/2017  . Vertigo 03/29/2017  . Gout flare 01/19/2014  . General medical examination 03/03/2011  . Morbid obesity (HCC) 01/31/2011  . Anxiety and depression 01/31/2011  . ANAL FISSURE 06/17/2009  . ALLERGIC RHINITIS DUE TO POLLEN 01/14/2009  . Essential hypertension 12/14/2008  . BACK PAIN, THORACIC REGION 12/14/2008   PCP:  Sheliah Hatch, MD Pharmacy:   [facility] [address], [place] - [address] [facility] [place] [address] Phone: [phone] Fax: [phone]  [facility] [place], [place] - [address] [place], [address] [place], [address] [place] [address] Phone: [phone] Fax: [phone]     Social Determinants of Health (SDOH) Interventions    Readmission Risk Interventions No flowsheet data found.

## 2020-06-23 ENCOUNTER — Inpatient Hospital Stay (HOSPITAL_COMMUNITY): Payer: BC Managed Care – PPO

## 2020-06-23 LAB — COMPREHENSIVE METABOLIC PANEL
ALT: 23 U/L (ref 0–44)
AST: 24 U/L (ref 15–41)
Albumin: 3.2 g/dL — ABNORMAL LOW (ref 3.5–5.0)
Alkaline Phosphatase: 44 U/L (ref 38–126)
Anion gap: 10 (ref 5–15)
BUN: 21 mg/dL — ABNORMAL HIGH (ref 6–20)
CO2: 26 mmol/L (ref 22–32)
Calcium: 8.9 mg/dL (ref 8.9–10.3)
Chloride: 104 mmol/L (ref 98–111)
Creatinine, Ser: 1.1 mg/dL (ref 0.61–1.24)
GFR calc Af Amer: >60 mL/min (ref >60)
GFR calc non Af Amer: >60 mL/min (ref >60)
Glucose, Bld: 149 mg/dL — ABNORMAL HIGH (ref 70–99)
Potassium: 4.4 mmol/L (ref 3.5–5.1)
Sodium: 140 mmol/L (ref 135–145)
Total Bilirubin: 0.9 mg/dL (ref 0.3–1.2)
Total Protein: 6.4 g/dL — ABNORMAL LOW (ref 6.5–8.1)

## 2020-06-23 LAB — CBC WITH DIFFERENTIAL/PLATELET
Abs Immature Granulocytes: 0.12 10*3/uL — ABNORMAL HIGH (ref 0.00–0.07)
Basophils Absolute: 0 10*3/uL (ref 0.0–0.1)
Basophils Relative: 0 %
Eosinophils Absolute: 0 10*3/uL (ref 0.0–0.5)
Eosinophils Relative: 0 %
HCT: 40.5 % (ref 39.0–52.0)
Hemoglobin: 14 g/dL (ref 13.0–17.0)
Immature Granulocytes: 1 %
Lymphocytes Relative: 6 %
Lymphs Abs: 0.7 10*3/uL (ref 0.7–4.0)
MCH: 32.3 pg (ref 26.0–34.0)
MCHC: 34.6 g/dL (ref 30.0–36.0)
MCV: 93.3 fL (ref 80.0–100.0)
Monocytes Absolute: 0.4 10*3/uL (ref 0.1–1.0)
Monocytes Relative: 3 %
Neutro Abs: 10.5 10*3/uL — ABNORMAL HIGH (ref 1.7–7.7)
Neutrophils Relative %: 90 %
Platelets: 195 10*3/uL (ref 150–400)
RBC: 4.34 MIL/uL (ref 4.22–5.81)
RDW: 12.6 % (ref 11.5–15.5)
WBC: 11.7 10*3/uL — ABNORMAL HIGH (ref 4.0–10.5)
nRBC: 0 % (ref 0.0–0.2)

## 2020-06-23 LAB — GLUCOSE, CAPILLARY
Glucose-Capillary: 135 mg/dL — ABNORMAL HIGH (ref 70–99)
Glucose-Capillary: 160 mg/dL — ABNORMAL HIGH (ref 70–99)
Glucose-Capillary: 167 mg/dL — ABNORMAL HIGH (ref 70–99)
Glucose-Capillary: 159 mg/dL — ABNORMAL HIGH (ref 70–99)

## 2020-06-23 LAB — PROCALCITONIN: Procalcitonin: <0.1 ng/mL

## 2020-06-23 LAB — MAGNESIUM: Magnesium: 2.1 mg/dL (ref 1.7–2.4)

## 2020-06-23 LAB — C-REACTIVE PROTEIN: CRP: 1.8 mg/dL — ABNORMAL HIGH (ref <1.0)

## 2020-06-23 LAB — BRAIN NATRIURETIC PEPTIDE: B Natriuretic Peptide: 899.4 pg/mL — ABNORMAL HIGH (ref 0.0–100.0)

## 2020-06-23 LAB — D-DIMER, QUANTITATIVE: D-Dimer, Quant: 6.87 ug/mL-FEU — ABNORMAL HIGH (ref 0.00–0.50)

## 2020-06-23 MED ORDER — ENOXAPARIN SODIUM 150 MG/ML ~~LOC~~ SOLN
150.0000 mg | Freq: Two times a day (BID) | SUBCUTANEOUS | Status: DC
  Administered 2020-06-23 – 2020-06-25 (×5): 150 mg via SUBCUTANEOUS
  Filled 2020-06-23 (×7): qty 1

## 2020-06-23 MED ORDER — APIXABAN 5 MG PO TABS: 5.0000 mg | ORAL_TABLET | Freq: Two times a day (BID) | ORAL | Status: DC

## 2020-06-23 MED ORDER — IOHEXOL 350 MG/ML SOLN
80.0000 mL | Freq: Once | INTRAVENOUS | Status: AC | PRN
  Administered 2020-06-23: 80 mL via INTRAVENOUS

## 2020-06-23 MED ORDER — METHYLPREDNISOLONE SODIUM SUCC 125 MG IJ SOLR
60.0000 mg | Freq: Every day | INTRAMUSCULAR | Status: DC
  Administered 2020-06-24: 60 mg via INTRAVENOUS
  Filled 2020-06-23: qty 2

## 2020-06-23 MED ORDER — APIXABAN 5 MG PO TABS: 10.0000 mg | ORAL_TABLET | Freq: Two times a day (BID) | ORAL | Status: DC

## 2020-06-23 NOTE — Progress Notes (Signed)
PROGRESS NOTE                                                                                                                                                                                                             Patient Demographics:    Rodney Mcclain, is a 44 y.o. male, DOB - 1976/07/17, ZOX:[id number]  Outpatient Primary MD for the patient is Sheliah Hatch, MD    LOS - 3  Admit date - 06/20/2020    Chief Complaint  Patient presents with   Covid Positive   Shortness of Breath       Brief Narrative - Rodney Mcclain is a 44 y.o. male with medical history significant of HTN, morbid obesity, hypothyroidism, OSA on CPAP, presented with increasing short of breath fever diarrhea.  Symptoms started about 3 weeks ago, he was diagnosed with acute hypoxic respiratory failure due to COVID-19 pneumonia and admitted to the hospital.   Subjective:   Patient in bed, appears comfortable, denies any headache, no fever, no chest pain or pressure, improved shortness of breath , no abdominal pain. No focal weakness.   Assessment  & Plan :     1. Acute Hypoxic Resp. Failure due to Acute Covid 19 Viral Pneumonitis during the ongoing 2020 Covid 19 Pandemic - he is unfortunately not vaccinated and has incurred severe parenchymal lung injury, he has been placed appropriately on combination of IV steroids, remdesivir and Baricitinib after appropriate consent.  Rodney Mcclain improving with start tapering steroids.  Encouraged the patient to sit up in chair in the daytime use I-S and flutter valve for pulmonary toiletry and then prone in bed when at night.  Will advance activity and titrate down oxygen as possible.   SpO2: 95 % O2 Flow Rate (L/min): 2 L/min  Recent Labs  Lab 06/18/20 1544 06/20/20 1431 06/20/20 2150 06/21/20 0500 06/21/20 0712 06/21/20 0731 06/22/20 0605 06/23/20 0815  WBC 6.9 8.7  --  4.8  --   --  7.9 11.7*  PLT  131* 131*  --  124*  --   --  163 195  CRP  --  8.4*  --  9.2*  --   --  4.5* 1.8*  BNP  --   --   --   --   --  42.8 96.8 899.4*  DDIMER  --  10.76*  --  10.89*  --   --  7.74* 6.87*  PROCALCITON  --  <0.10  --   --  <0.10  --  <0.10  --   AST 27 28  --  24  --   --  22 24  ALT 17 15  --  15  --   --  18 23  ALKPHOS 62 50  --  45  --   --  46 44  BILITOT 0.8 0.7  --  0.4  --   --  0.9 0.9  ALBUMIN 3.7 3.2*  --  2.9*  --   --  3.0* 3.2*  LATICACIDVEN  --  3.4* 1.9  --   --   --   --   --       2. Bilateral lower extremity DVT.  Lovenox and discharged on Eliquis, CTA to rule out PE today.  3. Hypothyroidism.  On Synthroid.  4.  Dyslipidemia.  Placed on home dose statin.  5.  Hypertension.  Placed on Norvasc.  6.  Morbid obesity with BMI of over 40.  Follow with PCP, with sleep apnea, will start CPAP nightly.     Condition - Extremely Guarded  Family Communication  : [name] [phone] on 06/21/2020, 06/22/20  Code Status :  Full  Consults  :  None  Procedures  :    Leg [place] - Bilat leg Acute DVT  CTA -    PUD Prophylaxis : PPI  Disposition Plan  :    Status is: Inpatient  Remains inpatient appropriate because:IV treatments appropriate due to intensity of illness or inability to take PO   Dispo: The patient is from: Home              Anticipated d/c is to: Home              Anticipated d/c date is: > 3 days              Patient currently is not medically stable to d/c.  DVT Prophylaxis  :  Lovenox    Lab Results  Component Value Date   PLT 195 06/23/2020    Diet :  Diet Order            Diet Heart Room service appropriate? Yes; Fluid consistency: Thin  Diet effective now                  Inpatient Medications  Scheduled Meds:  acidophilus  1 capsule Oral Daily   albuterol  2 puff Inhalation Q6H   baricitinib  4 mg Oral Daily   benzonatate  100 mg Oral Q8H   enoxaparin (LOVENOX) injection  150 mg Subcutaneous BID   insulin aspart   0-15 Units Subcutaneous TID WC   insulin aspart  0-5 Units Subcutaneous QHS   lactobacillus acidophilus  2 tablet Oral TID   levothyroxine  112 mcg Oral QHS   methylPREDNISolone (SOLU-MEDROL) injection  80 mg Intravenous Q12H   pantoprazole  40 mg Oral Daily   Ensure Max Protein  11 oz Oral BID   simvastatin  40 mg Oral QHS   Continuous Infusions:  remdesivir 100 mg in NS 100 mL 100 mg (06/23/20 0818)   PRN Meds:.acetaminophen, guaiFENesin-dextromethorphan, loperamide, ondansetron  Antibiotics  :    Anti-infectives (From admission, onward)   Start     Dose/Rate Route Frequency Ordered Stop   06/21/20 1000  remdesivir 100 mg in sodium chloride 0.9 %  100 mL IVPB  Status:  Discontinued       "Followed by" Linked Group Details   100 mg 200 mL/hr over 30 Minutes Intravenous Daily 06/20/20 1547 06/20/20 1900   06/21/20 1000  remdesivir 100 mg in sodium chloride 0.9 % 100 mL IVPB       "Followed by" Linked Group Details   100 mg 200 mL/hr over 30 Minutes Intravenous Daily 06/20/20 1900 06/25/20 0959   06/20/20 1915  remdesivir 200 mg in sodium chloride 0.9% 250 mL IVPB       "Followed by" Linked Group Details   200 mg 580 mL/hr over 30 Minutes Intravenous Once 06/20/20 1900 06/20/20 2259   06/20/20 1600  remdesivir 200 mg in sodium chloride 0.9% 250 mL IVPB  Status:  Discontinued       "Followed by" Linked Group Details   200 mg 580 mL/hr over 30 Minutes Intravenous Once 06/20/20 1547 06/20/20 1900       Time Spent in minutes  30   Susa Raring M.D on 06/23/2020 at 11:14 AM  To page go to [email or web address] - password [facility]  [facility] -  Office  [phone]   See all Orders from today for further details    Objective:   Vitals:   06/22/20 2147 06/22/20 2232 06/23/20 0400 06/23/20 0459  BP:  (!) 143/96  107/68  Pulse:  72  60  Resp:  20  18  Temp:  98.9 F (37.2 C)  97.8 F (36.6 C)  TempSrc:  Oral  Oral  SpO2: 90%  95% 95%  Weight:  (!) 156.5 kg      Height:  6' (1.829 m)      Wt Readings from Last 3 Encounters:  06/22/20 (!) 156.5 kg  06/14/20 (!) 156.5 kg  02/11/20 (!) 158.8 kg     Intake/Output Summary (Last 24 hours) at 06/23/2020 1114 Last data filed at 06/23/2020 0500 Gross per 24 hour  Intake 886 ml  Output 900 ml  Net -14 ml     Physical Exam  Awake Alert, No new F.N deficits, Normal affect [place].AT,PERRAL Supple Neck,No JVD, No cervical lymphadenopathy appriciated.  Symmetrical Chest wall movement, Good air movement bilaterally, CTAB RRR,No Gallops, Rubs or new Murmurs, No Parasternal Heave +ve B.Sounds, Abd Soft, No tenderness, No organomegaly appriciated, No rebound - guarding or rigidity. No Cyanosis, Clubbing or edema, No new Rash or bruise    Data Review:    CBC Recent Labs  Lab 06/18/20 1544 06/20/20 1431 06/21/20 0500 06/22/20 0605 06/23/20 0815  WBC 6.9 8.7 4.8 7.9 11.7*  HGB 15.3 13.6 13.5 13.2 14.0  HCT 45.6 41.6 39.2 39.8 40.5  PLT 131* 131* 124* 163 195  MCV 94.2 96.1 94.5 93.6 93.3  MCH 31.6 31.4 32.5 31.1 32.3  MCHC 33.6 32.7 34.4 33.2 34.6  RDW 13.0 13.2 13.1 12.8 12.6  LYMPHSABS  --  1.1 0.7 0.8 0.7  MONOABS  --  0.4 0.2 0.2 0.4  EOSABS  --  0.0 0.0 0.0 0.0  BASOSABS  --  0.0 0.0 0.0 0.0    Recent Labs  Lab 06/18/20 1544 06/20/20 1431 06/20/20 2150 06/21/20 0500 06/21/20 0712 06/21/20 0731 06/22/20 0605 06/23/20 0815  NA 135 137  --  141  --   --  141 140  K 4.1 4.2  --  4.4  --   --  4.2 4.4  CL 101 102  --  106  --   --  106 104  CO2 24 23  --  26  --   --  26 26  GLUCOSE 108* 100*  --  144*  --   --  174* 149*  BUN 12 11  --  12  --   --  14 21*  CREATININE 1.27* 1.13  --  1.21  --   --  1.12 1.10  CALCIUM 8.7* 8.5*  --  8.5*  --   --  8.9 8.9  AST 27 28  --  24  --   --  22 24  ALT 17 15  --  15  --   --  18 23  ALKPHOS 62 50  --  45  --   --  46 44  BILITOT 0.8 0.7  --  0.4  --   --  0.9 0.9  ALBUMIN 3.7 3.2*  --  2.9*  --   --  3.0* 3.2*  MG  --   --   --   2.1  --   --  2.1 2.1  CRP  --  8.4*  --  9.2*  --   --  4.5* 1.8*  DDIMER  --  10.76*  --  10.89*  --   --  7.74* 6.87*  PROCALCITON  --  <0.10  --   --  <0.10  --  <0.10  --   LATICACIDVEN  --  3.4* 1.9  --   --   --   --   --   HGBA1C  --   --   --   --   --  5.5  --   --   BNP  --   --   --   --   --  42.8 96.8 899.4*    ------------------------------------------------------------------------------------------------------------------ Recent Labs    06/20/20 1431  TRIG 76    Lab Results  Component Value Date   HGBA1C 5.5 06/21/2020   ------------------------------------------------------------------------------------------------------------------ No results for input(s): TSH, T4TOTAL, T3FREE, THYROIDAB in the last 72 hours.  Invalid input(s): FREET3  Cardiac Enzymes No results for input(s): CKMB, TROPONINI, MYOGLOBIN in the last 168 hours.  Invalid input(s): CK ------------------------------------------------------------------------------------------------------------------    Component Value Date/Time   BNP 899.4 (H) 06/23/2020 0815    Micro Results Recent Results (from the past 240 hour(s))  SARS Coronavirus 2 by RT PCR (hospital order, performed in [facility] hospital lab) Nasopharyngeal Nasopharyngeal Swab     Status: Abnormal   Collection Time: 06/14/20 11:47 AM   Specimen: Nasopharyngeal Swab  Result Value Ref Range Status   SARS Coronavirus 2 POSITIVE (A) NEGATIVE Final    Comment: RESULT CALLED TO, READ BACK BY AND VERIFIED WITH: CALLED TO R.MARCUM RN AT 1252 BY SROY ON [id number] (NOTE) SARS-CoV-2 target nucleic acids are DETECTED  SARS-CoV-2 RNA is generally detectable in upper respiratory specimens  during the acute phase of infection.  Positive results are indicative  of the presence of the identified virus, but do not rule out bacterial infection or co-infection with other pathogens not detected by the test.  Clinical correlation with patient history  and  other diagnostic information is necessary to determine patient infection status.  The expected result is negative.  Fact Sheet for Patients:   BoilerBrush.com.cy   Fact Sheet for Healthcare Providers:   https://pope.com/    This test is not yet approved or cleared by the [place] FDA and  has been authorized for detection and/or diagnosis of SARS-CoV-2 by FDA under an Emergency Use Authorization (  EUA).  This EUA will remain in effect (me aning this test can be used) for the duration of  the COVID-19 declaration under Section 564(b)(1) of the Act, 21 U.S.C. section 360-bbb-3(b)(1), unless the authorization is terminated or revoked sooner.  Performed at [facility] [place], [address] [place], [place] [address]   Blood Culture (routine x 2)     Status: None (Preliminary result)   Collection Time: 06/20/20  2:31 PM   Specimen: BLOOD RIGHT HAND  Result Value Ref Range Status   Specimen Description BLOOD RIGHT HAND  Final   Special Requests   Final    BOTTLES DRAWN AEROBIC AND ANAEROBIC Blood Culture results may not be optimal due to an inadequate volume of blood received in culture bottles   Culture   Final    NO GROWTH 2 DAYS Performed at [facility], [address] [place], [place] [address]    Report Status PENDING  Incomplete  Blood Culture (routine x 2)     Status: None (Preliminary result)   Collection Time: 06/21/20  8:05 AM   Specimen: BLOOD LEFT HAND  Result Value Ref Range Status   Specimen Description BLOOD LEFT HAND  Final   Special Requests   Final    BOTTLES DRAWN AEROBIC AND ANAEROBIC Blood Culture results may not be optimal due to an inadequate volume of blood received in culture bottles   Culture   Final    NO GROWTH 1 DAY Performed at [facility], [address] [place], [place] [address]    Report Status PENDING  Incomplete    Radiology Reports DG Chest Port 1  View  Result Date: 06/21/2020 CLINICAL DATA:  Pneumonia.  COVID-19 positive. EXAM: PORTABLE CHEST 1 VIEW COMPARISON:  06/20/2020 FINDINGS: Progression of left lower lobe infiltrate.  No effusion Mild right lower lobe infiltrate with mild progression. Decreased lung volume. Negative for heart failure. IMPRESSION: Progression of left lower lobe infiltrate compatible with pneumonia. Mild right lower lobe infiltrate also with mild progression. Electronically Signed   By: Marlan Palauharles  Clark M.D.   On: 06/21/2020 08:38   DG Chest Portable 1 View  Result Date: 06/20/2020 CLINICAL DATA:  44 year old who tested positive for COVID-19 6 days ago and has had 2 weeks of shortness of breath. Patient presents with acute worsening of shortness of breath and hypoxemia (oxygen saturation 80% on room air this morning). EXAM: PORTABLE CHEST 1 VIEW COMPARISON:  06/14/2020. FINDINGS: Suboptimal inspiration. Cardiac silhouette normal in size, unchanged. Since the prior examination 6 days ago, the opacity in the lingula has worsened and is now confluent. The nodular opacity in the RIGHT perihilar region persists. Mild RIGHT basilar atelectasis related to the suboptimal inspiration. No new pulmonary parenchymal abnormalities otherwise. IMPRESSION: 1. Worsening lingular pneumonia since the prior examination 6 days ago as the opacities are now confluent. 2. Persistent nodular opacity at the RIGHT lung base. Non-emergent CT of the chest with contrast is recommended in further evaluation once the patient has been treated for the pneumonia to see if the nodular opacity persists. 3. Suboptimal inspiration accounts for RIGHT basilar atelectasis. Electronically Signed   By: Hulan Saashomas  Lawrence M.D.   On: 06/20/2020 12:04   DG Chest Portable 1 View  Result Date: 06/14/2020 CLINICAL DATA:  Cough shortness of breath. EXAM: PORTABLE CHEST 1 VIEW COMPARISON:  None. FINDINGS: Cardiomediastinal silhouette is normal. Mediastinal contours appear intact.  There is no evidence of pleural effusion or pneumothorax. Nodular airspace opacity  in the right infrahilar region. Streaky peripheral airspace opacity in the left lower lung field. Osseous structures are without acute abnormality. Soft tissues are grossly normal. IMPRESSION: 1. Nodular airspace opacity in the right infrahilar region may represent focal airspace consolidation or potentially pulmonary nodule. 2. Streaky peripheral airspace opacity in the left lower lung field may represent atelectasis or subpleural airspace consolidation. 3. Follow-up with chest CT without contrast or PA and lateral radiograph of the chest may be considered. Electronically Signed   By: Ted Mcalpine M.D.   On: 06/14/2020 11:27   VAS [place] LOWER EXTREMITY VENOUS (DVT)  Result Date: 06/22/2020  Lower Venous DVTStudy Indications: COVID-19 positive. Elevated d-dimer (10.89).  Comparison Study: No prior study Performing Technologist: Gertie Fey MHA, RDMS, RVT, RDCS  Examination Guidelines: A complete evaluation includes B-mode imaging, spectral Doppler, color Doppler, and power Doppler as needed of all accessible portions of each vessel. Bilateral testing is considered an integral part of a complete examination. Limited examinations for reoccurring indications may be performed as noted. The reflux portion of the exam is performed with the patient in reverse Trendelenburg.  +---------+---------------+---------+-----------+----------+--------------+  RIGHT     Compressibility Phasicity Spontaneity Properties Thrombus Aging  +---------+---------------+---------+-----------+----------+--------------+  CFV       Full            Yes       Yes                                    +---------+---------------+---------+-----------+----------+--------------+  SFJ       Full                                                             +---------+---------------+---------+-----------+----------+--------------+  FV Prox   None                       No                     Acute           +---------+---------------+---------+-----------+----------+--------------+  FV Mid    None                                             Acute           +---------+---------------+---------+-----------+----------+--------------+  FV Distal None                                             Acute           +---------+---------------+---------+-----------+----------+--------------+  PFV       None                                             Acute           +---------+---------------+---------+-----------+----------+--------------+  POP       Full  Yes       Yes                                    +---------+---------------+---------+-----------+----------+--------------+  PTV       Full                      Yes                                    +---------+---------------+---------+-----------+----------+--------------+  PERO      None                      No                     Acute           +---------+---------------+---------+-----------+----------+--------------+   +---------+---------------+---------+-----------+----------+--------------+  LEFT      Compressibility Phasicity Spontaneity Properties Thrombus Aging  +---------+---------------+---------+-----------+----------+--------------+  CFV       Full            Yes       Yes                                    +---------+---------------+---------+-----------+----------+--------------+  SFJ       Full                                                             +---------+---------------+---------+-----------+----------+--------------+  FV Prox   Full                                                             +---------+---------------+---------+-----------+----------+--------------+  FV Mid    Full                                                             +---------+---------------+---------+-----------+----------+--------------+  FV Distal Full                                                              +---------+---------------+---------+-----------+----------+--------------+  PFV       Full                                                             +---------+---------------+---------+-----------+----------+--------------+  POP  Full            Yes       Yes                                    +---------+---------------+---------+-----------+----------+--------------+  PTV       Full                      Yes                                    +---------+---------------+---------+-----------+----------+--------------+  PERO      None                      No                     Acute           +---------+---------------+---------+-----------+----------+--------------+     Summary: RIGHT: - Findings consistent with acute deep vein thrombosis involving the right femoral vein, right proximal profunda vein, and right peroneal veins. - No cystic structure found in the popliteal fossa.  LEFT: - Findings consistent with acute deep vein thrombosis involving the left peroneal veins. - No cystic structure found in the popliteal fossa.  *See table(s) above for measurements and observations. Electronically signed by Gretta Began MD on 06/22/2020 at 5:07:08 PM.    Final

## 2020-06-23 NOTE — Progress Notes (Addendum)
ANTICOAGULATION CONSULT NOTE - Initial Consult  Pharmacy Consult for Lovenox>apixaban Indication: DVT  No Known Allergies  Patient Measurements: Height: 6' (182.9 cm) Weight: (!) 156.5 kg (345 lb) IBW/kg (Calculated) : 77.6 Heparin Dosing Weight:   Vital Signs: Temp: 97.8 F (36.6 C) (09/15 0459) Temp Source: Oral (09/15 0459) BP: 107/68 (09/15 0459) Pulse Rate: 60 (09/15 0459)  Labs: Recent Labs    06/21/20 0500 06/21/20 0500 06/22/20 0605 06/23/20 0815  HGB 13.5   < > 13.2 14.0  HCT 39.2  --  39.8 40.5  PLT 124*  --  163 195  CREATININE 1.21  --  1.12 1.10   < > = values in this interval not displayed.    Estimated Creatinine Clearance: 129.6 mL/min (by C-G formula based on SCr of 1.1 mg/dL).   Medical History: Past Medical History:  Diagnosis Date  . Anal fissure   . Anxiety and depression   . Depression   . Diverticulitis   . Gout   . Hypertension   . Obesity   . OSA on CPAP     Medications:  Medications Prior to Admission  Medication Sig Dispense Refill Last Dose  . albuterol (VENTOLIN HFA) 108 (90 Base) MCG/ACT inhaler Inhale 2 puffs into the lungs every 6 (six) hours as needed for wheezing or shortness of breath. 8 g 01   . benzonatate (TESSALON) 100 MG capsule Take 1 capsule (100 mg total) by mouth every 8 (eight) hours. 21 capsule 0   . doxycycline (VIBRA-TABS) 100 MG tablet Take 1 tablet (100 mg total) by mouth 2 (two) times daily. 14 tablet 0   . EUTHYROX 112 MCG tablet Take 1 tablet by mouth once daily (Patient taking differently: Take 112 mcg by mouth at bedtime. ) 90 tablet 0   . ondansetron (ZOFRAN) 4 MG tablet Take 1 tablet (4 mg total) by mouth every 6 (six) hours as needed for nausea or vomiting. 20 tablet 0   . Probiotic Product (PROBIOTIC PO) Take 1 capsule by mouth daily.      . simvastatin (ZOCOR) 40 MG tablet TAKE 1 TABLET BY MOUTH AT BEDTIME (Patient taking differently: Take 40 mg by mouth at bedtime. ) 90 tablet 0   . valsartan  (DIOVAN) 80 MG tablet Take 1 tablet (80 mg total) by mouth daily. 90 tablet 1    Scheduled:  . acidophilus  1 capsule Oral Daily  . albuterol  2 puff Inhalation Q6H  . [START ON 06/24/2020] apixaban  10 mg Oral BID   Followed by  . [START ON 06/28/2020] apixaban  5 mg Oral BID  . baricitinib  4 mg Oral Daily  . benzonatate  100 mg Oral Q8H  . enoxaparin (LOVENOX) injection  150 mg Subcutaneous BID  . insulin aspart  0-15 Units Subcutaneous TID WC  . insulin aspart  0-5 Units Subcutaneous QHS  . lactobacillus acidophilus  2 tablet Oral TID  . levothyroxine  112 mcg Oral QHS  . [START ON 06/24/2020] methylPREDNISolone (SOLU-MEDROL) injection  60 mg Intravenous Daily  . pantoprazole  40 mg Oral Daily  . Ensure Max Protein  11 oz Oral BID  . simvastatin  40 mg Oral QHS   Infusions:  . remdesivir 100 mg in NS 100 mL 100 mg (06/23/20 0818)    Assessment: Pt has been on therapeutic lovenox for his DVT due to COVID. He has gotten 3 days of parenteral therapy. Plan is to transition to PO apixaban therapy. He will need 4  more days of apixaban load then maintenance can begin.  Addendum:  CT is back and positive for PE. Dr Thedore Mins would like the patient to be continued on Lovenox for a few more days before transitioning to apixaban. Pulm has been consulted.    Scr 1.1 CBC wnl  Goal of Therapy:  Monitor platelets by anticoagulation protocol: Yes   Plan:  Continue Lovenox 150mg  SQ BID F/u with transitioning to apixaban  , PharmD, BCIDP, AAHIVP, CPP Infectious Disease Pharmacist 06/23/2020 11:43 AM

## 2020-06-23 NOTE — Care Management (Signed)
Per notes, benefit check was completed on yesterday. Patient's copay will be $100, he has not met his deductible.Patient will receive 30 day free card.  TOC Team will continue to monitor. Ricki Miller, RN BSN Case Manager

## 2020-06-24 LAB — COMPREHENSIVE METABOLIC PANEL
ALT: 29 U/L (ref 0–44)
AST: 29 U/L (ref 15–41)
Albumin: 2.9 g/dL — ABNORMAL LOW (ref 3.5–5.0)
Alkaline Phosphatase: 39 U/L (ref 38–126)
Anion gap: 7 (ref 5–15)
BUN: 21 mg/dL — ABNORMAL HIGH (ref 6–20)
CO2: 28 mmol/L (ref 22–32)
Calcium: 8.9 mg/dL (ref 8.9–10.3)
Chloride: 107 mmol/L (ref 98–111)
Creatinine, Ser: 1.19 mg/dL (ref 0.61–1.24)
GFR calc Af Amer: >60 mL/min (ref >60)
GFR calc non Af Amer: >60 mL/min (ref >60)
Glucose, Bld: 122 mg/dL — ABNORMAL HIGH (ref 70–99)
Potassium: 3.9 mmol/L (ref 3.5–5.1)
Sodium: 142 mmol/L (ref 135–145)
Total Bilirubin: 0.7 mg/dL (ref 0.3–1.2)
Total Protein: 5.9 g/dL — ABNORMAL LOW (ref 6.5–8.1)

## 2020-06-24 LAB — CBC WITH DIFFERENTIAL/PLATELET
Abs Immature Granulocytes: 0.22 10*3/uL — ABNORMAL HIGH (ref 0.00–0.07)
Basophils Absolute: 0 10*3/uL (ref 0.0–0.1)
Basophils Relative: 0 %
Eosinophils Absolute: 0 10*3/uL (ref 0.0–0.5)
Eosinophils Relative: 0 %
HCT: 40.2 % (ref 39.0–52.0)
Hemoglobin: 13.9 g/dL (ref 13.0–17.0)
Immature Granulocytes: 2 %
Lymphocytes Relative: 9 %
Lymphs Abs: 1 10*3/uL (ref 0.7–4.0)
MCH: 32 pg (ref 26.0–34.0)
MCHC: 34.6 g/dL (ref 30.0–36.0)
MCV: 92.6 fL (ref 80.0–100.0)
Monocytes Absolute: 0.9 10*3/uL (ref 0.1–1.0)
Monocytes Relative: 8 %
Neutro Abs: 9 10*3/uL — ABNORMAL HIGH (ref 1.7–7.7)
Neutrophils Relative %: 81 %
Platelets: 212 10*3/uL (ref 150–400)
RBC: 4.34 MIL/uL (ref 4.22–5.81)
RDW: 12.6 % (ref 11.5–15.5)
WBC: 11.2 10*3/uL — ABNORMAL HIGH (ref 4.0–10.5)
nRBC: 0 % (ref 0.0–0.2)

## 2020-06-24 LAB — D-DIMER, QUANTITATIVE: D-Dimer, Quant: 7.57 ug/mL-FEU — ABNORMAL HIGH (ref 0.00–0.50)

## 2020-06-24 LAB — GLUCOSE, CAPILLARY
Glucose-Capillary: 90 mg/dL (ref 70–99)
Glucose-Capillary: 131 mg/dL — ABNORMAL HIGH (ref 70–99)
Glucose-Capillary: 150 mg/dL — ABNORMAL HIGH (ref 70–99)
Glucose-Capillary: 132 mg/dL — ABNORMAL HIGH (ref 70–99)

## 2020-06-24 LAB — C-REACTIVE PROTEIN: CRP: 1 mg/dL — ABNORMAL HIGH (ref <1.0)

## 2020-06-24 LAB — MAGNESIUM: Magnesium: 2.1 mg/dL (ref 1.7–2.4)

## 2020-06-24 LAB — BRAIN NATRIURETIC PEPTIDE: B Natriuretic Peptide: 89.3 pg/mL (ref 0.0–100.0)

## 2020-06-24 MED ORDER — METHYLPREDNISOLONE SODIUM SUCC 40 MG IJ SOLR
40.0000 mg | Freq: Every day | INTRAMUSCULAR | Status: DC
  Administered 2020-06-25: 40 mg via INTRAVENOUS
  Filled 2020-06-24: qty 1

## 2020-06-24 NOTE — Progress Notes (Signed)
PROGRESS NOTE                                                                                                                                                                                                             Patient Demographics:    Rodney Mcclain, is a 44 y.o. male, DOB - 06/28/1976, ZOX:[id number]  Outpatient Primary MD for the patient is Sheliah Hatch, MD    LOS - 4  Admit date - 06/20/2020    Chief Complaint  Patient presents with  . Covid Positive  . Shortness of Breath       Brief Narrative - Rodney Mcclain is a 44 y.o. male with medical history significant of HTN, morbid obesity, hypothyroidism, OSA on CPAP, presented with increasing short of breath fever diarrhea.  Symptoms started about 3 weeks ago, he was diagnosed with acute hypoxic respiratory failure due to COVID-19 pneumonia and admitted to the hospital.   Subjective:   Patient in bed, appears comfortable, denies any headache, no fever, no chest pain or pressure, improved shortness of breath , no abdominal pain. No focal weakness.   Assessment  & Plan :     1. Acute Hypoxic Resp. Failure due to Acute Covid 19 Viral Pneumonitis during the ongoing 2020 Covid 19 Pandemic - he is unfortunately not vaccinated and has incurred severe parenchymal lung injury, he has been placed appropriately on combination of IV steroids, remdesivir and Baricitinib after appropriate consent.  [name] improving with start tapering steroids.  Encouraged the patient to sit up in chair in the daytime use I-S and flutter valve for pulmonary toiletry and then prone in bed when at night.  Will advance activity and titrate down oxygen as possible.   SpO2: 96 % O2 Flow Rate (L/min): 2 L/min  Recent Labs  Lab 06/20/20 1431 06/20/20 2150 06/21/20 0500 06/21/20 0712 06/21/20 0731 06/22/20 0605 06/23/20 0815 06/24/20 0555  WBC 8.7  --  4.8  --   --  7.9 11.7* 11.2*  PLT  131*  --  124*  --   --  163 195 212  CRP 8.4*  --  9.2*  --   --  4.5* 1.8* 1.0*  BNP  --   --   --   --  42.8 96.8 899.4* 89.3  DDIMER 10.76*  --  10.89*  --   --  7.74* 6.87* 7.57*  PROCALCITON <0.10  --   --  <0.10  --  <0.10 <0.10  --   AST 28  --  24  --   --  22 24 29   ALT 15  --  15  --   --  18 23 29   ALKPHOS 50  --  45  --   --  46 44 39  BILITOT 0.7  --  0.4  --   --  0.9 0.9 0.7  ALBUMIN 3.2*  --  2.9*  --   --  3.0* 3.2* 2.9*  LATICACIDVEN 3.4* 1.9  --   --   --   --   --   --       2. Bilateral lower extremity DVT and partially occlusive PE.  Lovenox and discharge on Eliquis,  DW PCCM Agarwala no change in plans.  3. Hypothyroidism.  On Synthroid.  4.  Dyslipidemia.  Placed on home dose statin.  5.  Hypertension.  Placed on Norvasc.  6.  Morbid obesity with BMI of over 40.  Follow with PCP, with sleep apnea, will start CPAP nightly.     Condition - Extremely Guarded  Family Communication  : Wife Cala Bradford [phone] on 06/21/2020, 06/22/20, 06/24/20  Code Status :  Full  Consults  :  None  Procedures  :    Leg [place] - Bilat leg Acute DVT  CTA - Partially occlusive thrombus within the right main pulmonary artery and posterior right lower lobe segmental and subsegmental pulmonary arterial branches. No evidence of right ventricular heart strain. Multifocal patchy airspace opacities throughout both lungs, left greater than right, consistent with viral pneumonia.   PUD Prophylaxis : PPI  Disposition Plan  :    Status is: Inpatient  Remains inpatient appropriate because:IV treatments appropriate due to intensity of illness or inability to take PO   Dispo: The patient is from: Home              Anticipated d/c is to: Home              Anticipated d/c date is: > 3 days              Patient currently is not medically stable to d/c.  DVT Prophylaxis  :  Lovenox    Lab Results  Component Value Date   PLT 212 06/24/2020    Diet :  Diet Order             Diet Heart Room service appropriate? Yes; Fluid consistency: Thin  Diet effective now                  Inpatient Medications  Scheduled Meds: . acidophilus  1 capsule Oral Daily  . albuterol  2 puff Inhalation Q6H  . baricitinib  4 mg Oral Daily  . benzonatate  100 mg Oral Q8H  . enoxaparin (LOVENOX) injection  150 mg Subcutaneous BID  . insulin aspart  0-15 Units Subcutaneous TID WC  . insulin aspart  0-5 Units Subcutaneous QHS  . lactobacillus acidophilus  2 tablet Oral TID  . levothyroxine  112 mcg Oral QHS  . methylPREDNISolone (SOLU-MEDROL) injection  60 mg Intravenous Daily  . pantoprazole  40 mg Oral Daily  . Ensure Max Protein  11 oz Oral BID  . simvastatin  40 mg Oral QHS   Continuous Infusions:  PRN Meds:.acetaminophen, guaiFENesin-dextromethorphan, loperamide, ondansetron  Antibiotics  :    Anti-infectives (From admission, onward)  Start     Dose/Rate Route Frequency Ordered Stop   06/21/20 1000  remdesivir 100 mg in sodium chloride 0.9 % 100 mL IVPB  Status:  Discontinued       "Followed by" Linked Group Details   100 mg 200 mL/hr over 30 Minutes Intravenous Daily 06/20/20 1547 06/20/20 1900   06/21/20 1000  remdesivir 100 mg in sodium chloride 0.9 % 100 mL IVPB       "Followed by" Linked Group Details   100 mg 200 mL/hr over 30 Minutes Intravenous Daily 06/20/20 1900 06/24/20 0941   06/20/20 1915  remdesivir 200 mg in sodium chloride 0.9% 250 mL IVPB       "Followed by" Linked Group Details   200 mg 580 mL/hr over 30 Minutes Intravenous Once 06/20/20 1900 06/20/20 2259   06/20/20 1600  remdesivir 200 mg in sodium chloride 0.9% 250 mL IVPB  Status:  Discontinued       "Followed by" Linked Group Details   200 mg 580 mL/hr over 30 Minutes Intravenous Once 06/20/20 1547 06/20/20 1900       Time Spent in minutes  30   Susa Raring M.D on 06/24/2020 at 10:15 AM  To page go to [email or web address] - password [facility]  [facility] -  Office   [phone]   See all Orders from today for further details    Objective:   Vitals:   06/23/20 1549 06/23/20 1800 06/23/20 2232 06/24/20 0541  BP: 116/74  (!) 102/55 (!) 141/83  Pulse: 82 65 69 60  Resp:   19 19  Temp: 97.6 F (36.4 C)  97.7 F (36.5 C) (!) 97.5 F (36.4 C)  TempSrc: Oral  Oral Oral  SpO2: 97% 96% 93% 96%  Weight:      Height:        Wt Readings from Last 3 Encounters:  06/22/20 (!) 156.5 kg  06/14/20 (!) 156.5 kg  02/11/20 (!) 158.8 kg     Intake/Output Summary (Last 24 hours) at 06/24/2020 1015 Last data filed at 06/24/2020 0541 Gross per 24 hour  Intake 540 ml  Output 700 ml  Net -160 ml     Physical Exam  Awake Alert, No new F.N deficits, Normal affect [place].AT,PERRAL Supple Neck,No JVD, No cervical lymphadenopathy appriciated.  Symmetrical Chest wall movement, Good air movement bilaterally, CTAB RRR,No Gallops, Rubs or new Murmurs, No Parasternal Heave +ve B.Sounds, Abd Soft, No tenderness, No organomegaly appriciated, No rebound - guarding or rigidity. No Cyanosis, Clubbing or edema, No new Rash or bruise     Data Review:    CBC Recent Labs  Lab 06/20/20 1431 06/21/20 0500 06/22/20 0605 06/23/20 0815 06/24/20 0555  WBC 8.7 4.8 7.9 11.7* 11.2*  HGB 13.6 13.5 13.2 14.0 13.9  HCT 41.6 39.2 39.8 40.5 40.2  PLT 131* 124* 163 195 212  MCV 96.1 94.5 93.6 93.3 92.6  MCH 31.4 32.5 31.1 32.3 32.0  MCHC 32.7 34.4 33.2 34.6 34.6  RDW 13.2 13.1 12.8 12.6 12.6  LYMPHSABS 1.1 0.7 0.8 0.7 1.0  MONOABS 0.4 0.2 0.2 0.4 0.9  EOSABS 0.0 0.0 0.0 0.0 0.0  BASOSABS 0.0 0.0 0.0 0.0 0.0    Recent Labs  Lab 06/20/20 1431 06/20/20 2150 06/21/20 0500 06/21/20 0712 06/21/20 0731 06/22/20 0605 06/23/20 0815 06/24/20 0555  NA 137  --  141  --   --  141 140 142  K 4.2  --  4.4  --   --  4.2 4.4 3.9  CL 102  --  106  --   --  106 104 107  CO2 23  --  26  --   --  GLUCOSE 100*  --  144*  --   --  174* 149* 122*  BUN 11  --  12  --    --  14 21* 21*  CREATININE 1.13  --  1.21  --   --  1.12 1.10 1.19  CALCIUM 8.5*  --  8.5*  --   --  8.9 8.9 8.9  AST 28  --  24  --   --  ALT 15  --  15  --   --  ALKPHOS 50  --  45  --   --  46 44 39  BILITOT 0.7  --  0.4  --   --  0.9 0.9 0.7  ALBUMIN 3.2*  --  2.9*  --   --  3.0* 3.2* 2.9*  MG  --   --  2.1  --   --  2.1 2.1 2.1  CRP 8.4*  --  9.2*  --   --  4.5* 1.8* 1.0*  DDIMER 10.76*  --  10.89*  --   --  7.74* 6.87* 7.57*  PROCALCITON <0.10  --   --  <0.10  --  <0.10 <0.10  --   LATICACIDVEN 3.4* 1.9  --   --   --   --   --   --   HGBA1C  --   --   --   --  5.5  --   --   --   BNP  --   --   --   --  42.8 96.8 899.4* 89.3    ------------------------------------------------------------------------------------------------------------------ No results for input(s): CHOL, HDL, LDLCALC, TRIG, CHOLHDL, LDLDIRECT in the last 72 hours.  Lab Results  Component Value Date   HGBA1C 5.5 06/21/2020   ------------------------------------------------------------------------------------------------------------------ No results for input(s): TSH, T4TOTAL, T3FREE, THYROIDAB in the last 72 hours.  Invalid input(s): FREET3  Cardiac Enzymes No results for input(s): CKMB, TROPONINI, MYOGLOBIN in the last 168 hours.  Invalid input(s): CK ------------------------------------------------------------------------------------------------------------------    Component Value Date/Time   BNP 89.3 06/24/2020 0555    Micro Results Recent Results (from the past 240 hour(s))  SARS Coronavirus 2 by RT PCR (hospital order, performed in [facility] hospital lab) Nasopharyngeal Nasopharyngeal Swab     Status: Abnormal   Collection Time: 06/14/20 11:47 AM   Specimen: Nasopharyngeal Swab  Result Value Ref Range Status   SARS Coronavirus 2 POSITIVE (A) NEGATIVE Final    Comment: RESULT CALLED TO, READ BACK BY AND VERIFIED WITH: CALLED TO R.MARCUM RN AT 1252 BY SROY ON  [id number] (NOTE) SARS-CoV-2 target nucleic acids are DETECTED  SARS-CoV-2 RNA is generally detectable in upper respiratory specimens  during the acute phase of infection.  Positive results are indicative  of the presence of the identified virus, but do not rule out bacterial infection or co-infection with other pathogens not detected by the test.  Clinical correlation with patient history and  other diagnostic information is necessary to determine patient infection status.  The expected result is negative.  Fact Sheet for Patients:   BoilerBrush.com.cy   Fact Sheet for Healthcare Providers:   https://pope.com/    This test is not yet approved or cleared by the [place] FDA and  has been authorized for detection and/or diagnosis of  SARS-CoV-2 by FDA under an Emergency Use Authorization (EUA).  This EUA will remain in effect (me aning this test can be used) for the duration of  the COVID-19 declaration under Section 564(b)(1) of the Act, 21 U.S.C. section 360-bbb-3(b)(1), unless the authorization is terminated or revoked sooner.  Performed at [facility], [address] [place], [place] [address]   Blood Culture (routine x 2)     Status: None (Preliminary result)   Collection Time: 06/20/20  2:31 PM   Specimen: BLOOD RIGHT HAND  Result Value Ref Range Status   Specimen Description BLOOD RIGHT HAND  Final   Special Requests   Final    BOTTLES DRAWN AEROBIC AND ANAEROBIC Blood Culture results may not be optimal due to an inadequate volume of blood received in culture bottles   Culture   Final    NO GROWTH 3 DAYS Performed at [facility], [address] [place], [place] [address]    Report Status PENDING  Incomplete  Blood Culture (routine x 2)     Status: None (Preliminary result)   Collection Time: 06/21/20  8:05 AM   Specimen: BLOOD LEFT HAND  Result Value Ref Range Status   Specimen Description BLOOD  LEFT HAND  Final   Special Requests   Final    BOTTLES DRAWN AEROBIC AND ANAEROBIC Blood Culture results may not be optimal due to an inadequate volume of blood received in culture bottles   Culture   Final    NO GROWTH 2 DAYS Performed at [facility], [address] [place], [place] [address]    Report Status PENDING  Incomplete    Radiology Reports CT ANGIO CHEST PE W OR WO CONTRAST  Result Date: 06/23/2020 CLINICAL DATA:  Shortness of breath COVID positive EXAM: CT ANGIOGRAPHY CHEST WITH CONTRAST TECHNIQUE: Multidetector CT imaging of the chest was performed using the standard protocol during bolus administration of intravenous contrast. Multiplanar CT image reconstructions and MIPs were obtained to evaluate the vascular anatomy. CONTRAST:  80mL OMNIPAQUE IOHEXOL 350 MG/ML SOLN COMPARISON:  June 21, 2020 FINDINGS: Cardiovascular: There is slightly suboptimal opacification of the main pulmonary artery. A partially occlusive thrombus is seen within the right main pulmonary artery extending into the right posterior lower lobe segmental and subsegmental pulmonary arterial branches. The heart is normal in size. No pericardial effusion or thickening. No evidence right heart strain. There is normal three-vessel brachiocephalic anatomy without proximal stenosis. The thoracic aorta is normal in appearance. Mediastinum/Nodes: No hilar, mediastinal, or axillary adenopathy. Thyroid gland, trachea, and esophagus demonstrate no significant findings. Lungs/Pleura: Patchy airspace opacity is seen throughout the left lung most notable within the left lower lobe with bronchial wall thickening and air bronchograms. There is also patchy airspace opacity seen at the posterior right lower lung. No pleural effusion is seen. Upper Abdomen: No acute abnormalities present in the visualized portions of the upper abdomen. Musculoskeletal: No chest wall abnormality. No acute or significant osseous findings. Review  of the MIP images confirms the above findings. IMPRESSION: Partially occlusive thrombus within the right main pulmonary artery and posterior right lower lobe segmental and subsegmental pulmonary arterial branches. No evidence of right ventricular heart strain. Multifocal patchy airspace opacities throughout both lungs, left greater than right, consistent with viral pneumonia. These results will be called to the ordering clinician or representative by the Radiologist Assistant, and communication documented in the PACS or [employer] Dashboard. Electronically Signed   By: Heywood BeneBindu  Avutu M.D.  On: 06/23/2020 15:57   DG Chest Port 1 View  Result Date: 06/21/2020 CLINICAL DATA:  Pneumonia.  COVID-19 positive. EXAM: PORTABLE CHEST 1 VIEW COMPARISON:  06/20/2020 FINDINGS: Progression of left lower lobe infiltrate.  No effusion Mild right lower lobe infiltrate with mild progression. Decreased lung volume. Negative for heart failure. IMPRESSION: Progression of left lower lobe infiltrate compatible with pneumonia. Mild right lower lobe infiltrate also with mild progression. Electronically Signed   By: Marlan Palau M.D.   On: 06/21/2020 08:38   DG Chest Portable 1 View  Result Date: 06/20/2020 CLINICAL DATA:  44 year old who tested positive for COVID-19 6 days ago and has had 2 weeks of shortness of breath. Patient presents with acute worsening of shortness of breath and hypoxemia (oxygen saturation 80% on room air this morning). EXAM: PORTABLE CHEST 1 VIEW COMPARISON:  06/14/2020. FINDINGS: Suboptimal inspiration. Cardiac silhouette normal in size, unchanged. Since the prior examination 6 days ago, the opacity in the lingula has worsened and is now confluent. The nodular opacity in the RIGHT perihilar region persists. Mild RIGHT basilar atelectasis related to the suboptimal inspiration. No new pulmonary parenchymal abnormalities otherwise. IMPRESSION: 1. Worsening lingular pneumonia since the prior examination 6 days ago  as the opacities are now confluent. 2. Persistent nodular opacity at the RIGHT lung base. Non-emergent CT of the chest with contrast is recommended in further evaluation once the patient has been treated for the pneumonia to see if the nodular opacity persists. 3. Suboptimal inspiration accounts for RIGHT basilar atelectasis. Electronically Signed   By: Hulan Saas M.D.   On: 06/20/2020 12:04   DG Chest Portable 1 View  Result Date: 06/14/2020 CLINICAL DATA:  Cough shortness of breath. EXAM: PORTABLE CHEST 1 VIEW COMPARISON:  None. FINDINGS: Cardiomediastinal silhouette is normal. Mediastinal contours appear intact. There is no evidence of pleural effusion or pneumothorax. Nodular airspace opacity in the right infrahilar region. Streaky peripheral airspace opacity in the left lower lung field. Osseous structures are without acute abnormality. Soft tissues are grossly normal. IMPRESSION: 1. Nodular airspace opacity in the right infrahilar region may represent focal airspace consolidation or potentially pulmonary nodule. 2. Streaky peripheral airspace opacity in the left lower lung field may represent atelectasis or subpleural airspace consolidation. 3. Follow-up with chest CT without contrast or PA and lateral radiograph of the chest may be considered. Electronically Signed   By: Ted Mcalpine M.D.   On: 06/14/2020 11:27   VAS [place] LOWER EXTREMITY VENOUS (DVT)  Result Date: 06/22/2020  Lower Venous DVTStudy Indications: COVID-19 positive. Elevated d-dimer (10.89).  Comparison Study: No prior study Performing Technologist: Gertie Fey MHA, RDMS, RVT, RDCS  Examination Guidelines: A complete evaluation includes B-mode imaging, spectral Doppler, color Doppler, and power Doppler as needed of all accessible portions of each vessel. Bilateral testing is considered an integral part of a complete examination. Limited examinations for reoccurring indications may be performed as noted. The reflux portion  of the exam is performed with the patient in reverse Trendelenburg.  +---------+---------------+---------+-----------+----------+--------------+ RIGHT    CompressibilityPhasicitySpontaneityPropertiesThrombus Aging +---------+---------------+---------+-----------+----------+--------------+ CFV      Full           Yes      Yes                                 +---------+---------------+---------+-----------+----------+--------------+ SFJ      Full                                                        +---------+---------------+---------+-----------+----------+--------------+  FV Prox  None                    No                   Acute          +---------+---------------+---------+-----------+----------+--------------+ FV Mid   None                                         Acute          +---------+---------------+---------+-----------+----------+--------------+ FV DistalNone                                         Acute          +---------+---------------+---------+-----------+----------+--------------+ PFV      None                                         Acute          +---------+---------------+---------+-----------+----------+--------------+ POP      Full           Yes      Yes                                 +---------+---------------+---------+-----------+----------+--------------+ PTV      Full                    Yes                                 +---------+---------------+---------+-----------+----------+--------------+ PERO     None                    No                   Acute          +---------+---------------+---------+-----------+----------+--------------+   +---------+---------------+---------+-----------+----------+--------------+ LEFT     CompressibilityPhasicitySpontaneityPropertiesThrombus Aging +---------+---------------+---------+-----------+----------+--------------+ CFV      Full           Yes      Yes                                  +---------+---------------+---------+-----------+----------+--------------+ SFJ      Full                                                        +---------+---------------+---------+-----------+----------+--------------+ FV Prox  Full                                                        +---------+---------------+---------+-----------+----------+--------------+ FV Mid   Full                                                        +---------+---------------+---------+-----------+----------+--------------+  FV DistalFull                                                        +---------+---------------+---------+-----------+----------+--------------+ PFV      Full                                                        +---------+---------------+---------+-----------+----------+--------------+ POP      Full           Yes      Yes                                 +---------+---------------+---------+-----------+----------+--------------+ PTV      Full                    Yes                                 +---------+---------------+---------+-----------+----------+--------------+ PERO     None                    No                   Acute          +---------+---------------+---------+-----------+----------+--------------+     Summary: RIGHT: - Findings consistent with acute deep vein thrombosis involving the right femoral vein, right proximal profunda vein, and right peroneal veins. - No cystic structure found in the popliteal fossa.  LEFT: - Findings consistent with acute deep vein thrombosis involving the left peroneal veins. - No cystic structure found in the popliteal fossa.  *See table(s) above for measurements and observations. Electronically signed by Gretta Began MD on 06/22/2020 at 5:07:08 PM.    Final

## 2020-06-24 NOTE — Plan of Care (Signed)
  Problem: Clinical Measurements: Goal: Ability to maintain clinical measurements within normal limits will improve Outcome: Progressing Goal: Respiratory complications will improve Outcome: Progressing   Problem: Activity: Goal: Risk for activity intolerance will decrease Outcome: Progressing   

## 2020-06-24 NOTE — Progress Notes (Signed)
Pt states he does no use cpap at night.

## 2020-06-24 NOTE — Progress Notes (Signed)
CSW provided 30 day free and $10/month copay card for Eliquis to Nursing Secretary who placed them in his dc packet. RN notified.   Blia Totman LCSW

## 2020-06-25 LAB — D-DIMER, QUANTITATIVE: D-Dimer, Quant: 3.34 ug/mL-FEU — ABNORMAL HIGH (ref 0.00–0.50)

## 2020-06-25 LAB — COMPREHENSIVE METABOLIC PANEL
ALT: 33 U/L (ref 0–44)
AST: 30 U/L (ref 15–41)
Albumin: 2.9 g/dL — ABNORMAL LOW (ref 3.5–5.0)
Alkaline Phosphatase: 39 U/L (ref 38–126)
Anion gap: 7 (ref 5–15)
BUN: 20 mg/dL (ref 6–20)
CO2: 27 mmol/L (ref 22–32)
Calcium: 8.4 mg/dL — ABNORMAL LOW (ref 8.9–10.3)
Chloride: 105 mmol/L (ref 98–111)
Creatinine, Ser: 1.21 mg/dL (ref 0.61–1.24)
GFR calc Af Amer: >60 mL/min (ref >60)
GFR calc non Af Amer: >60 mL/min (ref >60)
Glucose, Bld: 109 mg/dL — ABNORMAL HIGH (ref 70–99)
Potassium: 4.2 mmol/L (ref 3.5–5.1)
Sodium: 139 mmol/L (ref 135–145)
Total Bilirubin: 0.7 mg/dL (ref 0.3–1.2)
Total Protein: 5.6 g/dL — ABNORMAL LOW (ref 6.5–8.1)

## 2020-06-25 LAB — CBC WITH DIFFERENTIAL/PLATELET
Abs Immature Granulocytes: 0.35 10*3/uL — ABNORMAL HIGH (ref 0.00–0.07)
Basophils Absolute: 0 10*3/uL (ref 0.0–0.1)
Basophils Relative: 0 %
Eosinophils Absolute: 0 10*3/uL (ref 0.0–0.5)
Eosinophils Relative: 0 %
HCT: 37.9 % — ABNORMAL LOW (ref 39.0–52.0)
Hemoglobin: 13.1 g/dL (ref 13.0–17.0)
Immature Granulocytes: 4 %
Lymphocytes Relative: 12 %
Lymphs Abs: 1.2 10*3/uL (ref 0.7–4.0)
MCH: 32.1 pg (ref 26.0–34.0)
MCHC: 34.6 g/dL (ref 30.0–36.0)
MCV: 92.9 fL (ref 80.0–100.0)
Monocytes Absolute: 0.8 10*3/uL (ref 0.1–1.0)
Monocytes Relative: 9 %
Neutro Abs: 7 10*3/uL (ref 1.7–7.7)
Neutrophils Relative %: 75 %
Platelets: 196 10*3/uL (ref 150–400)
RBC: 4.08 MIL/uL — ABNORMAL LOW (ref 4.22–5.81)
RDW: 12.7 % (ref 11.5–15.5)
WBC: 9.4 10*3/uL (ref 4.0–10.5)
nRBC: 0.2 % (ref 0.0–0.2)

## 2020-06-25 LAB — CULTURE, BLOOD (ROUTINE X 2): Culture: NO GROWTH

## 2020-06-25 LAB — MAGNESIUM: Magnesium: 2.2 mg/dL (ref 1.7–2.4)

## 2020-06-25 LAB — BRAIN NATRIURETIC PEPTIDE: B Natriuretic Peptide: 40.3 pg/mL (ref 0.0–100.0)

## 2020-06-25 LAB — C-REACTIVE PROTEIN: CRP: 0.7 mg/dL (ref <1.0)

## 2020-06-25 LAB — GLUCOSE, CAPILLARY
Glucose-Capillary: 91 mg/dL (ref 70–99)
Glucose-Capillary: 123 mg/dL — ABNORMAL HIGH (ref 70–99)
Glucose-Capillary: 128 mg/dL — ABNORMAL HIGH (ref 70–99)
Glucose-Capillary: 167 mg/dL — ABNORMAL HIGH (ref 70–99)

## 2020-06-25 MED ORDER — METHYLPREDNISOLONE SODIUM SUCC 40 MG IJ SOLR
20.0000 mg | Freq: Every day | INTRAMUSCULAR | Status: DC
  Administered 2020-06-26: 20 mg via INTRAVENOUS
  Filled 2020-06-25: qty 1

## 2020-06-25 NOTE — Progress Notes (Signed)
Patient arrived to unit a/o/v. V/S stable. Patient oriented to room. No distress noted.

## 2020-06-25 NOTE — TOC Progression Note (Signed)
Transition of Care [facility]) - Progression Note    Patient Details  Name: Rodney Mcclain MRN: [id number] Date of Birth: September 07, 1976  [facility] CM/SW Contact  Lockie Pares, RN Phone Number: 06/25/2020, 11:52 AM  Clinical Narrative:   Oxygen set up with RO-tech at 2L. Attempted to call patient, no answer.    Expected Discharge Plan: Home/Self Care Barriers to Discharge: Continued Medical Work up  Expected Discharge Plan and Services Expected Discharge Plan: Home/Self Care   Discharge Planning Services: CM Consult   Living arrangements for the past 2 months: Single Family Home                                       Social Determinants of Health (SDOH) Interventions    Readmission Risk Interventions No flowsheet data found.

## 2020-06-25 NOTE — Progress Notes (Signed)
   06/25/20 2040  Assess: MEWS Score  Level of Consciousness Alert  Assess: MEWS Score  MEWS Temp 0  MEWS Systolic 0  MEWS Pulse 0  MEWS RR 2  MEWS LOC 0  MEWS Score 2  MEWS Score Color Yellow  Assess: if the MEWS score is Yellow or Red  Were vital signs taken at a resting state? Yes  Focused Assessment No change from prior assessment  Early Detection of Sepsis Score *See Row Information* Low  MEWS guidelines implemented *See Row Information* No, vital signs rechecked (Pt occasionally tachypneic, will recheck RR)

## 2020-06-25 NOTE — Progress Notes (Signed)
PROGRESS NOTE                                                                                                                                                                                                             Patient Demographics:    Rodney Mcclain, is a 44 y.o. male, DOB - July 16, 1976, ZOX:[id number]  Outpatient Primary MD for the patient is Sheliah Hatch, MD    LOS - 5  Admit date - 06/20/2020    Chief Complaint  Patient presents with  . Covid Positive  . Shortness of Breath       Brief Narrative - Rodney Mcclain is a 44 y.o. male with medical history significant of HTN, morbid obesity, hypothyroidism, OSA on CPAP, presented with increasing short of breath fever diarrhea.  Symptoms started about 3 weeks ago, he was diagnosed with acute hypoxic respiratory failure due to COVID-19 pneumonia and admitted to the hospital.   Subjective:   Patient in bed, appears comfortable, denies any headache, no fever, no chest pain or pressure, no shortness of breath , no abdominal pain. No focal weakness.   Assessment  & Plan :     1. Acute Hypoxic Resp. Failure due to Acute Covid 19 Viral Pneumonitis during the ongoing 2020 Covid 19 Pandemic - he is unfortunately not vaccinated and has incurred severe parenchymal lung injury, he has been placed appropriately on combination of IV steroids, remdesivir and Baricitinib after appropriate consent.  Clinically much improved and steroids being tapered.  DME home oxygen ordered in case he qualifies.  Encouraged the patient to sit up in chair in the daytime use I-S and flutter valve for pulmonary toiletry and then prone in bed when at night.  Will advance activity and titrate down oxygen as possible.   SpO2: 98 % O2 Flow Rate (L/min): 2 L/min  Recent Labs  Lab 06/20/20 1431 06/20/20 1431 06/20/20 2150 06/21/20 0500 06/21/20 0712 06/21/20 0731 06/22/20 0605 06/23/20 0815  06/24/20 0555 06/25/20 0500  WBC 8.7   < >  --  4.8  --   --  7.9 11.7* 11.2* 9.4  PLT 131*   < >  --  124*  --   --  163 195 212 196  CRP 8.4*   < >  --  9.2*  --   --  4.5* 1.8*  1.0* 0.7  BNP  --   --   --   --   --  42.8 96.8 899.4* 89.3 40.3  DDIMER 10.76*   < >  --  10.89*  --   --  7.74* 6.87* 7.57* 3.34*  PROCALCITON <0.10  --   --   --  <0.10  --  <0.10 <0.10  --   --   AST 28   < >  --  24  --   --  22 24 29 30   ALT 15   < >  --  15  --   --  18 23 29  33  ALKPHOS 50   < >  --  45  --   --  46 44 39 39  BILITOT 0.7   < >  --  0.4  --   --  0.9 0.9 0.7 0.7  ALBUMIN 3.2*   < >  --  2.9*  --   --  3.0* 3.2* 2.9* 2.9*  LATICACIDVEN 3.4*  --  1.9  --   --   --   --   --   --   --    < > = values in this interval not displayed.      2. Bilateral lower extremity DVT and partially occlusive PE.  Lovenox and discharge on Eliquis,  DW PCCM Agarwala no change in plans.  3. Hypothyroidism.  On Synthroid.  4.  Dyslipidemia.  Placed on home dose statin.  5.  Hypertension.  Placed on Norvasc.  6.  Morbid obesity with BMI of over 40.  Follow with PCP, with sleep apnea, will start CPAP nightly.     Condition - Extremely Guarded  Family Communication  : Wife Cala BradfordKimberly [phone] on 06/21/2020, 06/22/20, 06/24/20  Code Status :  Full  Consults  :  None  Procedures  :    Leg US - Bilat leg Acute DVT  CTA - Partially occlusive thrombus within the right main pulmonary artery and posterior right lower lobe segmental and subsegmental pulmonary arterial branches. No evidence of right ventricular heart strain. Multifocal patchy airspace opacities throughout both lungs, left greater than right, consistent with viral pneumonia.   PUD Prophylaxis : PPI  Disposition Plan  :    Status is: Inpatient  Remains inpatient appropriate because:IV treatments appropriate due to intensity of illness or inability to take PO   Dispo: The patient is from: Home              Anticipated d/c is to:  Home              Anticipated d/c date is: > 3 days              Patient currently is not medically stable to d/c.  DVT Prophylaxis  :  Lovenox    Lab Results  Component Value Date   PLT 196 06/25/2020    Diet :  Diet Order            Diet Heart Room service appropriate? Yes; Fluid consistency: Thin  Diet effective now                  Inpatient Medications  Scheduled Meds: . acidophilus  1 capsule Oral Daily  . albuterol  2 puff Inhalation Q6H  . baricitinib  4 mg Oral Daily  . benzonatate  100 mg Oral Q8H  . enoxaparin (LOVENOX) injection  150 mg Subcutaneous BID  . insulin aspart  0-15 Units Subcutaneous TID WC  . insulin aspart  0-5 Units Subcutaneous QHS  . lactobacillus acidophilus  2 tablet Oral TID  . levothyroxine  112 mcg Oral QHS  . methylPREDNISolone (SOLU-MEDROL) injection  40 mg Intravenous Daily  . pantoprazole  40 mg Oral Daily  . Ensure Max Protein  11 oz Oral BID  . simvastatin  40 mg Oral QHS   Continuous Infusions:  PRN Meds:.acetaminophen, guaiFENesin-dextromethorphan, loperamide, ondansetron  Antibiotics  :    Anti-infectives (From admission, onward)   Start     Dose/Rate Route Frequency Ordered Stop   06/21/20 1000  remdesivir 100 mg in sodium chloride 0.9 % 100 mL IVPB  Status:  Discontinued       "Followed by" Linked Group Details   100 mg 200 mL/hr over 30 Minutes Intravenous Daily 06/20/20 1547 06/20/20 1900   06/21/20 1000  remdesivir 100 mg in sodium chloride 0.9 % 100 mL IVPB       "Followed by" Linked Group Details   100 mg 200 mL/hr over 30 Minutes Intravenous Daily 06/20/20 1900 06/24/20 1000   06/20/20 1915  remdesivir 200 mg in sodium chloride 0.9% 250 mL IVPB       "Followed by" Linked Group Details   200 mg 580 mL/hr over 30 Minutes Intravenous Once 06/20/20 1900 06/20/20 2259   06/20/20 1600  remdesivir 200 mg in sodium chloride 0.9% 250 mL IVPB  Status:  Discontinued       "Followed by" Linked Group Details   200  mg 580 mL/hr over 30 Minutes Intravenous Once 06/20/20 1547 06/20/20 1900       Time Spent in minutes  30   Susa Raring M.D on 06/25/2020 at 11:21 AM  To page go to [email or web address] - password [email or web address]  [facility] -  Office  [phone]   See all Orders from today for further details    Objective:   Vitals:   06/24/20 1600 06/24/20 2100 06/25/20 0500 06/25/20 0900  BP:  114/73 110/77   Pulse: 61 67 (!) 55 76  Resp:  18 16   Temp:  (!) 97.5 F (36.4 C) 98.5 F (36.9 C)   TempSrc:  Oral Oral   SpO2: 95% 95% 93% 98%  Weight:      Height:        Wt Readings from Last 3 Encounters:  06/22/20 (!) 156.5 kg  06/14/20 (!) 156.5 kg  02/11/20 (!) 158.8 kg     Intake/Output Summary (Last 24 hours) at 06/25/2020 1121 Last data filed at 06/25/2020 0900 Gross per 24 hour  Intake 600 ml  Output --  Net 600 ml     Physical Exam  Awake Alert, No new F.N deficits, Normal affect [place].AT,PERRAL Supple Neck,No JVD, No cervical lymphadenopathy appriciated.  Symmetrical Chest wall movement, Good air movement bilaterally, CTAB RRR,No Gallops, Rubs or new Murmurs, No Parasternal Heave +ve B.Sounds, Abd Soft, No tenderness, No organomegaly appriciated, No rebound - guarding or rigidity. No Cyanosis, Clubbing or edema, No new Rash or bruise    Data Review:    CBC Recent Labs  Lab 06/21/20 0500 06/22/20 0605 06/23/20 0815 06/24/20 0555 06/25/20 0500  WBC 4.8 7.9 11.7* 11.2* 9.4  HGB 13.5 13.2 14.0 13.9 13.1  HCT 39.2 39.8 40.5 40.2 37.9*  PLT 124* 163 195 212 196  MCV 94.5 93.6 93.3 92.6 92.9  MCH 32.5 31.1 32.3 32.0 32.1  MCHC 34.4 33.2 34.6 34.6 34.6  RDW 13.1 12.8 12.6 12.6 12.7  LYMPHSABS 0.7 0.8 0.7 1.0 1.2  MONOABS 0.2 0.2 0.4 0.9 0.8  EOSABS 0.0 0.0 0.0 0.0 0.0  BASOSABS 0.0 0.0 0.0 0.0 0.0    Recent Labs  Lab 06/20/20 1431 06/20/20 1431 06/20/20 2150 06/21/20 0500 06/21/20 0712 06/21/20 0731 06/22/20 0605 06/23/20 0815 06/24/20 0555  06/25/20 0500  NA 137   < >  --  141  --   --  141 140 142 139  K 4.2   < >  --  4.4  --   --  4.2 4.4 3.9 4.2  CL 102   < >  --  106  --   --  106 104 107 105  CO2 23   < >  --  26  --   --  GLUCOSE 100*   < >  --  144*  --   --  174* 149* 122* 109*  BUN 11   < >  --  12  --   --  14 21* 21* 20  CREATININE 1.13   < >  --  1.21  --   --  1.12 1.10 1.19 1.21  CALCIUM 8.5*   < >  --  8.5*  --   --  8.9 8.9 8.9 8.4*  AST 28   < >  --  24  --   --  ALT 15   < >  --  15  --   --  33  ALKPHOS 50   < >  --  45  --   --  46 44 39 39  BILITOT 0.7   < >  --  0.4  --   --  0.9 0.9 0.7 0.7  ALBUMIN 3.2*   < >  --  2.9*  --   --  3.0* 3.2* 2.9* 2.9*  MG  --   --   --  2.1  --   --  2.1 2.1 2.1 2.2  CRP 8.4*   < >  --  9.2*  --   --  4.5* 1.8* 1.0* 0.7  DDIMER 10.76*   < >  --  10.89*  --   --  7.74* 6.87* 7.57* 3.34*  PROCALCITON <0.10  --   --   --  <0.10  --  <0.10 <0.10  --   --   LATICACIDVEN 3.4*  --  1.9  --   --   --   --   --   --   --   HGBA1C  --   --   --   --   --  5.5  --   --   --   --   BNP  --   --   --   --   --  42.8 96.8 899.4* 89.3 40.3   < > = values in this interval not displayed.    ------------------------------------------------------------------------------------------------------------------ No results for input(s): CHOL, HDL, LDLCALC, TRIG, CHOLHDL, LDLDIRECT in the last 72 hours.  Lab Results  Component Value Date   HGBA1C 5.5 06/21/2020   ------------------------------------------------------------------------------------------------------------------ No results for input(s): TSH, T4TOTAL, T3FREE, THYROIDAB in the last 72 hours.  Invalid input(s): FREET3  Cardiac Enzymes No results for input(s): CKMB, TROPONINI, MYOGLOBIN in the last 168 hours.  Invalid input(s): CK ------------------------------------------------------------------------------------------------------------------    Component Value Date/Time   BNP 40.3  06/25/2020 0500    Micro Results Recent Results (from the  past 240 hour(s))  Blood Culture (routine x 2)     Status: None   Collection Time: 06/20/20  2:31 PM   Specimen: BLOOD RIGHT HAND  Result Value Ref Range Status   Specimen Description BLOOD RIGHT HAND  Final   Special Requests   Final    BOTTLES DRAWN AEROBIC AND ANAEROBIC Blood Culture results may not be optimal due to an inadequate volume of blood received in culture bottles   Culture   Final    NO GROWTH 5 DAYS Performed at [facility], [address], [place], [place] [address]    Report Status 06/25/2020 FINAL  Final  Blood Culture (routine x 2)     Status: None (Preliminary result)   Collection Time: 06/21/20  8:05 AM   Specimen: BLOOD LEFT HAND  Result Value Ref Range Status   Specimen Description BLOOD LEFT HAND  Final   Special Requests   Final    BOTTLES DRAWN AEROBIC AND ANAEROBIC Blood Culture results may not be optimal due to an inadequate volume of blood received in culture bottles   Culture   Final    NO GROWTH 4 DAYS Performed at [facility], [address] [place], [place] [address]    Report Status PENDING  Incomplete    Radiology Reports CT ANGIO CHEST PE W OR WO CONTRAST  Result Date: 06/23/2020 CLINICAL DATA:  Shortness of breath COVID positive EXAM: CT ANGIOGRAPHY CHEST WITH CONTRAST TECHNIQUE: Multidetector CT imaging of the chest was performed using the standard protocol during bolus administration of intravenous contrast. Multiplanar CT image reconstructions and MIPs were obtained to evaluate the vascular anatomy. CONTRAST:  80mL OMNIPAQUE IOHEXOL 350 MG/ML SOLN COMPARISON:  June 21, 2020 FINDINGS: Cardiovascular: There is slightly suboptimal opacification of the main pulmonary artery. A partially occlusive thrombus is seen within the right main pulmonary artery extending into the right posterior lower lobe segmental and subsegmental pulmonary arterial branches. The heart is normal  in size. No pericardial effusion or thickening. No evidence right heart strain. There is normal three-vessel brachiocephalic anatomy without proximal stenosis. The thoracic aorta is normal in appearance. Mediastinum/Nodes: No hilar, mediastinal, or axillary adenopathy. Thyroid gland, trachea, and esophagus demonstrate no significant findings. Lungs/Pleura: Patchy airspace opacity is seen throughout the left lung most notable within the left lower lobe with bronchial wall thickening and air bronchograms. There is also patchy airspace opacity seen at the posterior right lower lung. No pleural effusion is seen. Upper Abdomen: No acute abnormalities present in the visualized portions of the upper abdomen. Musculoskeletal: No chest wall abnormality. No acute or significant osseous findings. Review of the MIP images confirms the above findings. IMPRESSION: Partially occlusive thrombus within the right main pulmonary artery and posterior right lower lobe segmental and subsegmental pulmonary arterial branches. No evidence of right ventricular heart strain. Multifocal patchy airspace opacities throughout both lungs, left greater than right, consistent with viral pneumonia. These results will be called to the ordering clinician or representative by the Radiologist Assistant, and communication documented in the PACS or [employer]. Electronically Signed   By: Jonna Clark M.D.   On: 06/23/2020 15:57   DG Chest Port 1 View  Result Date: 06/21/2020 CLINICAL DATA:  Pneumonia.  COVID-19 positive. EXAM: PORTABLE CHEST 1 VIEW COMPARISON:  06/20/2020 FINDINGS: Progression of left lower lobe infiltrate.  No effusion Mild right lower lobe infiltrate with mild progression. Decreased lung volume. Negative for heart failure. IMPRESSION: Progression of left lower lobe infiltrate compatible  with pneumonia. Mild right lower lobe infiltrate also with mild progression. Electronically Signed   By: Marlan Palau M.D.   On: 06/21/2020  08:38   DG Chest Portable 1 View  Result Date: 06/20/2020 CLINICAL DATA:  44 year old who tested positive for COVID-19 6 days ago and has had 2 weeks of shortness of breath. Patient presents with acute worsening of shortness of breath and hypoxemia (oxygen saturation 80% on room air this morning). EXAM: PORTABLE CHEST 1 VIEW COMPARISON:  06/14/2020. FINDINGS: Suboptimal inspiration. Cardiac silhouette normal in size, unchanged. Since the prior examination 6 days ago, the opacity in the lingula has worsened and is now confluent. The nodular opacity in the RIGHT perihilar region persists. Mild RIGHT basilar atelectasis related to the suboptimal inspiration. No new pulmonary parenchymal abnormalities otherwise. IMPRESSION: 1. Worsening lingular pneumonia since the prior examination 6 days ago as the opacities are now confluent. 2. Persistent nodular opacity at the RIGHT lung base. Non-emergent CT of the chest with contrast is recommended in further evaluation once the patient has been treated for the pneumonia to see if the nodular opacity persists. 3. Suboptimal inspiration accounts for RIGHT basilar atelectasis. Electronically Signed   By: Hulan Saas M.D.   On: 06/20/2020 12:04   DG Chest Portable 1 View  Result Date: 06/14/2020 CLINICAL DATA:  Cough shortness of breath. EXAM: PORTABLE CHEST 1 VIEW COMPARISON:  None. FINDINGS: Cardiomediastinal silhouette is normal. Mediastinal contours appear intact. There is no evidence of pleural effusion or pneumothorax. Nodular airspace opacity in the right infrahilar region. Streaky peripheral airspace opacity in the left lower lung field. Osseous structures are without acute abnormality. Soft tissues are grossly normal. IMPRESSION: 1. Nodular airspace opacity in the right infrahilar region may represent focal airspace consolidation or potentially pulmonary nodule. 2. Streaky peripheral airspace opacity in the left lower lung field may represent atelectasis or  subpleural airspace consolidation. 3. Follow-up with chest CT without contrast or PA and lateral radiograph of the chest may be considered. Electronically Signed   By: Ted Mcalpine M.D.   On: 06/14/2020 11:27   VAS [place] LOWER EXTREMITY VENOUS (DVT)  Result Date: 06/22/2020  Lower Venous DVTStudy Indications: COVID-19 positive. Elevated d-dimer (10.89).  Comparison Study: No prior study Performing Technologist: Gertie Fey MHA, RDMS, RVT, RDCS  Examination Guidelines: A complete evaluation includes B-mode imaging, spectral Doppler, color Doppler, and power Doppler as needed of all accessible portions of each vessel. Bilateral testing is considered an integral part of a complete examination. Limited examinations for reoccurring indications may be performed as noted. The reflux portion of the exam is performed with the patient in reverse Trendelenburg.  +---------+---------------+---------+-----------+----------+--------------+ RIGHT    CompressibilityPhasicitySpontaneityPropertiesThrombus Aging +---------+---------------+---------+-----------+----------+--------------+ CFV      Full           Yes      Yes                                 +---------+---------------+---------+-----------+----------+--------------+ SFJ      Full                                                        +---------+---------------+---------+-----------+----------+--------------+ FV Prox  None  No                   Acute          +---------+---------------+---------+-----------+----------+--------------+ FV Mid   None                                         Acute          +---------+---------------+---------+-----------+----------+--------------+ FV DistalNone                                         Acute          +---------+---------------+---------+-----------+----------+--------------+ PFV      None                                         Acute           +---------+---------------+---------+-----------+----------+--------------+ POP      Full           Yes      Yes                                 +---------+---------------+---------+-----------+----------+--------------+ PTV      Full                    Yes                                 +---------+---------------+---------+-----------+----------+--------------+ PERO     None                    No                   Acute          +---------+---------------+---------+-----------+----------+--------------+   +---------+---------------+---------+-----------+----------+--------------+ LEFT     CompressibilityPhasicitySpontaneityPropertiesThrombus Aging +---------+---------------+---------+-----------+----------+--------------+ CFV      Full           Yes      Yes                                 +---------+---------------+---------+-----------+----------+--------------+ SFJ      Full                                                        +---------+---------------+---------+-----------+----------+--------------+ FV Prox  Full                                                        +---------+---------------+---------+-----------+----------+--------------+ FV Mid   Full                                                        +---------+---------------+---------+-----------+----------+--------------+  FV DistalFull                                                        +---------+---------------+---------+-----------+----------+--------------+ PFV      Full                                                        +---------+---------------+---------+-----------+----------+--------------+ POP      Full           Yes      Yes                                 +---------+---------------+---------+-----------+----------+--------------+ PTV      Full                    Yes                                  +---------+---------------+---------+-----------+----------+--------------+ PERO     None                    No                   Acute          +---------+---------------+---------+-----------+----------+--------------+     Summary: RIGHT: - Findings consistent with acute deep vein thrombosis involving the right femoral vein, right proximal profunda vein, and right peroneal veins. - No cystic structure found in the popliteal fossa.  LEFT: - Findings consistent with acute deep vein thrombosis involving the left peroneal veins. - No cystic structure found in the popliteal fossa.  *See table(s) above for measurements and observations. Electronically signed by Gretta Began MD on 06/22/2020 at 5:07:08 PM.    Final

## 2020-06-25 NOTE — Progress Notes (Signed)
   06/25/20 2000  Assess: MEWS Score  Pulse Rate 63  ECG Heart Rate 64  Resp (!) 26  SpO2 99 %  Assess: MEWS Score  MEWS Temp 0  MEWS Systolic 0  MEWS Pulse 0  MEWS RR 2  MEWS LOC 0  MEWS Score 2  MEWS Score Color Yellow  Assess: if the MEWS score is Yellow or Red  Were vital signs taken at a resting state? Yes  Focused Assessment No change from prior assessment  Early Detection of Sepsis Score *See Row Information* Low  MEWS guidelines implemented *See Row Information* No, vital signs rechecked (Pt occasionally tachypneic, will recheck RR)

## 2020-06-25 NOTE — Progress Notes (Signed)
°   06/25/20 2100  Assess: MEWS Score  Pulse Rate 64  ECG Heart Rate 66  Resp (!) 26  SpO2 98 %  Assess: MEWS Score  MEWS Temp 0  MEWS Systolic 0  MEWS Pulse 0  MEWS RR 2  MEWS LOC 0  MEWS Score 2  MEWS Score Color Yellow  Assess: if the MEWS score is Yellow or Red  Were vital signs taken at a resting state? Yes  Focused Assessment No change from prior assessment  Early Detection of Sepsis Score *See Row Information* Low  MEWS guidelines implemented *See Row Information* No, vital signs rechecked (Pt occasionally tachypneic, will recheck RR)

## 2020-06-26 LAB — CBC WITH DIFFERENTIAL/PLATELET
Abs Immature Granulocytes: 0.48 10*3/uL — ABNORMAL HIGH (ref 0.00–0.07)
Basophils Absolute: 0.1 10*3/uL (ref 0.0–0.1)
Basophils Relative: 1 %
Eosinophils Absolute: 0 10*3/uL (ref 0.0–0.5)
Eosinophils Relative: 0 %
HCT: 40.4 % (ref 39.0–52.0)
Hemoglobin: 13.8 g/dL (ref 13.0–17.0)
Immature Granulocytes: 5 %
Lymphocytes Relative: 11 %
Lymphs Abs: 1.1 10*3/uL (ref 0.7–4.0)
MCH: 32.1 pg (ref 26.0–34.0)
MCHC: 34.2 g/dL (ref 30.0–36.0)
MCV: 94 fL (ref 80.0–100.0)
Monocytes Absolute: 0.8 10*3/uL (ref 0.1–1.0)
Monocytes Relative: 8 %
Neutro Abs: 8 10*3/uL — ABNORMAL HIGH (ref 1.7–7.7)
Neutrophils Relative %: 75 %
Platelets: 208 10*3/uL (ref 150–400)
RBC: 4.3 MIL/uL (ref 4.22–5.81)
RDW: 12.8 % (ref 11.5–15.5)
WBC: 10.4 10*3/uL (ref 4.0–10.5)
nRBC: 0 % (ref 0.0–0.2)

## 2020-06-26 LAB — CULTURE, BLOOD (ROUTINE X 2): Culture: NO GROWTH

## 2020-06-26 LAB — C-REACTIVE PROTEIN: CRP: 0.7 mg/dL (ref <1.0)

## 2020-06-26 LAB — D-DIMER, QUANTITATIVE: D-Dimer, Quant: 3.44 ug/mL-FEU — ABNORMAL HIGH (ref 0.00–0.50)

## 2020-06-26 LAB — COMPREHENSIVE METABOLIC PANEL
ALT: 35 U/L (ref 0–44)
AST: 26 U/L (ref 15–41)
Albumin: 3 g/dL — ABNORMAL LOW (ref 3.5–5.0)
Alkaline Phosphatase: 41 U/L (ref 38–126)
Anion gap: 8 (ref 5–15)
BUN: 27 mg/dL — ABNORMAL HIGH (ref 6–20)
CO2: 28 mmol/L (ref 22–32)
Calcium: 9 mg/dL (ref 8.9–10.3)
Chloride: 102 mmol/L (ref 98–111)
Creatinine, Ser: 1.28 mg/dL — ABNORMAL HIGH (ref 0.61–1.24)
GFR calc Af Amer: >60 mL/min (ref >60)
GFR calc non Af Amer: >60 mL/min (ref >60)
Glucose, Bld: 146 mg/dL — ABNORMAL HIGH (ref 70–99)
Potassium: 4.8 mmol/L (ref 3.5–5.1)
Sodium: 138 mmol/L (ref 135–145)
Total Bilirubin: 0.8 mg/dL (ref 0.3–1.2)
Total Protein: 5.8 g/dL — ABNORMAL LOW (ref 6.5–8.1)

## 2020-06-26 LAB — GLUCOSE, CAPILLARY: Glucose-Capillary: 104 mg/dL — ABNORMAL HIGH (ref 70–99)

## 2020-06-26 MED ORDER — APIXABAN 5 MG PO TABS: 5.0000 mg | ORAL_TABLET | Freq: Two times a day (BID) | ORAL | Status: DC

## 2020-06-26 MED ORDER — APIXABAN 5 MG PO TABS
10.0000 mg | ORAL_TABLET | Freq: Two times a day (BID) | ORAL | 0 refills | Status: DC
Start: 2020-06-26 — End: 2020-06-29

## 2020-06-26 MED ORDER — PROAIR RESPICLICK 108 (90 BASE) MCG/ACT IN AEPB: 2.0000 | INHALATION_SPRAY | Freq: Four times a day (QID) | RESPIRATORY_TRACT | 0 refills | Status: DC | PRN

## 2020-06-26 MED ORDER — APIXABAN 5 MG PO TABS
5.0000 mg | ORAL_TABLET | Freq: Two times a day (BID) | ORAL | 0 refills | Status: DC
Start: 2020-06-28 — End: 2020-07-28

## 2020-06-26 MED ORDER — APIXABAN 5 MG PO TABS
10.0000 mg | ORAL_TABLET | Freq: Two times a day (BID) | ORAL | 0 refills | Status: DC
Start: 2020-06-26 — End: 2020-06-26

## 2020-06-26 MED ORDER — LACTATED RINGERS IV BOLUS
500.0000 mL | Freq: Once | INTRAVENOUS | Status: AC
  Administered 2020-06-26: 500 mL via INTRAVENOUS

## 2020-06-26 MED ORDER — APIXABAN 5 MG PO TABS
10.0000 mg | ORAL_TABLET | Freq: Two times a day (BID) | ORAL | Status: DC
  Administered 2020-06-26: 10 mg via ORAL
  Filled 2020-06-26: qty 2

## 2020-06-26 MED ORDER — VALSARTAN 80 MG PO TABS
80.0000 mg | ORAL_TABLET | Freq: Every day | ORAL | 1 refills | Status: DC
Start: 2020-06-28 — End: 2020-09-01

## 2020-06-26 NOTE — Progress Notes (Signed)
   06/26/20 0100  Assess: MEWS Score  Pulse Rate (!) 43  ECG Heart Rate (!) 45  Resp (!) 25  SpO2 98 %  Assess: MEWS Score  MEWS Temp 0  MEWS Systolic 0  MEWS Pulse 1  MEWS RR 1  MEWS LOC 0  MEWS Score 2  MEWS Score Color Yellow  Assess: if the MEWS score is Yellow or Red  Were vital signs taken at a resting state? Yes  Focused Assessment No change from prior assessment  Early Detection of Sepsis Score *See Row Information* Low  MEWS guidelines implemented *See Row Information* No, vital signs rechecked (Pt has OSA, no c-pap. HR occ.low, RR vary. will recheck)

## 2020-06-26 NOTE — Progress Notes (Addendum)
ANTICOAGULATION CONSULT NOTE - Initial Consult  Pharmacy Consult for Lovenox>apixaban Indication: DVT and PE  No Known Allergies  Patient Measurements: Height: 6' (182.9 cm) Weight: (!) 156.5 kg (345 lb) IBW/kg (Calculated) : 77.6  Vital Signs: Temp: 97.6 F (36.4 C) (09/18 0526) Temp Source: Oral (09/18 0526) BP: 123/86 (09/18 0526) Pulse Rate: 55 (09/18 0600)  Labs: Recent Labs    06/24/20 0555 06/24/20 0555 06/25/20 0500 06/26/20 0104  HGB 13.9   < > 13.1 13.8  HCT 40.2  --  37.9* 40.4  PLT 212  --  196 208  CREATININE 1.19  --  1.21 1.28*   < > = values in this interval not displayed.    Estimated Creatinine Clearance: 111.4 mL/min (A) (by C-G formula based on SCr of 1.28 mg/dL (H)).   Medical History: Past Medical History:  Diagnosis Date  . Anal fissure   . Anxiety and depression   . Depression   . Diverticulitis   . Gout   . Hypertension   . Obesity   . OSA on CPAP     Medications:  Medications Prior to Admission  Medication Sig Dispense Refill Last Dose  . albuterol (VENTOLIN HFA) 108 (90 Base) MCG/ACT inhaler Inhale 2 puffs into the lungs every 6 (six) hours as needed for wheezing or shortness of breath. 8 g 01   . benzonatate (TESSALON) 100 MG capsule Take 1 capsule (100 mg total) by mouth every 8 (eight) hours. 21 capsule 0   . doxycycline (VIBRA-TABS) 100 MG tablet Take 1 tablet (100 mg total) by mouth 2 (two) times daily. 14 tablet 0   . EUTHYROX 112 MCG tablet Take 1 tablet by mouth once daily (Patient taking differently: Take 112 mcg by mouth at bedtime. ) 90 tablet 0   . ondansetron (ZOFRAN) 4 MG tablet Take 1 tablet (4 mg total) by mouth every 6 (six) hours as needed for nausea or vomiting. 20 tablet 0   . Probiotic Product (PROBIOTIC PO) Take 1 capsule by mouth daily.      . simvastatin (ZOCOR) 40 MG tablet TAKE 1 TABLET BY MOUTH AT BEDTIME (Patient taking differently: Take 40 mg by mouth at bedtime. ) 90 tablet 0   . valsartan (DIOVAN) 80  MG tablet Take 1 tablet (80 mg total) by mouth daily. 90 tablet 1    Scheduled:  . acidophilus  1 capsule Oral Daily  . albuterol  2 puff Inhalation Q6H  . baricitinib  4 mg Oral Daily  . benzonatate  100 mg Oral Q8H  . enoxaparin (LOVENOX) injection  150 mg Subcutaneous BID  . insulin aspart  0-15 Units Subcutaneous TID WC  . insulin aspart  0-5 Units Subcutaneous QHS  . lactobacillus acidophilus  2 tablet Oral TID  . levothyroxine  112 mcg Oral QHS  . methylPREDNISolone (SOLU-MEDROL) injection  20 mg Intravenous Daily  . pantoprazole  40 mg Oral Daily  . Ensure Max Protein  11 oz Oral BID  . 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 CT on 9/15 was positive for PE, pulm was consulted. He has received 5 days of enoxaparin 150 mg SQ BID, last dose given on 9/17 at 2121. SCr slightly elevated at 1.28. CBC wnl and stable.   Goal of Therapy:  Monitor platelets by anticoagulation protocol: Yes   Plan:  Stop enoxaparin this AM Start apixaban 10 mg BID  this AM and continue for 2 days to complete loading period, then transition to apixaban 5 mg BID on 9/20 Monitor CBC and s/sx of bleeding  Lulu Riding, PharmD PGY2 Pharmacy Resident Phone between 7 am - 3:30 pm: [phone]  Please check AMION for all [facility] phone numbers After 10:00 PM, call Main Pharmacy [phone]  06/26/2020 7:10 AM

## 2020-06-26 NOTE — Discharge Instructions (Signed)
[email or web address]  [phone].   Dr Thedore MinsSingh   Follow with Primary MD Sheliah Hatchabori, [name], MD in 7 days   Get CBC, CMP, 2 view Chest X ray -  checked next visit within 1 week by Primary MD    Activity: As tolerated with Full fall precautions use walker/cane & assistance as needed  Disposition Home    Diet: Heart Healthy   Special Instructions: If you have smoked or chewed Tobacco  in the last 2 yrs please stop smoking, stop any regular Alcohol  and or any Recreational drug use.  On your next visit with your primary care physician please Get Medicines reviewed and adjusted.  Please request your Prim.MD to go over all Hospital Tests and Procedure/Radiological results at the follow up, please get all Hospital records sent to your Prim MD by signing hospital release before you go home.  If you experience worsening of your admission symptoms, develop shortness of breath, life threatening emergency, suicidal or homicidal thoughts you must seek medical attention immediately by calling 911 or calling your MD immediately  if symptoms less severe.  You Must read complete instructions/literature along with all the possible adverse reactions/side effects for all the Medicines you take and that have been prescribed to you. Take any new Medicines after you have completely understood and accpet all the possible adverse reactions/side effects.                                                                            [facility]                            [address]                            [place], [place] [address]      Rodney Mcclain Riccobono was admitted to the Hospital on 06/20/2020 and Discharged  06/26/2020 and should be excused from work/school   for 21  days starting from date -  06/20/2020 , may return to work/school without any restrictions.  Call Susa RaringPrashant Mosie Angus MD, [facility]  [phone] with  questions.  Susa RaringPrashant Jenavieve Freda M.D on 06/26/2020,at 10:05 AM  [facility]   Office  [phone]      Person Under Monitoring Name: Rodney Mcclain Fukuda  Location: [address] [place] [id number]   Infection Prevention Recommendations for Individuals Confirmed to have, or Being Evaluated for, 2019 Novel Coronavirus (COVID-19) Infection Who Receive Care at Home  Individuals who are confirmed to have, or are being evaluated for, COVID-19 should follow the prevention steps below until a healthcare provider or local or state health department says they can return to normal activities.  Stay home except to get medical care You should restrict activities outside your home, except for getting medical care. Do not go to work, school, or public areas, and do not use public transportation or taxis.  Call ahead before visiting your doctor Before your medical appointment, call the healthcare provider and tell them that you have, or are being evaluated for, COVID-19 infection. This will help the healthcare providers office take steps to keep  other people from getting infected. Ask your healthcare provider to call the local or state health department.  Monitor your symptoms Seek prompt medical attention if your illness is worsening (e.g., difficulty breathing). Before going to your medical appointment, call the healthcare provider and tell them that you have, or are being evaluated for, COVID-19 infection. Ask your healthcare provider to call the local or state health department.  Wear a facemask You should wear a facemask that covers your nose and mouth when you are in the same room with other people and when you visit a healthcare provider. People who live with or visit you should also wear a facemask while they are in the same room with you.  Separate yourself from other people in your home As much as possible, you should stay in a different room from other people in your home.  Also, you should use a separate bathroom, if available.  Avoid sharing household items You should not share dishes, drinking glasses, cups, eating utensils, towels, bedding, or other items with other people in your home. After using these items, you should wash them thoroughly with soap and water.  Cover your coughs and sneezes Cover your mouth and nose with a tissue when you cough or sneeze, or you can cough or sneeze into your sleeve. Throw used tissues in a lined trash can, and immediately wash your hands with soap and water for at least 20 seconds or use an alcohol-based hand rub.  Wash your [employer] your hands often and thoroughly with soap and water for at least 20 seconds. You can use an alcohol-based hand sanitizer if soap and water are not available and if your hands are not visibly dirty. Avoid touching your eyes, nose, and mouth with unwashed hands.   Prevention Steps for Caregivers and Household Members of Individuals Confirmed to have, or Being Evaluated for, COVID-19 Infection Being Cared for in the Home  If you live with, or provide care at home for, a person confirmed to have, or being evaluated for, COVID-19 infection please follow these guidelines to prevent infection:  Follow healthcare providers instructions Make sure that you understand and can help the patient follow any healthcare provider instructions for all care.  Provide for the patients basic needs You should help the patient with basic needs in the home and provide support for getting groceries, prescriptions, and other personal needs.  Monitor the patients symptoms If they are getting sicker, call his or her medical provider and tell them that the patient has, or is being evaluated for, COVID-19 infection. This will help the healthcare providers office take steps to keep other people from getting infected. Ask the healthcare provider to call the local or state health department.  Limit the  number of people who have contact with the patient  If possible, have only one caregiver for the patient.  Other household members should stay in another home or place of residence. If this is not possible, they should stay  in another room, or be separated from the patient as much as possible. Use a separate bathroom, if available.  Restrict visitors who do not have an essential need to be in the home.  Keep older adults, very young children, and other sick people away from the patient Keep older adults, very young children, and those who have compromised immune systems or chronic health conditions away from the patient. This includes people with chronic heart, lung, or kidney conditions, diabetes, and cancer.  Ensure good ventilation  Make sure that shared spaces in the home have good air flow, such as from an air conditioner or an opened window, weather permitting.  Wash your hands often  Wash your hands often and thoroughly with soap and water for at least 20 seconds. You can use an alcohol based hand sanitizer if soap and water are not available and if your hands are not visibly dirty.  Avoid touching your eyes, nose, and mouth with unwashed hands.  Use disposable paper towels to dry your hands. If not available, use dedicated cloth towels and replace them when they become wet.  Wear a facemask and gloves  Wear a disposable facemask at all times in the room and gloves when you touch or have contact with the patients blood, body fluids, and/or secretions or excretions, such as sweat, saliva, sputum, nasal mucus, vomit, urine, or feces.  Ensure the mask fits over your nose and mouth tightly, and do not touch it during use.  Throw out disposable facemasks and gloves after using them. Do not reuse.  Wash your hands immediately after removing your facemask and gloves.  If your personal clothing becomes contaminated, carefully remove clothing and launder. Wash your hands after  handling contaminated clothing.  Place all used disposable facemasks, gloves, and other waste in a lined container before disposing them with other household waste.  Remove gloves and wash your hands immediately after handling these items.  Do not share dishes, glasses, or other household items with the patient  Avoid sharing household items. You should not share dishes, drinking glasses, cups, eating utensils, towels, bedding, or other items with a patient who is confirmed to have, or being evaluated for, COVID-19 infection.  After the person uses these items, you should wash them thoroughly with soap and water.  Wash laundry thoroughly  Immediately remove and wash clothes or bedding that have blood, body fluids, and/or secretions or excretions, such as sweat, saliva, sputum, nasal mucus, vomit, urine, or feces, on them.  Wear gloves when handling laundry from the patient.  Read and follow directions on labels of laundry or clothing items and detergent. In general, wash and dry with the warmest temperatures recommended on the label.  Clean all areas the individual has used often  Clean all touchable surfaces, such as counters, tabletops, doorknobs, bathroom fixtures, toilets, phones, keyboards, tablets, and bedside tables, every day. Also, clean any surfaces that may have blood, body fluids, and/or secretions or excretions on them.  Wear gloves when cleaning surfaces the patient has come in contact with.  Use a diluted bleach solution (e.g., dilute bleach with 1 part bleach and 10 parts water) or a household disinfectant with a label that says EPA-registered for coronaviruses. To make a bleach solution at home, add 1 tablespoon of bleach to 1 quart (4 cups) of water. For a larger supply, add  cup of bleach to 1 gallon (16 cups) of water.  Read labels of cleaning products and follow recommendations provided on product labels. Labels contain instructions for safe and effective use of the  cleaning product including precautions you should take when applying the product, such as wearing gloves or eye protection and making sure you have good ventilation during use of the product.  Remove gloves and wash hands immediately after cleaning.  Monitor yourself for signs and symptoms of illness Caregivers and household members are considered close contacts, should monitor their health, and will be asked to limit movement outside of the home to the extent possible. Follow the  monitoring steps for close contacts listed on the symptom monitoring form.   ? If you have additional questions, contact your local health department or call the epidemiologist on call at [phone] (available 24/7). ? This guidance is subject to change. For the most up-to-date guidance from [facility], please refer to their website: [employer] Information on my medicine - ELIQUIS (apixaban)  This medication education was reviewed with me or my healthcare representative as part of my discharge preparation.  The pharmacist that spoke with me during my hospital stay was:  Yujing Z  Steenwyk,, RPH  Why was Eliquis prescribed for you? Eliquis was prescribed to treat blood clots that may have been found in the veins of your legs (deep vein thrombosis) or in your lungs (pulmonary embolism) and to reduce the risk of them occurring again.  What do You need to know about Eliquis ? The starting dose is 10 mg (two 5 mg tablets) taken TWICE daily for TWO DAYS, then on (enter date)  06/28/2020  the dose is reduced to ONE 5 mg tablet taken TWICE daily.  Eliquis may be taken with or without food.   Try to take the dose about the same time in the morning and in the evening. If you have difficulty swallowing the tablet whole please discuss with your pharmacist how to take the medication safely.  Take Eliquis exactly as prescribed and DO NOT stop taking Eliquis  without talking to the doctor who prescribed the medication.  Stopping may increase your risk of developing a new blood clot.  Refill your prescription before you run out.  After discharge, you should have regular check-up appointments with your healthcare provider that is prescribing your Eliquis.    What do you do if you miss a dose? If a dose of ELIQUIS is not taken at the scheduled time, take it as soon as possible on the same day and twice-daily administration should be resumed. The dose should not be doubled to make up for a missed dose.  Important Safety Information A possible side effect of Eliquis is bleeding. You should call your healthcare provider right away if you experience any of the following: ? Bleeding from an injury or your nose that does not stop. ? Unusual colored urine (red or dark brown) or unusual colored stools (red or black). ? Unusual bruising for unknown reasons. ? A serious fall or if you hit your head (even if there is no bleeding).  Some medicines may interact with Eliquis and might increase your risk of bleeding or clotting while on Eliquis. To help avoid this, consult your healthcare provider or pharmacist prior to using any new prescription or non-prescription medications, including herbals, vitamins, non-steroidal anti-inflammatory drugs (NSAIDs) and supplements.  This website has more information on Eliquis (apixaban): http://[email or web address].com/eliquis/home

## 2020-06-26 NOTE — Progress Notes (Signed)
°   06/26/20 0200  Assess: MEWS Score  Pulse Rate (!) 50  ECG Heart Rate (!) 53  Resp (!) 27  SpO2 97 %  Assess: MEWS Score  MEWS Temp 0  MEWS Systolic 0  MEWS Pulse 0  MEWS RR 2  MEWS LOC 0  MEWS Score 2  MEWS Score Color Yellow  Assess: if the MEWS score is Yellow or Red  Were vital signs taken at a resting state? Yes  Focused Assessment No change from prior assessment  Early Detection of Sepsis Score *See Row Information* Low  MEWS guidelines implemented *See Row Information* No, vital signs rechecked (pt occasionhally tachypneic, will recheck RR)

## 2020-06-26 NOTE — Progress Notes (Signed)
Pt is ready for discharge. Discharge instructions and AVS given to patient. Pt vocalizes understanding of discharge education. Pt was removed from telemetry, IVs removed, pt family contacted for transport home. Vitals are stable and the patient has no complaints or further questions at this time.

## 2020-06-26 NOTE — Progress Notes (Signed)
   06/26/20 0500  Assess: MEWS Score  Pulse Rate (!) 47  ECG Heart Rate (!) 47  Resp (!) 25  SpO2 96 %  Assess: MEWS Score  MEWS Temp 0  MEWS Systolic 0  MEWS Pulse 1  MEWS RR 1  MEWS LOC 0  MEWS Score 2  MEWS Score Color Yellow  Assess: if the MEWS score is Yellow or Red  Were vital signs taken at a resting state? No  Focused Assessment No change from prior assessment  Early Detection of Sepsis Score *See Row Information* Low  MEWS guidelines implemented *See Row Information* No, vital signs rechecked (HR, RR elevated intermittemtly, will recheck.)

## 2020-06-26 NOTE — Discharge Summary (Signed)
Rodney Mcclain IWP:[id number] DOB: 1975-12-30 DOA: 06/20/2020  PCP: Midge Minium, MD  Admit date: 06/20/2020  Discharge date: 06/26/2020  Admitted From: Home  Disposition:  Home   Recommendations for Outpatient Follow-up:   Follow up with PCP in 1-2 weeks  PCP Please obtain BMP/CBC, 2 view CXR in 1week,  (see Discharge instructions)   PCP Please follow up on the following pending results: Review CT scan, leg ultrasound reports, repeat CBC, CMP and a two-view chest x-ray in 7 to 10 days.   Home Health: None   Equipment/Devices: 2lit o2 PRN  Consultations: None  Discharge Condition: Stable    CODE STATUS: Full    Diet Recommendation: Heart Healthy   Diet Order            Diet - low sodium heart healthy           Diet Heart Room service appropriate? Yes; Fluid consistency: Thin  Diet effective now                  Chief Complaint  Patient presents with  . Covid Positive  . Shortness of Breath     Brief history of present illness from the day of admission and additional interim summary    Rodney Nelsonis a 44 y.o.malewith medical history significant ofHTN, morbid obesity, hypothyroidism, OSA on CPAP, presented with increasing short of breath fever diarrhea. Symptoms started about 3 weeks ago, he was diagnosed with acute hypoxic respiratory failure due to COVID-19 pneumonia and admitted to the hospital.                                                                 Hospital Course   1. Acute Hypoxic Resp. Failure due to Acute Covid 19 Viral Pneumonitis during the ongoing 2020 Covid 19 Pandemic - he is unfortunately not vaccinated and had incurred severe parenchymal lung injury, he was placed on IV steroids, Remdesivir along with baricitinib, he gradually improved and currently asymptomatic on 1 L  nasal cannula oxygen at rest, will be discharged home on 2 L nasal cannula oxygen upon ambulation and 1 L at rest with close outpatient PCP follow-up within 7 to 10 days, he has finished his treatment course and his inflammatory markers have stabilized.   Recent Labs  Lab 06/20/20 1431 06/20/20 1431 06/21/20 0500 06/21/20 0500 06/21/20 0712 06/21/20 0731 06/22/20 0605 06/23/20 0815 06/24/20 0555 06/25/20 0500 06/26/20 0104  CRP 8.4*   < > 9.2*   < >  --   --  4.5* 1.8* 1.0* 0.7 0.7  DDIMER 10.76*   < > 10.89*   < >  --   --  7.74* 6.87* 7.57* 3.34* 3.44*  FERRITIN 440*  --  465*  --   --   --   --   --   --   --   --  BNP  --   --   --   --   --  42.8 96.8 899.4* 89.3 40.3  --   PROCALCITON <0.10  --   --   --  <0.10  --  <0.10 <0.10  --   --   --    < > = values in this interval not displayed.    Hepatic Function Latest Ref Rng & Units 06/26/2020 06/25/2020 06/24/2020  Total Protein 6.5 - 8.1 g/dL 5.8(L) 5.6(L) 5.9(L)  Albumin 3.5 - 5.0 g/dL 3.0(L) 2.9(L) 2.9(L)  AST 15 - 41 U/L _0 ALT 0 - 44 U/L 35 33 29  Alk Phosphatase 38 - 126 U/L 41 39 39  Total Bilirubin 0.3 - 1.2 mg/dL 0.8 0.7 0.7  Bilirubin, Direct 0.0 - 0.3 mg/dL - - -    2. 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  Clinically stable, hemodynamically stable, case was discussed with critical care physician Dr. Lynetta Mare including his CT report, continue anticoagulation now on Eliquis.  Minimum of 9 months of Eliquis.  PCP to follow.  3. Hypothyroidism.  On Synthroid.  4.  Dyslipidemia.  Placed on home dose statin.  5.  Hypertension.  Blood pressure stable, will skip home dose ARB for couple of days due to mild bump in renal function after CT contracts.  PCP to repeat CMP in a week.  6.  Morbid obesity with BMI of over 40.  Follow with PCP, with sleep apnea, will start CPAP nightly.  7.  Trivial AKI.  Likely due to CT head dated IV contrast, hydrate with IV fluids prior to  discharge, skip ARB couple of days and follow with PCP for repeat check of CMP in a week.   Discharge diagnosis     Active Problems:   COVID-19 virus infection   COVID-19    Discharge instructions    Discharge Instructions    Diet - low sodium heart healthy   Complete by: As directed    Discharge instructions   Complete by: As directed    Follow with Primary MD Midge Minium, MD in 7 days   Get CBC, CMP, 2 view Chest X ray -  checked next visit within 1 week by Primary MD    Activity: As tolerated with Full fall precautions use walker/cane & assistance as needed  Disposition Home    Diet: Heart Healthy   Special Instructions: If you have smoked or chewed Tobacco  in the last 2 yrs please stop smoking, stop any regular Alcohol  and or any Recreational drug use.  On your next visit with your primary care physician please Get Medicines reviewed and adjusted.  Please request your Prim.MD to go over all Hospital Tests and Procedure/Radiological results at the follow up, please get all Hospital records sent to your Prim MD by signing hospital release before you go home.  If you experience worsening of your admission symptoms, develop shortness of breath, life threatening emergency, suicidal or homicidal thoughts you must seek medical attention immediately by calling 911 or calling your MD immediately  if symptoms less severe.  You Must read complete instructions/literature along with all the possible adverse reactions/side effects for all the Medicines you take and that have been prescribed to you. Take any new Medicines after you have completely understood and accpet all the possible adverse reactions/side effects.      Discharge Medications   Allergies as of 06/26/2020   No Known Allergies  Medication List    STOP taking these medications   albuterol 108 (90 Base) MCG/ACT inhaler Commonly known as: VENTOLIN HFA Replaced by: ProAir RespiClick 919 (90 Base)  MCG/ACT Aepb   doxycycline 100 MG tablet Commonly known as: VIBRA-TABS     TAKE these medications   apixaban 5 MG Tabs tablet Commonly known as: ELIQUIS Take 2 tablets (10 mg total) by mouth 2 (two) times daily for 1 day.   apixaban 5 MG Tabs tablet Commonly known as: ELIQUIS Take 1 tablet (5 mg total) by mouth 2 (two) times daily. Start taking on: June 28, 2020   benzonatate 100 MG capsule Commonly known as: TESSALON Take 1 capsule (100 mg total) by mouth every 8 (eight) hours.   Euthyrox 112 MCG tablet Generic drug: levothyroxine Take 1 tablet by mouth once daily What changed:   how much to take  when to take this   ondansetron 4 MG tablet Commonly known as: ZOFRAN Take 1 tablet (4 mg total) by mouth every 6 (six) hours as needed for nausea or vomiting.   ProAir RespiClick 166 (90 Base) MCG/ACT Aepb Generic drug: Albuterol Sulfate Inhale 2 puffs into the lungs every 6 (six) hours as needed (SOB). Replaces: albuterol 108 (90 Base) MCG/ACT inhaler   PROBIOTIC PO Take 1 capsule by mouth daily.   simvastatin 40 MG tablet Commonly known as: ZOCOR TAKE 1 TABLET BY MOUTH AT BEDTIME   valsartan 80 MG tablet Commonly known as: Diovan Take 1 tablet (80 mg total) by mouth daily. Start taking on: June 28, 2020 What changed: These instructions start on June 28, 2020. If you are unsure what to do until then, ask your doctor or other care provider.            Durable Medical Equipment  (From admission, onward)         Start     Ordered   06/23/20 1117  For home use only DME oxygen  Once       Question Answer Comment  Length of Need 6 Months   Mode or (Route) Nasal cannula   Liters per Minute 2   Frequency Continuous (stationary and portable oxygen unit needed)   Oxygen conserving device Yes   Oxygen delivery system Gas      06/23/20 1116           Follow-up Information    Midge Minium, MD. Schedule an appointment as soon as  possible for a visit in 1 week(s).   Specialty: Family Medicine Contact information: [address] [place] [address] [phone]        [facility] [employer] [facility] Follow up.   Why: Your oxygen company Contact information: [address] [place] [address] [phone]               Major procedures and Radiology Reports - PLEASE review detailed and final reports thoroughly  -        CT ANGIO CHEST PE W OR WO CONTRAST  Result Date: 06/23/2020 CLINICAL DATA:  Shortness of breath COVID positive EXAM: CT ANGIOGRAPHY CHEST WITH CONTRAST TECHNIQUE: Multidetector CT imaging of the chest was performed using the standard protocol during bolus administration of intravenous contrast. Multiplanar CT image reconstructions and MIPs were obtained to evaluate the vascular anatomy. CONTRAST:  20m OMNIPAQUE IOHEXOL 350 MG/ML SOLN COMPARISON:  June 21, 2020 FINDINGS: Cardiovascular: There is slightly suboptimal opacification of the main pulmonary artery. A partially occlusive thrombus is  seen within the right main pulmonary artery extending into the right posterior lower lobe segmental and subsegmental pulmonary arterial branches. The heart is normal in size. No pericardial effusion or thickening. No evidence right heart strain. There is normal three-vessel brachiocephalic anatomy without proximal stenosis. The thoracic aorta is normal in appearance. Mediastinum/Nodes: No hilar, mediastinal, or axillary adenopathy. Thyroid gland, trachea, and esophagus demonstrate no significant findings. Lungs/Pleura: Patchy airspace opacity is seen throughout the left lung most notable within the left lower lobe with bronchial wall thickening and air bronchograms. There is also patchy airspace opacity seen at the posterior right lower lung. No pleural effusion is seen. Upper Abdomen: No acute abnormalities present in the visualized portions of the upper abdomen. Musculoskeletal: No  chest wall abnormality. No acute or significant osseous findings. Review of the MIP images confirms the above findings. IMPRESSION: Partially occlusive thrombus within the right main pulmonary artery and posterior right lower lobe segmental and subsegmental pulmonary arterial branches. No evidence of right ventricular heart strain. Multifocal patchy airspace opacities throughout both lungs, left greater than right, consistent with viral pneumonia. These results will be called to the ordering clinician or representative by the Radiologist Assistant, and communication documented in the PACS or [employer]. Electronically Signed   By: Prudencio Pair M.D.   On: 06/23/2020 15:57   DG Chest Port 1 View  Result Date: 06/21/2020 CLINICAL DATA:  Pneumonia.  COVID-19 positive. EXAM: PORTABLE CHEST 1 VIEW COMPARISON:  06/20/2020 FINDINGS: Progression of left lower lobe infiltrate.  No effusion Mild right lower lobe infiltrate with mild progression. Decreased lung volume. Negative for heart failure. IMPRESSION: Progression of left lower lobe infiltrate compatible with pneumonia. Mild right lower lobe infiltrate also with mild progression. Electronically Signed   By: Franchot Gallo M.D.   On: 06/21/2020 08:38   DG Chest Portable 1 View  Result Date: 06/20/2020 CLINICAL DATA:  44 year old who tested positive for COVID-19 6 days ago and has had 2 weeks of shortness of breath. Patient presents with acute worsening of shortness of breath and hypoxemia (oxygen saturation 80% on room air this morning). EXAM: PORTABLE CHEST 1 VIEW COMPARISON:  06/14/2020. FINDINGS: Suboptimal inspiration. Cardiac silhouette normal in size, unchanged. Since the prior examination 6 days ago, the opacity in the lingula has worsened and is now confluent. The nodular opacity in the RIGHT perihilar region persists. Mild RIGHT basilar atelectasis related to the suboptimal inspiration. No new pulmonary parenchymal abnormalities otherwise.  IMPRESSION: 1. Worsening lingular pneumonia since the prior examination 6 days ago as the opacities are now confluent. 2. Persistent nodular opacity at the RIGHT lung base. Non-emergent CT of the chest with contrast is recommended in further evaluation once the patient has been treated for the pneumonia to see if the nodular opacity persists. 3. Suboptimal inspiration accounts for RIGHT basilar atelectasis. Electronically Signed   By: Evangeline Dakin M.D.   On: 06/20/2020 12:04   DG Chest Portable 1 View  Result Date: 06/14/2020 CLINICAL DATA:  Cough shortness of breath. EXAM: PORTABLE CHEST 1 VIEW COMPARISON:  None. FINDINGS: Cardiomediastinal silhouette is normal. Mediastinal contours appear intact. There is no evidence of pleural effusion or pneumothorax. Nodular airspace opacity in the right infrahilar region. Streaky peripheral airspace opacity in the left lower lung field. Osseous structures are without acute abnormality. Soft tissues are grossly normal. IMPRESSION: 1. Nodular airspace opacity in the right infrahilar region may represent focal airspace consolidation or potentially pulmonary nodule. 2. Streaky peripheral airspace opacity in the left lower lung  field may represent atelectasis or subpleural airspace consolidation. 3. Follow-up with chest CT without contrast or PA and lateral radiograph of the chest may be considered. Electronically Signed   By: Fidela Salisbury M.D.   On: 06/14/2020 11:27   VAS [place] LOWER EXTREMITY VENOUS (DVT)  Result Date: 06/22/2020  Lower Venous DVTStudy Indications: COVID-19 positive. Elevated d-dimer (10.89).  Comparison Study: No prior study Performing Technologist: Maudry Mayhew MHA, RDMS, RVT, RDCS  Examination Guidelines: A complete evaluation includes B-mode imaging, spectral Doppler, color Doppler, and power Doppler as needed of all accessible portions of each vessel. Bilateral testing is considered an integral part of a complete examination. Limited  examinations for reoccurring indications may be performed as noted. The reflux portion of the exam is performed with the patient in reverse Trendelenburg.  +---------+---------------+---------+-----------+----------+--------------+ RIGHT    CompressibilityPhasicitySpontaneityPropertiesThrombus Aging +---------+---------------+---------+-----------+----------+--------------+ CFV      Full           Yes      Yes                                 +---------+---------------+---------+-----------+----------+--------------+ SFJ      Full                                                        +---------+---------------+---------+-----------+----------+--------------+ FV Prox  None                    No                   Acute          +---------+---------------+---------+-----------+----------+--------------+ FV Mid   None                                         Acute          +---------+---------------+---------+-----------+----------+--------------+ FV DistalNone                                         Acute          +---------+---------------+---------+-----------+----------+--------------+ PFV      None                                         Acute          +---------+---------------+---------+-----------+----------+--------------+ POP      Full           Yes      Yes                                 +---------+---------------+---------+-----------+----------+--------------+ PTV      Full                    Yes                                 +---------+---------------+---------+-----------+----------+--------------+  PERO     None                    No                   Acute          +---------+---------------+---------+-----------+----------+--------------+   +---------+---------------+---------+-----------+----------+--------------+ LEFT     CompressibilityPhasicitySpontaneityPropertiesThrombus Aging  +---------+---------------+---------+-----------+----------+--------------+ CFV      Full           Yes      Yes                                 +---------+---------------+---------+-----------+----------+--------------+ SFJ      Full                                                        +---------+---------------+---------+-----------+----------+--------------+ FV Prox  Full                                                        +---------+---------------+---------+-----------+----------+--------------+ FV Mid   Full                                                        +---------+---------------+---------+-----------+----------+--------------+ FV DistalFull                                                        +---------+---------------+---------+-----------+----------+--------------+ PFV      Full                                                        +---------+---------------+---------+-----------+----------+--------------+ POP      Full           Yes      Yes                                 +---------+---------------+---------+-----------+----------+--------------+ PTV      Full                    Yes                                 +---------+---------------+---------+-----------+----------+--------------+ PERO     None                    No                   Acute          +---------+---------------+---------+-----------+----------+--------------+  Summary: RIGHT: - Findings consistent with acute deep vein thrombosis involving the right femoral vein, right proximal profunda vein, and right peroneal veins. - No cystic structure found in the popliteal fossa.  LEFT: - Findings consistent with acute deep vein thrombosis involving the left peroneal veins. - No cystic structure found in the popliteal fossa.  *See table(s) above for measurements and observations. Electronically signed by Curt Jews MD on 06/22/2020 at 5:07:08 PM.    Final      Micro Results     Recent Results (from the past 240 hour(s))  Blood Culture (routine x 2)     Status: None   Collection Time: 06/20/20  2:31 PM   Specimen: BLOOD RIGHT HAND  Result Value Ref Range Status   Specimen Description BLOOD RIGHT HAND  Final   Special Requests   Final    BOTTLES DRAWN AEROBIC AND ANAEROBIC Blood Culture results may not be optimal due to an inadequate volume of blood received in culture bottles   Culture   Final    NO GROWTH 5 DAYS Performed at [facility], [place] [address], [place], [place] [address]    Report Status 06/25/2020 FINAL  Final  Blood Culture (routine x 2)     Status: None (Preliminary result)   Collection Time: 06/21/20  8:05 AM   Specimen: BLOOD LEFT HAND  Result Value Ref Range Status   Specimen Description BLOOD LEFT HAND  Final   Special Requests   Final    BOTTLES DRAWN AEROBIC AND ANAEROBIC Blood Culture results may not be optimal due to an inadequate volume of blood received in culture bottles   Culture   Final    NO GROWTH 4 DAYS Performed at [facility], [place] [address], [place], [place] [address]    Report Status PENDING  Incomplete    Today   Subjective    Rodney Mcclain today has no headache,no chest abdominal pain,no new weakness tingling or numbness, feels much better wants to go home today.    Objective   Blood pressure 124/83, pulse 62, temperature 97.8 F (36.6 C), temperature source Oral, resp. rate 15, height 6' (1.829 m), weight (!) 156.5 kg, SpO2 98 %.   Intake/Output Summary (Last 24 hours) at 06/26/2020 1013 Last data filed at 06/26/2020 0900 Gross per 24 hour  Intake 720 ml  Output --  Net 720 ml    Exam  Awake Alert, No new F.N deficits, Normal affect [place].AT,PERRAL Supple Neck,No JVD, No cervical lymphadenopathy appriciated.  Symmetrical Chest wall movement, Good air movement bilaterally, CTAB RRR,No Gallops,Rubs or new Murmurs, No Parasternal Heave +ve B.Sounds, Abd Soft, Non  tender, No organomegaly appriciated, No rebound -guarding or rigidity. No Cyanosis, Clubbing or edema, No new Rash or bruise   Data Review   CBC w Diff:  Lab Results  Component Value Date   WBC 10.4 06/26/2020   HGB 13.8 06/26/2020   HCT 40.4 06/26/2020   PLT 208 06/26/2020   LYMPHOPCT 11 06/26/2020   MONOPCT 8 06/26/2020   EOSPCT 0 06/26/2020   BASOPCT 1 06/26/2020    CMP:  Lab Results  Component Value Date   NA 138 06/26/2020   K 4.8 06/26/2020   CL 102 06/26/2020   CO2 28 06/26/2020   BUN 27 (H) 06/26/2020   CREATININE 1.28 (H) 06/26/2020   PROT 5.8 (L) 06/26/2020   ALBUMIN 3.0 (L) 06/26/2020   BILITOT 0.8 06/26/2020   ALKPHOS 41 06/26/2020   AST  26 06/26/2020   ALT 35 06/26/2020  .   Total Time in preparing paper work, data evaluation and todays exam - 50 minutes  Lala Lund M.D on 06/26/2020 at 10:13 AM  [facility]   Office  [phone]

## 2020-06-27 DIAGNOSIS — U071 COVID-19: Secondary | ICD-10-CM | POA: Diagnosis not present

## 2020-06-29 ENCOUNTER — Other Ambulatory Visit: Payer: Self-pay

## 2020-06-29 ENCOUNTER — Ambulatory Visit (INDEPENDENT_AMBULATORY_CARE_PROVIDER_SITE_OTHER): Payer: BC Managed Care – PPO | Admitting: Family Medicine

## 2020-06-29 ENCOUNTER — Encounter: Payer: Self-pay | Admitting: Family Medicine

## 2020-06-29 VITALS — BP 123/73 | HR 114 | Temp 98.0°F | Resp 18 | Ht 71.0 in | Wt 340.4 lb

## 2020-06-29 DIAGNOSIS — I2699 Other pulmonary embolism without acute cor pulmonale: Secondary | ICD-10-CM | POA: Diagnosis not present

## 2020-06-29 DIAGNOSIS — J9601 Acute respiratory failure with hypoxia: Secondary | ICD-10-CM | POA: Diagnosis not present

## 2020-06-29 DIAGNOSIS — U071 COVID-19: Secondary | ICD-10-CM | POA: Diagnosis not present

## 2020-06-29 DIAGNOSIS — M109 Gout, unspecified: Secondary | ICD-10-CM

## 2020-06-29 LAB — CBC WITH DIFFERENTIAL/PLATELET
Basophils Absolute: 0 10*3/uL (ref 0.0–0.1)
Basophils Relative: 0.3 % (ref 0.0–3.0)
Eosinophils Absolute: 0.1 10*3/uL (ref 0.0–0.7)
Eosinophils Relative: 1.4 % (ref 0.0–5.0)
HCT: 42.1 % (ref 39.0–52.0)
Hemoglobin: 14.4 g/dL (ref 13.0–17.0)
Lymphocytes Relative: 11.7 % — ABNORMAL LOW (ref 12.0–46.0)
Lymphs Abs: 1.1 10*3/uL (ref 0.7–4.0)
MCHC: 34.3 g/dL (ref 30.0–36.0)
MCV: 94.1 fl (ref 78.0–100.0)
Monocytes Absolute: 1 10*3/uL (ref 0.1–1.0)
Monocytes Relative: 10.8 % (ref 3.0–12.0)
Neutro Abs: 6.9 10*3/uL (ref 1.4–7.7)
Neutrophils Relative %: 75.8 % (ref 43.0–77.0)
Platelets: 196 10*3/uL (ref 150.0–400.0)
RBC: 4.47 Mil/uL (ref 4.22–5.81)
RDW: 13.6 % (ref 11.5–15.5)
WBC: 9.1 10*3/uL (ref 4.0–10.5)

## 2020-06-29 LAB — BASIC METABOLIC PANEL
BUN: 14 mg/dL (ref 6–23)
CO2: 31 mEq/L (ref 19–32)
Calcium: 8.7 mg/dL (ref 8.4–10.5)
Chloride: 102 mEq/L (ref 96–112)
Creatinine, Ser: 1.09 mg/dL (ref 0.40–1.50)
GFR: 72.69 mL/min (ref >60.00)
Glucose, Bld: 86 mg/dL (ref 70–99)
Potassium: 4.6 mEq/L (ref 3.5–5.1)
Sodium: 138 mEq/L (ref 135–145)

## 2020-06-29 MED ORDER — PREDNISONE 20 MG PO TABS: 40.0000 mg | ORAL_TABLET | Freq: Every day | ORAL | 0 refills | Status: AC

## 2020-06-29 NOTE — Progress Notes (Signed)
   Subjective:    Patient ID: Rodney Mcclain, male    DOB: 18-Oct-1975, 44 y.o.   MRN: [id number]  HPI Hospital f/u- pt was admitted 9/12-18 due to acute hypoxic respiratory failure from COVID.  During his hospitalization he was dx'd w/ bilateral DVTs and partially occlusive PE.  He was d/c'd on Eliquis and will need at least 9 months of anticoagulation.  Restarted his Valsartan yesterday- this was held due to increased Cr.  D/C summary recommending repeat CBC/BMP.  He was d/c'd home w/ O2 to use PRN.  He completed Remdesivir and Baricitinib during his hospitalization. Pt reports he feels 'like crap but better than a week ago'.  Using his home O2 when sleeping or as needed around the house.  HHPT was not recommended.  Due for CXR next week.  Pt's biggest complaint is fatigue and SOB.  + HAs.  No recent fevers.  Wife and daughter were also sick.  Wife received antibody infusion, daughter did not.  Pt is eating and drinking.     R ankle pain- 'I have a gout flare'  D/C summary reviewed, meds reconciled, imaging/labs/progress notes reviewed.   Review of Systems For ROS see HPI   This visit occurred during the SARS-CoV-2 public health emergency.  Safety protocols were in place, including screening questions prior to the visit, additional usage of staff PPE, and extensive cleaning of exam room while observing appropriate contact time as indicated for disinfecting solutions.       Objective:   Physical Exam Vitals reviewed.  Constitutional:      General: He is not in acute distress.    Appearance: He is obese. He is ill-appearing. He is not toxic-appearing.  HENT:     Head: Normocephalic and atraumatic.  Eyes:     Extraocular Movements: Extraocular movements intact.     Pupils: Pupils are equal, round, and reactive to light.  Cardiovascular:     Pulses: Normal pulses.     Heart sounds: Normal heart sounds.  Pulmonary:     Breath sounds: No wheezing, rhonchi or rales.  Abdominal:     General:  There is no distension.     Tenderness: There is no abdominal tenderness. There is no guarding or rebound.  Musculoskeletal:        General: No tenderness.     Right lower leg: No edema.     Left lower leg: No edema.  Skin:    General: Skin is warm and dry.  Neurological:     General: No focal deficit present.     Mental Status: He is alert and oriented to person, place, and time.  Psychiatric:     Comments: Flat affect           Assessment & Plan:

## 2020-06-29 NOTE — Patient Instructions (Addendum)
Follow up as needed (we can determine this based on symptoms and MyChart messages) We'll notify you of your lab results and make any changes if needed CONTINUE to use the O2 as needed REST! Listen to your body and do not push too hard.  This may take awhile! [facility]- you do not need an appt Continue your Eliquis twice daily Call with any questions or concerns Hang in there!!

## 2020-06-30 ENCOUNTER — Encounter: Payer: Self-pay | Admitting: General Practice

## 2020-07-01 ENCOUNTER — Inpatient Hospital Stay: Payer: BC Managed Care – PPO | Admitting: Family Medicine

## 2020-07-01 ENCOUNTER — Other Ambulatory Visit: Payer: Self-pay | Admitting: Family Medicine

## 2020-07-02 ENCOUNTER — Encounter: Payer: Self-pay | Admitting: Family Medicine

## 2020-07-04 NOTE — Assessment & Plan Note (Signed)
New.  R ankle.  Due to Eliquis he cannot take standard NSAIDs.  Will start Prednisone taper for pain relief.  Pt expressed understanding and is in agreement w/ plan.

## 2020-07-04 NOTE — Assessment & Plan Note (Addendum)
New.  Pt developed multiple PEs due to COVID infection.  Now on Eliquis BID and will need at least 9 months of anticoagulation.  Has O2 available to use at home PRN.  Will follow.

## 2020-07-04 NOTE — Assessment & Plan Note (Signed)
Secondary to COVID 19 infection.  Much improved and pt is in office today w/o O2.  Does have O2 available to use at home and admits he uses this while sleeping and if moving around the house.  Is feeling 'better than a a week ago' but still has a long way to go.  Will need a chest xray next week to reassess COVID pneumonia.  Will follow closely.

## 2020-07-04 NOTE — Assessment & Plan Note (Signed)
Ongoing issue for pt.  He is recovering but he and wife were cautioned that this can be a slow process.  He completed Remdesivir and Baricitinib while hospitalized.  Unclear what long term ramifications of infection may be but this will need to be followed closely.

## 2020-07-06 ENCOUNTER — Other Ambulatory Visit: Payer: Self-pay

## 2020-07-06 ENCOUNTER — Ambulatory Visit (HOSPITAL_BASED_OUTPATIENT_CLINIC_OR_DEPARTMENT_OTHER)
Admission: RE | Admit: 2020-07-06 | Discharge: 2020-07-06 | Disposition: A | Discharge status: Home / Self Care | Payer: BC Managed Care – PPO | Source: Ambulatory Visit | Attending: Family Medicine | Admitting: Family Medicine

## 2020-07-06 DIAGNOSIS — J9601 Acute respiratory failure with hypoxia: Secondary | ICD-10-CM | POA: Insufficient documentation

## 2020-07-06 DIAGNOSIS — U071 COVID-19: Secondary | ICD-10-CM

## 2020-07-06 DIAGNOSIS — R0602 Shortness of breath: Secondary | ICD-10-CM | POA: Diagnosis not present

## 2020-07-06 DIAGNOSIS — J189 Pneumonia, unspecified organism: Secondary | ICD-10-CM | POA: Diagnosis not present

## 2020-07-07 DIAGNOSIS — Z0279 Encounter for issue of other medical certificate: Secondary | ICD-10-CM

## 2020-07-07 NOTE — Telephone Encounter (Signed)
I have emailed FMLA forms to [email or web address] and sent to scan

## 2020-07-12 ENCOUNTER — Other Ambulatory Visit: Payer: Self-pay | Admitting: Family Medicine

## 2020-07-25 ENCOUNTER — Encounter: Payer: Self-pay | Admitting: Family Medicine

## 2020-07-26 DIAGNOSIS — M10071 Idiopathic gout, right ankle and foot: Secondary | ICD-10-CM | POA: Diagnosis not present

## 2020-07-27 DIAGNOSIS — U071 COVID-19: Secondary | ICD-10-CM | POA: Diagnosis not present

## 2020-07-28 ENCOUNTER — Other Ambulatory Visit: Payer: Self-pay | Admitting: Family Medicine

## 2020-07-28 ENCOUNTER — Encounter: Payer: Self-pay | Admitting: Family Medicine

## 2020-07-28 MED ORDER — APIXABAN 5 MG PO TABS
5.0000 mg | ORAL_TABLET | Freq: Two times a day (BID) | ORAL | 6 refills | Status: DC
Start: 2020-07-28 — End: 2021-11-16

## 2020-07-28 NOTE — Progress Notes (Signed)
Prescription sent to pharmacy at pt request 

## 2020-08-23 ENCOUNTER — Other Ambulatory Visit: Payer: Self-pay | Admitting: Family Medicine

## 2020-09-01 ENCOUNTER — Other Ambulatory Visit: Payer: Self-pay | Admitting: Family Medicine

## 2020-09-01 NOTE — Telephone Encounter (Signed)
Patient states he never picked up that prescription as he was in the hospital at the time the previous refill was sent, pharmacy gave patient an emergency 5 day supply. Please advise.

## 2020-09-12 IMAGING — DX DG CHEST 1V PORT
1 series · 1 of 1 positions shown · non-contrast
Comparison: 06/14/2020.

CLINICAL DATA: 44-year-old who tested positive for [id number] 6 days
ago and has had 2 weeks of shortness of breath. Patient presents
with acute worsening of shortness of breath and hypoxemia (oxygen
saturation 80% on room air this morning).

EXAM:
PORTABLE CHEST 1 VIEW

[chest ap]
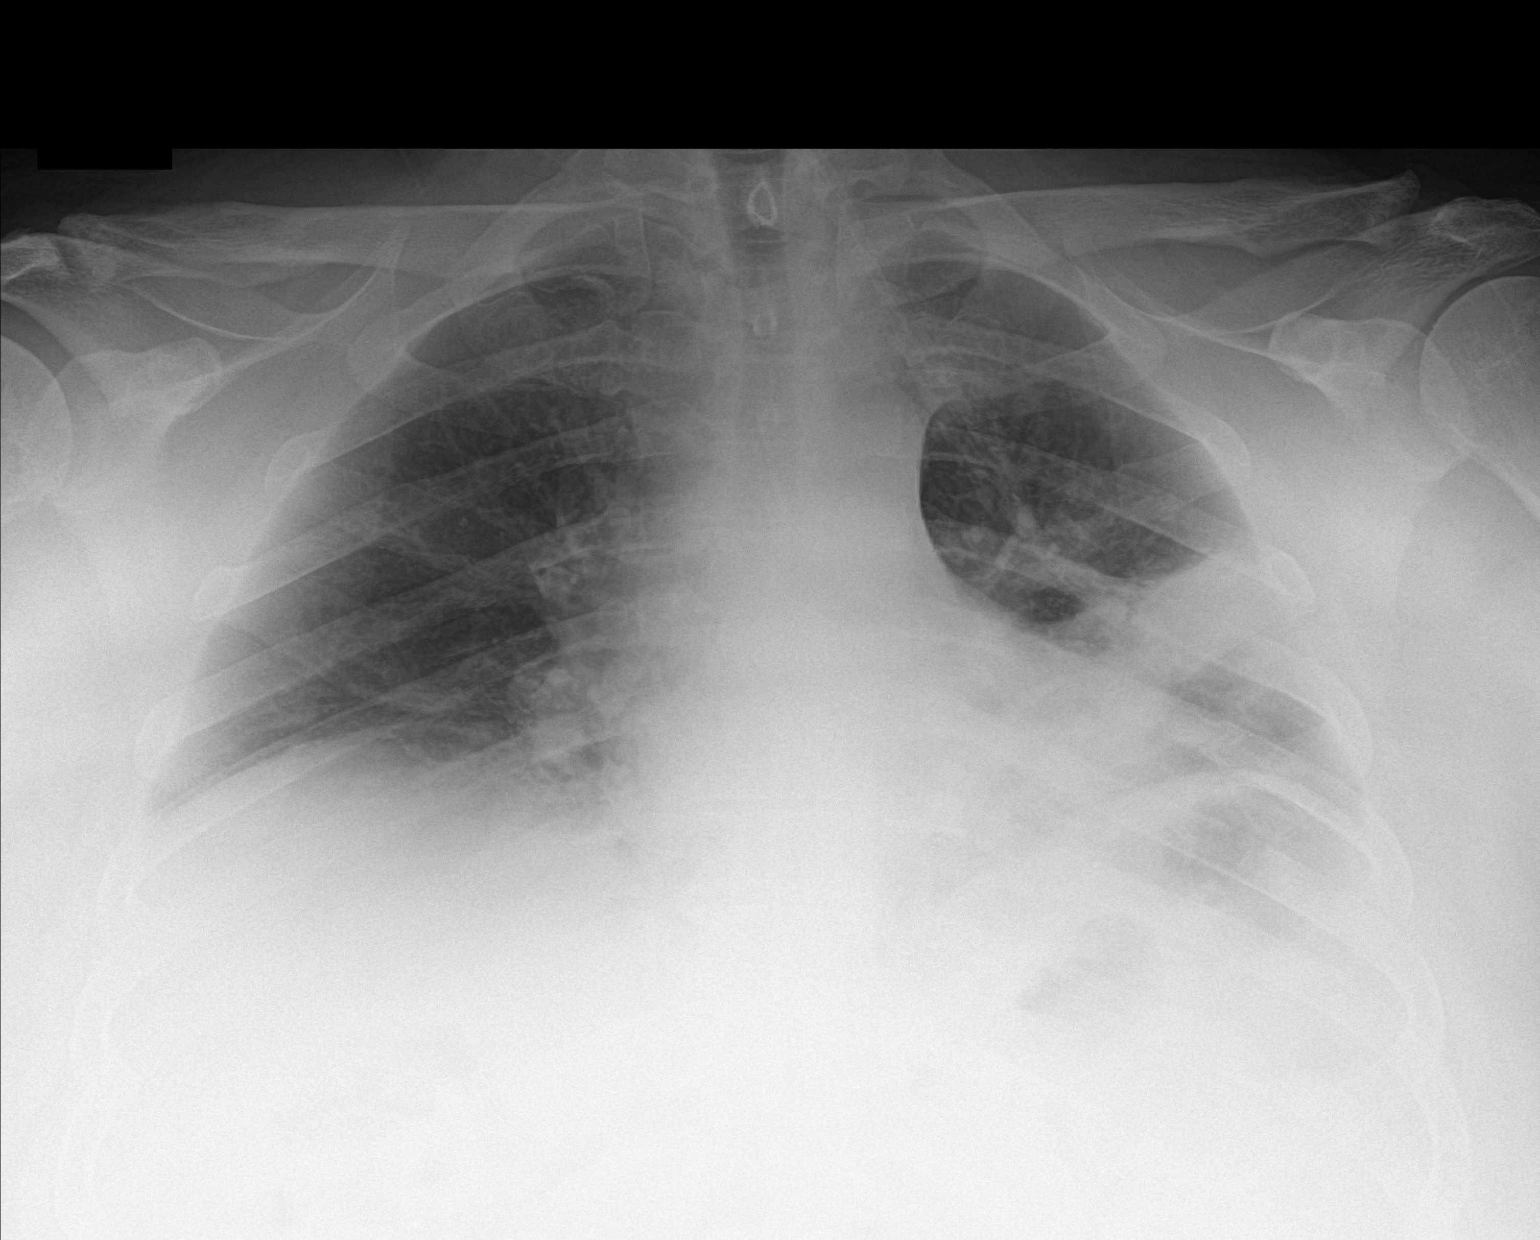

[1 of 1 positions shown; findings below may reference images not displayed]

FINDINGS: Suboptimal inspiration. Cardiac silhouette normal in size,
unchanged. Since the prior examination 6 days ago, the opacity in
the lingula has worsened and is now confluent. The nodular opacity
in the RIGHT perihilar region persists. Mild RIGHT basilar
atelectasis related to the suboptimal inspiration. No new pulmonary
parenchymal abnormalities otherwise.
IMPRESSION: 1. Worsening lingular pneumonia since the prior examination 6 days
ago as the opacities are now confluent.
2. Persistent nodular opacity at the RIGHT lung base. Non-emergent
CT of the chest with contrast is recommended in further evaluation
once the patient has been treated for the pneumonia to see if the
nodular opacity persists.
3. Suboptimal inspiration accounts for RIGHT basilar atelectasis.

## 2020-09-13 IMAGING — DX DG CHEST 1V PORT
1 series · 1 of 1 positions shown · non-contrast
Comparison: 06/20/2020

CLINICAL DATA: Pneumonia.  [id number] positive.

EXAM:
PORTABLE CHEST 1 VIEW

[chest ap]
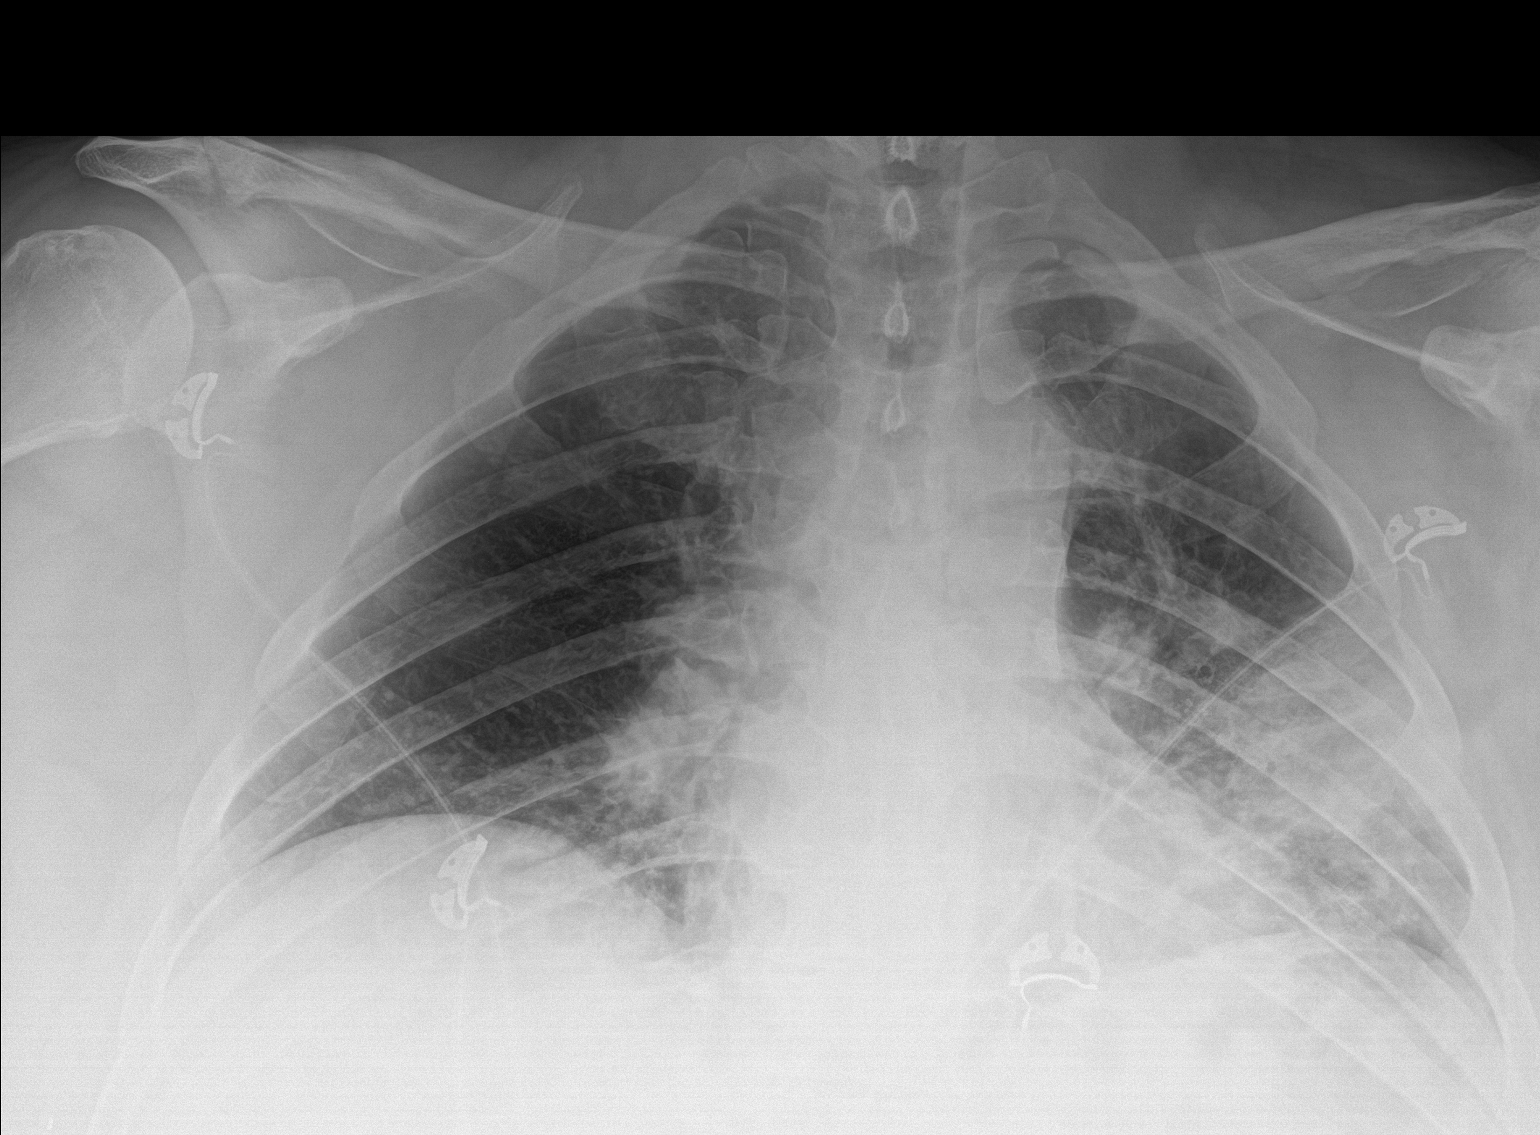

[1 of 1 positions shown; findings below may reference images not displayed]

FINDINGS: Progression of left lower lobe infiltrate.  No effusion

Mild right lower lobe infiltrate with mild progression. Decreased
lung volume. Negative for heart failure.
IMPRESSION: Progression of left lower lobe infiltrate compatible with pneumonia.
Mild right lower lobe infiltrate also with mild progression.

## 2020-09-14 ENCOUNTER — Ambulatory Visit (INDEPENDENT_AMBULATORY_CARE_PROVIDER_SITE_OTHER): Payer: BC Managed Care – PPO | Admitting: Family Medicine

## 2020-09-14 ENCOUNTER — Encounter: Payer: Self-pay | Admitting: Family Medicine

## 2020-09-14 ENCOUNTER — Other Ambulatory Visit: Payer: Self-pay

## 2020-09-14 VITALS — BP 140/88 | HR 78 | Temp 97.3°F | Resp 17 | Ht 72.0 in | Wt 360.0 lb

## 2020-09-14 DIAGNOSIS — M25551 Pain in right hip: Secondary | ICD-10-CM

## 2020-09-14 DIAGNOSIS — Z23 Encounter for immunization: Secondary | ICD-10-CM

## 2020-09-14 DIAGNOSIS — Z0000 Encounter for general adult medical examination without abnormal findings: Secondary | ICD-10-CM

## 2020-09-14 DIAGNOSIS — Z125 Encounter for screening for malignant neoplasm of prostate: Secondary | ICD-10-CM | POA: Diagnosis not present

## 2020-09-14 LAB — CBC WITH DIFFERENTIAL/PLATELET
Basophils Absolute: 0.1 10*3/uL (ref 0.0–0.1)
Basophils Relative: 2.3 % (ref 0.0–3.0)
Eosinophils Absolute: 0.4 10*3/uL (ref 0.0–0.7)
Eosinophils Relative: 8 % — ABNORMAL HIGH (ref 0.0–5.0)
HCT: 45.8 % (ref 39.0–52.0)
Hemoglobin: 15.6 g/dL (ref 13.0–17.0)
Lymphocytes Relative: 26.5 % (ref 12.0–46.0)
Lymphs Abs: 1.3 10*3/uL (ref 0.7–4.0)
MCHC: 34.1 g/dL (ref 30.0–36.0)
MCV: 93.9 fl (ref 78.0–100.0)
Monocytes Absolute: 0.5 10*3/uL (ref 0.1–1.0)
Monocytes Relative: 11.1 % (ref 3.0–12.0)
Neutro Abs: 2.5 10*3/uL (ref 1.4–7.7)
Neutrophils Relative %: 52.1 % (ref 43.0–77.0)
Platelets: 187 10*3/uL (ref 150.0–400.0)
RBC: 4.88 Mil/uL (ref 4.22–5.81)
RDW: 13.3 % (ref 11.5–15.5)
WBC: 4.7 10*3/uL (ref 4.0–10.5)

## 2020-09-14 LAB — BASIC METABOLIC PANEL
BUN: 15 mg/dL (ref 6–23)
CO2: 30 mEq/L (ref 19–32)
Calcium: 9.7 mg/dL (ref 8.4–10.5)
Chloride: 103 mEq/L (ref 96–112)
Creatinine, Ser: 1.04 mg/dL (ref 0.40–1.50)
GFR: 86.02 mL/min (ref >60.00)
Glucose, Bld: 89 mg/dL (ref 70–99)
Potassium: 4.5 mEq/L (ref 3.5–5.1)
Sodium: 141 mEq/L (ref 135–145)

## 2020-09-14 LAB — HEPATIC FUNCTION PANEL
ALT: 15 U/L (ref 0–53)
AST: 16 U/L (ref 0–37)
Albumin: 4.5 g/dL (ref 3.5–5.2)
Alkaline Phosphatase: 59 U/L (ref 39–117)
Bilirubin, Direct: 0.2 mg/dL (ref 0.0–0.3)
Total Bilirubin: 0.9 mg/dL (ref 0.2–1.2)
Total Protein: 6.7 g/dL (ref 6.0–8.3)

## 2020-09-14 LAB — LIPID PANEL
Cholesterol: 160 mg/dL (ref 0–200)
HDL: 56.8 mg/dL (ref >39.00)
LDL Cholesterol: 78 mg/dL (ref 0–99)
NonHDL: 103.02
Total CHOL/HDL Ratio: 3
Triglycerides: 124 mg/dL (ref 0.0–149.0)
VLDL: 24.8 mg/dL (ref 0.0–40.0)

## 2020-09-14 LAB — TSH: TSH: 2.29 u[IU]/mL (ref 0.35–4.50)

## 2020-09-14 LAB — PSA: PSA: 0.3 ng/mL (ref 0.10–4.00)

## 2020-09-14 NOTE — Patient Instructions (Signed)
Follow up in 6 months to recheck BP and cholesterol We'll notify you of your lab results and make any changes if needed Continue to work on healthy diet and resume exercise as able Call with any questions or concerns Stay Safe!  Stay Healthy! Happy Holidays!!!

## 2020-09-14 NOTE — Assessment & Plan Note (Signed)
Pt's PE WNL w/ exception of obesity.  UTD on colonoscopy, Tdap.  Flu given today.  Check labs.  Anticipatory guidance provided.

## 2020-09-14 NOTE — Progress Notes (Signed)
   Subjective:    Patient ID: Rodney Mcclain, male    DOB: [birth year], 44 y.o.   MRN: [id number]  HPI CPE- UTD on colonoscopy, Tdap.  Will get flu today.  Has to wait on COVID due to antibody infusion in September.  Reviewed past medical, surgical, family and social histories.   Health Maintenance  Topic Date Due  . INFLUENZA VACCINE  05/09/2020  . COVID-19 Vaccine (1) 10/08/2020 (Originally [birth year]/19/1986)  . Hepatitis C Screening  06/07/2021 (Originally [birth year])  . HIV Screening  06/07/2021 (Originally 01/19/1989)  . TETANUS/TDAP  10/11/2027      Review of Systems Patient reports no vision/hearing changes, anorexia, fever ,adenopathy, persistant/recurrent hoarseness, swallowing issues, chest pain, palpitations, edema, persistant/recurrent cough, hemoptysis, gastrointestinal  bleeding (melena, rectal bleeding), abdominal pain, excessive heart burn, GU symptoms (dysuria, hematuria, voiding/incontinence issues) syncope, focal weakness, memory loss, numbness & tingling, skin/hair/nail changes, depression, anxiety, abnormal bruising/bleeding  + 20 lb weight gain since 9/21- pt states he has changed diet for the better and doesn't understand the weight gain  + DOE since COVID  + nocturia since COVID- 1-2x/night  + R hip pain- waking him from sleep  This visit occurred during the SARS-CoV-2 public health emergency.  Safety protocols were in place, including screening questions prior to the visit, additional usage of staff PPE, and extensive cleaning of exam room while observing appropriate contact time as indicated for disinfecting solutions.       Objective:   Physical Exam General Appearance:    Alert, cooperative, no distress, appears stated age, obese  Head:    Normocephalic, without obvious abnormality, atraumatic  Eyes:    PERRL, conjunctiva/corneas clear, EOM's intact, fundi    benign, both eyes       Ears:    Normal TM's and external ear canals, both ears  Nose:   Deferred due to  COVID  Throat:   Neck:   Supple, symmetrical, trachea midline, no adenopathy;       thyroid:  No enlargement/tenderness/nodules  Back:     Symmetric, no curvature, ROM normal, no CVA tenderness  Lungs:     Clear to auscultation bilaterally, respirations unlabored  Chest wall:    No tenderness or deformity  Heart:    Regular rate and rhythm, S1 and S2 normal, no murmur, rub   or gallop  Abdomen:     Soft, non-tender, bowel sounds active all four quadrants,    no masses, no organomegaly  Genitalia:    Deferred due to COVID  Rectal:    Extremities:   Extremities normal, atraumatic, no cyanosis or edema  Pulses:   2+ and symmetric all extremities  Skin:   Skin color, texture, turgor normal, no rashes or lesions  Lymph nodes:   Cervical, supraclavicular, and axillary nodes normal  Neurologic:   CNII-XII intact. Normal strength, sensation and reflexes      throughout          Assessment & Plan:

## 2020-09-14 NOTE — Assessment & Plan Note (Signed)
Deteriorated.  Pt has gained 20 lbs since last visit.  Stressed need for healthy diet, exercise as able (he is still recovering from COVID).  Check labs to risk stratify.  Will follow.

## 2020-09-27 ENCOUNTER — Other Ambulatory Visit: Payer: Self-pay | Admitting: Family Medicine

## 2020-10-11 DIAGNOSIS — M25551 Pain in right hip: Secondary | ICD-10-CM | POA: Insufficient documentation

## 2020-10-15 DIAGNOSIS — M1611 Unilateral primary osteoarthritis, right hip: Secondary | ICD-10-CM | POA: Diagnosis not present

## 2020-10-15 DIAGNOSIS — M5459 Other low back pain: Secondary | ICD-10-CM | POA: Diagnosis not present

## 2020-10-15 DIAGNOSIS — M25551 Pain in right hip: Secondary | ICD-10-CM | POA: Diagnosis not present

## 2020-10-15 DIAGNOSIS — M5416 Radiculopathy, lumbar region: Secondary | ICD-10-CM | POA: Diagnosis not present

## 2020-10-21 ENCOUNTER — Other Ambulatory Visit: Payer: Self-pay | Admitting: Family Medicine

## 2020-11-12 DIAGNOSIS — M5416 Radiculopathy, lumbar region: Secondary | ICD-10-CM | POA: Diagnosis not present

## 2020-11-22 ENCOUNTER — Other Ambulatory Visit: Payer: Self-pay | Admitting: Family Medicine

## 2020-11-24 ENCOUNTER — Encounter: Payer: Self-pay | Admitting: Family Medicine

## 2020-11-24 ENCOUNTER — Telehealth: Payer: Self-pay | Admitting: Family Medicine

## 2020-11-24 ENCOUNTER — Other Ambulatory Visit: Payer: Self-pay

## 2020-11-24 ENCOUNTER — Telehealth (INDEPENDENT_AMBULATORY_CARE_PROVIDER_SITE_OTHER): Payer: BC Managed Care – PPO | Admitting: Family Medicine

## 2020-11-24 DIAGNOSIS — H9311 Tinnitus, right ear: Secondary | ICD-10-CM

## 2020-11-24 DIAGNOSIS — R42 Dizziness and giddiness: Secondary | ICD-10-CM

## 2020-11-24 MED ORDER — MECLIZINE HCL 25 MG PO TABS: 25.0000 mg | ORAL_TABLET | Freq: Three times a day (TID) | ORAL | 0 refills | Status: DC | PRN

## 2020-11-24 MED ORDER — ONDANSETRON HCL 4 MG PO TABS: 4.0000 mg | ORAL_TABLET | Freq: Three times a day (TID) | ORAL | 0 refills | Status: DC | PRN

## 2020-11-24 NOTE — Progress Notes (Signed)
I connected with  Rodney Mcclain on 11/24/20 by a video enabled telemedicine application and verified that I am speaking with the correct person using two identifiers.   I discussed the limitations of evaluation and management by telemedicine. The patient expressed understanding and agreed to proceed.

## 2020-11-24 NOTE — Progress Notes (Signed)
   Virtual Visit via Video   I connected with patient on 11/24/20 at  2:00 PM EST by a video enabled telemedicine application and verified that I am speaking with the correct person using two identifiers.  Location patient: Home Location provider: [facility] Persons participating in the virtual visit: Patient, Provider, CMA (Sabrina M)  I discussed the limitations of evaluation and management by telemedicine and the availability of in person appointments. The patient expressed understanding and agreed to proceed.  Subjective:   HPI:   Dizziness- 'i'm drunk all the time but i'm not drinking anything'.  Sxs started 1 week ago.  sxs are occurring daily and multiple times each day.  Room is spinning.  + nausea, no vomiting.  Nausea improves w/ Zofran.  Spinning can start w/o any movement.  Onset of sxs occurred upon waking and sitting up.  No head injury.  R ear tinnitus has worsened x2 weeks.  Denies allergy/sinus congestion.  sxs have been stable since onset.  ROS:   See pertinent positives and negatives per HPI.  Patient Active Problem List   Diagnosis Date Noted  . PE (pulmonary thromboembolism) (HCC) 06/29/2020  . COVID-19 virus infection 06/20/2020  . Hypothyroid 10/31/2019  . Snoring 01/12/2018  . Dyslipidemia 12/31/2017  . Vertigo 03/29/2017  . Gout flare 01/19/2014  . General medical examination 03/03/2011  . Morbid obesity (HCC) 01/31/2011  . Anxiety and depression 01/31/2011  . ANAL FISSURE 06/17/2009  . ALLERGIC RHINITIS DUE TO POLLEN 01/14/2009  . Essential hypertension 12/14/2008  . BACK PAIN, THORACIC REGION 12/14/2008    Social History   Tobacco Use  . Smoking status: Former Smoker    Packs/day: 1.50    Years: 18.00    Pack years: 27.00    Quit date: 10/09/2005    Years since quitting: 15.1  . Smokeless tobacco: Never Used  Substance Use Topics  . Alcohol use: Yes    Alcohol/week: 0.0 standard drinks    Comment: occ.    Current  Outpatient Medications:  .  apixaban (ELIQUIS) 5 MG TABS tablet, Take 1 tablet (5 mg total) by mouth 2 (two) times daily., Disp: 60 tablet, Rfl: 6 .  EUTHYROX 112 MCG tablet, Take 1 tablet by mouth once daily, Disp: 90 tablet, Rfl: 0 .  Probiotic Product (PROBIOTIC PO), Take 1 capsule by mouth daily. , Disp: , Rfl:  .  simvastatin (ZOCOR) 40 MG tablet, TAKE 1 TABLET BY MOUTH AT BEDTIME, Disp: 90 tablet, Rfl: 0 .  valsartan (DIOVAN) 80 MG tablet, Take 1 tablet by mouth once daily, Disp: 90 tablet, Rfl: 0  No Known Allergies  Objective:   There were no vitals taken for this visit.  AAOx3, NAD Obese NCAT, EOMI No obvious CN deficits Coloring WNL Pt is able to speak clearly, coherently without shortness of breath or increased work of breathing.  Thought process is linear.  Mood is appropriate.   Assessment and Plan:   Vertigo- new.  sxs initially sounded positional but he states he can now have dizziness w/o moving or turning his head.  Start Meclizine, Zofran PRN.  Refer to ENT for complete evaluation and tx.  Pt expressed understanding and is in agreement w/ plan.   Tinnitus- worsening, R ear.  Given tinnitus, vertigo, and possible subjective hearing loss pt may have Meniere's.  Will refer to ENT for complete evaluation   Neena Rhymes, MD 11/24/2020

## 2020-11-24 NOTE — Telephone Encounter (Signed)
Rodney Mcclain said that the ENT that you referred him to can not see him until June - Please refer him somewhere else.

## 2020-11-25 NOTE — Telephone Encounter (Signed)
Patient cannot be seen until June. Would like to see someone sooner.

## 2020-11-25 NOTE — Telephone Encounter (Signed)
Please ask Dr.Tabori where she would like me to send this pt.  Thanks,  Alcario Drought

## 2020-11-25 NOTE — Telephone Encounter (Signed)
Are you able to find him another ENT. The one he is set up with cannot see him until June.

## 2020-11-26 NOTE — Telephone Encounter (Signed)
[facility] would be fine since Dr Ezzard Standing is out so far

## 2020-11-29 NOTE — Telephone Encounter (Signed)
I have faxed over the referral to [facility] and made pt aware of this on 11/25/20 [facility]

## 2020-12-20 DIAGNOSIS — M5416 Radiculopathy, lumbar region: Secondary | ICD-10-CM | POA: Diagnosis not present

## 2020-12-24 ENCOUNTER — Other Ambulatory Visit: Payer: Self-pay | Admitting: Family Medicine

## 2020-12-29 DIAGNOSIS — H903 Sensorineural hearing loss, bilateral: Secondary | ICD-10-CM | POA: Diagnosis not present

## 2020-12-29 DIAGNOSIS — R42 Dizziness and giddiness: Secondary | ICD-10-CM | POA: Diagnosis not present

## 2020-12-29 DIAGNOSIS — H9041 Sensorineural hearing loss, unilateral, right ear, with unrestricted hearing on the contralateral side: Secondary | ICD-10-CM | POA: Diagnosis not present

## 2021-01-06 DIAGNOSIS — M5416 Radiculopathy, lumbar region: Secondary | ICD-10-CM | POA: Diagnosis not present

## 2021-01-22 DIAGNOSIS — M79672 Pain in left foot: Secondary | ICD-10-CM | POA: Diagnosis not present

## 2021-01-22 DIAGNOSIS — M7732 Calcaneal spur, left foot: Secondary | ICD-10-CM | POA: Diagnosis not present

## 2021-01-24 DIAGNOSIS — R2689 Other abnormalities of gait and mobility: Secondary | ICD-10-CM | POA: Diagnosis not present

## 2021-01-24 DIAGNOSIS — R42 Dizziness and giddiness: Secondary | ICD-10-CM | POA: Diagnosis not present

## 2021-01-24 DIAGNOSIS — H918X9 Other specified hearing loss, unspecified ear: Secondary | ICD-10-CM | POA: Diagnosis not present

## 2021-01-24 DIAGNOSIS — H9311 Tinnitus, right ear: Secondary | ICD-10-CM | POA: Diagnosis not present

## 2021-01-27 ENCOUNTER — Ambulatory Visit (INDEPENDENT_AMBULATORY_CARE_PROVIDER_SITE_OTHER): Payer: BC Managed Care – PPO

## 2021-01-27 ENCOUNTER — Other Ambulatory Visit: Payer: Self-pay

## 2021-01-27 ENCOUNTER — Ambulatory Visit (INDEPENDENT_AMBULATORY_CARE_PROVIDER_SITE_OTHER): Payer: BC Managed Care – PPO | Admitting: Podiatry

## 2021-01-27 DIAGNOSIS — M722 Plantar fascial fibromatosis: Secondary | ICD-10-CM

## 2021-01-27 DIAGNOSIS — M7752 Other enthesopathy of left foot: Secondary | ICD-10-CM | POA: Diagnosis not present

## 2021-01-28 ENCOUNTER — Encounter: Payer: Self-pay | Admitting: Podiatry

## 2021-01-28 NOTE — Progress Notes (Signed)
Subjective:  Patient ID: Rodney Mcclain, male    DOB: [birth year],  MRN: [id number]  Chief Complaint  Patient presents with  . Foot Pain    Left foot pain    45 y.o. male presents with the above complaint.  Patient presents with new complaint on left lateral ankle pain.  Patient states he is having pain along the ankle mostly and little bit in the arch.  He states that he went hiking on his foot and to the mountains which may have aggravated the pain.  He states he was having some pain before that.  Is tender to touch is swollen he would like to discuss treatment options he was treated in the past by Dr. Al Corpus for plantar fasciitis.  The heel is doing fine.  He is no longer orthotics.  He denies any other acute complaints.   Review of Systems: Negative except as noted in the HPI. Denies N/V/F/Ch.  Past Medical History:  Diagnosis Date  . Anal fissure   . Anxiety and depression   . Depression   . Diverticulitis   . Gout   . Hypertension   . Obesity   . OSA on CPAP     Current Outpatient Medications:  .  apixaban (ELIQUIS) 5 MG TABS tablet, Take 1 tablet (5 mg total) by mouth 2 (two) times daily., Disp: 60 tablet, Rfl: 6 .  EUTHYROX 112 MCG tablet, Take 1 tablet by mouth once daily, Disp: 90 tablet, Rfl: 0 .  meclizine (ANTIVERT) 25 MG tablet, Take 1 tablet (25 mg total) by mouth 3 (three) times daily as needed for dizziness., Disp: 45 tablet, Rfl: 0 .  ondansetron (ZOFRAN) 4 MG tablet, Take 1 tablet (4 mg total) by mouth every 8 (eight) hours as needed for nausea or vomiting., Disp: 30 tablet, Rfl: 0 .  Probiotic Product (PROBIOTIC PO), Take 1 capsule by mouth daily. , Disp: , Rfl:  .  simvastatin (ZOCOR) 40 MG tablet, TAKE 1 TABLET BY MOUTH AT BEDTIME, Disp: 90 tablet, Rfl: 0 .  valsartan (DIOVAN) 80 MG tablet, Take 1 tablet by mouth once daily, Disp: 90 tablet, Rfl: 0  Social History   Tobacco Use  Smoking Status Former Smoker  . Packs/day: 1.50  . Years: 18.00  . Pack years:  27.00  . Quit date: 10/09/2005  . Years since quitting: 15.3  Smokeless Tobacco Never Used    No Known Allergies Objective:  There were no vitals filed for this visit. There is no height or weight on file to calculate BMI. Constitutional Well developed. Well nourished.  Vascular Dorsalis pedis pulses palpable bilaterally. Posterior tibial pulses palpable bilaterally. Capillary refill normal to all digits.  No cyanosis or clubbing noted. Pedal hair growth normal.  Neurologic Normal speech. Oriented to person, place, and time. Epicritic sensation to light touch grossly present bilaterally.  Dermatologic Nails well groomed and normal in appearance. No open wounds. No skin lesions.  Orthopedic:  Pain on palpation left ATFL ligament/lateral ankle.  Pain with range of motion of the ankle joint.  Mild deep intra-articular pain noted.  Pain with plantarflexion inversion of the foot.  No pain with dorsiflexion inversion eversion of the foot.   Radiographs: 3 views of skeletally mature adult left foot: No fractures noted no osseous abnormalities noted.  No osteoarthritic changes noted. Assessment:   1. Capsulitis of ankle, left    Plan:  Patient was evaluated and treated and all questions answered.  Left ankle capsulitis/ATFL sprain -I explained to the  patient the etiology of capsulitis and various treatment options were discussed.  Given the amount of pain that he is having almost 10 out of 10 I believe patient will benefit from steroid injection as well.  Patient agrees with plan like to proceed with steroid -A steroid injection was performed at left lateral ankle using 1% plain Lidocaine and 10 mg of Kenalog. This was well tolerated. -If there is no improvement we will discuss MRI -He will also benefit from stability with Tri-Lock ankle brace.  Tri-Lock ankle brace was dispensed   No follow-ups on file.

## 2021-01-31 ENCOUNTER — Other Ambulatory Visit: Payer: Self-pay | Admitting: Family Medicine

## 2021-02-03 ENCOUNTER — Ambulatory Visit: Payer: BC Managed Care – PPO | Admitting: Podiatry

## 2021-02-20 ENCOUNTER — Other Ambulatory Visit: Payer: Self-pay | Admitting: Family Medicine

## 2021-02-23 ENCOUNTER — Ambulatory Visit: Payer: BC Managed Care – PPO | Admitting: Podiatry

## 2021-02-23 ENCOUNTER — Other Ambulatory Visit: Payer: Self-pay

## 2021-02-23 DIAGNOSIS — M722 Plantar fascial fibromatosis: Secondary | ICD-10-CM

## 2021-02-23 DIAGNOSIS — M216X2 Other acquired deformities of left foot: Secondary | ICD-10-CM | POA: Diagnosis not present

## 2021-02-23 DIAGNOSIS — M21862 Other specified acquired deformities of left lower leg: Secondary | ICD-10-CM

## 2021-02-23 DIAGNOSIS — M7752 Other enthesopathy of left foot: Secondary | ICD-10-CM

## 2021-02-24 ENCOUNTER — Encounter: Payer: Self-pay | Admitting: Podiatry

## 2021-02-24 NOTE — Progress Notes (Signed)
Subjective:  Patient ID: Rodney Mcclain, male    DOB: 1976-05-09,  MRN: [id number]  Chief Complaint  Patient presents with  . Foot Pain    PT stated that he is still having some pain with his ankle     45 y.o. male presents with the above complaint.  Patient presents with new complaint on left lateral ankle pain.  Patient states he is having pain along the ankle mostly and little bit in the arch.  He states that he went hiking on his foot and to the mountains which may have aggravated the pain.  He states he was having some pain before that.  Is tender to touch is swollen he would like to discuss treatment options he was treated in the past by Dr. Al Corpus for plantar fasciitis.  The heel is doing fine.  He is no longer orthotics.  He denies any other acute complaints.   Review of Systems: Negative except as noted in the HPI. Denies N/V/F/Ch.  Past Medical History:  Diagnosis Date  . Anal fissure   . Anxiety and depression   . Depression   . Diverticulitis   . Gout   . Hypertension   . Obesity   . OSA on CPAP     Current Outpatient Medications:  .  apixaban (ELIQUIS) 5 MG TABS tablet, Take 1 tablet (5 mg total) by mouth 2 (two) times daily., Disp: 60 tablet, Rfl: 6 .  EUTHYROX 112 MCG tablet, Take 1 tablet by mouth once daily, Disp: 90 tablet, Rfl: 0 .  meclizine (ANTIVERT) 25 MG tablet, Take 1 tablet (25 mg total) by mouth 3 (three) times daily as needed for dizziness., Disp: 45 tablet, Rfl: 0 .  ondansetron (ZOFRAN) 4 MG tablet, Take 1 tablet (4 mg total) by mouth every 8 (eight) hours as needed for nausea or vomiting., Disp: 30 tablet, Rfl: 0 .  Probiotic Product (PROBIOTIC PO), Take 1 capsule by mouth daily. , Disp: , Rfl:  .  simvastatin (ZOCOR) 40 MG tablet, TAKE 1 TABLET BY MOUTH AT BEDTIME, Disp: 90 tablet, Rfl: 0 .  valsartan (DIOVAN) 80 MG tablet, Take 1 tablet by mouth once daily, Disp: 90 tablet, Rfl: 0  Social History   Tobacco Use  Smoking Status Former Smoker  .  Packs/day: 1.50  . Years: 18.00  . Pack years: 27.00  . Quit date: 10/09/2005  . Years since quitting: 15.3  Smokeless Tobacco Never Used    No Known Allergies Objective:  There were no vitals filed for this visit. There is no height or weight on file to calculate BMI. Constitutional Well developed. Well nourished.  Vascular Dorsalis pedis pulses palpable bilaterally. Posterior tibial pulses palpable bilaterally. Capillary refill normal to all digits.  No cyanosis or clubbing noted. Pedal hair growth normal.  Neurologic Normal speech. Oriented to person, place, and time. Epicritic sensation to light touch grossly present bilaterally.  Dermatologic Nails well groomed and normal in appearance. No open wounds. No skin lesions.  Orthopedic:  Pain on palpation left ATFL ligament/lateral ankle.  Pain with range of motion of the ankle joint.  Mild deep intra-articular pain noted.  Pain with plantarflexion inversion of the foot.  No pain with dorsiflexion inversion eversion of the foot.  Pain at the plantar calcaneal tubercle with palpation.  Pain with dorsiflexion of the digits.  Taut plantar fascia noted.  Positive Silfverskiold test with gastrocnemius equinus   Radiographs: 3 views of skeletally mature adult left foot: No fractures noted no osseous  abnormalities noted.  No osteoarthritic changes noted. Assessment:   1. Capsulitis of ankle, left   2. Plantar fasciitis, left    Plan:  Patient was evaluated and treated and all questions answered.  Left ankle capsulitis/ATFL sprain -I explained to the patient the etiology of capsulitis and various treatment options were discussed.  Given the amount of pain that he is having almost 10 out of 10 I believe patient will benefit from steroid injection as well.  Patient agrees with plan like to proceed with steroid -A second steroid injection was performed at left lateral ankle using 1% plain Lidocaine and 10 mg of Kenalog. This was well  tolerated. -We will also place himself in a cam boot to completely immobilize.  Hold off on the Tri-Lock ankle brace for now.  I will discuss MRI during next clinical visit if there is no improvement   Plantar Fasciitis, left with underlying gastrocnemius equinus - XR reviewed as above.  - Educated on icing and stretching. Instructions given.  - Injection delivered to the plantar fascia as below. - DME: We will place himself in the cam boot. - Pharmacologic management: None  Procedure: Injection Tendon/Ligament Location: Left plantar fascia at the glabrous junction; medial approach. Skin Prep: alcohol Injectate: 0.5 cc 0.5% marcaine plain, 0.5 cc of 1% Lidocaine, 0.5 cc kenalog 10. Disposition: Patient tolerated procedure well. Injection site dressed with a band-aid.  No follow-ups on file.   No follow-ups on file.

## 2021-03-16 DIAGNOSIS — R42 Dizziness and giddiness: Secondary | ICD-10-CM | POA: Diagnosis not present

## 2021-03-16 DIAGNOSIS — H918X9 Other specified hearing loss, unspecified ear: Secondary | ICD-10-CM | POA: Diagnosis not present

## 2021-03-25 ENCOUNTER — Other Ambulatory Visit: Payer: Self-pay

## 2021-03-25 ENCOUNTER — Ambulatory Visit: Payer: BC Managed Care – PPO | Admitting: Podiatry

## 2021-03-25 DIAGNOSIS — M722 Plantar fascial fibromatosis: Secondary | ICD-10-CM | POA: Diagnosis not present

## 2021-03-25 DIAGNOSIS — M7752 Other enthesopathy of left foot: Secondary | ICD-10-CM

## 2021-03-28 DIAGNOSIS — H9313 Tinnitus, bilateral: Secondary | ICD-10-CM | POA: Diagnosis not present

## 2021-03-28 DIAGNOSIS — H903 Sensorineural hearing loss, bilateral: Secondary | ICD-10-CM | POA: Diagnosis not present

## 2021-03-28 DIAGNOSIS — H8101 Meniere's disease, right ear: Secondary | ICD-10-CM | POA: Diagnosis not present

## 2021-03-28 DIAGNOSIS — H7403 Tympanosclerosis, bilateral: Secondary | ICD-10-CM | POA: Diagnosis not present

## 2021-03-29 ENCOUNTER — Encounter: Payer: Self-pay | Admitting: Podiatry

## 2021-03-29 NOTE — Progress Notes (Signed)
Subjective:  Patient ID: Rodney Mcclain, male    DOB: 10-18-1975,  MRN: [id number]  Chief Complaint  Patient presents with   Foot Pain    Left foot pain pt stated that he is doing well no pain     45 y.o. male presents with the above complaint.  Patient presents with follow-up of left lateral ankle pain/ATFL sprain.  Patient states is doing a lot better.  His plantar fasciitis has healed up.  Injection helped considerably.  A cam boot immobilization helped him well.  He denies any other acute complaints.   Review of Systems: Negative except as noted in the HPI. Denies N/V/F/Ch.  Past Medical History:  Diagnosis Date   Anal fissure    Anxiety and depression    Depression    Diverticulitis    Gout    Hypertension    Obesity    OSA on CPAP     Current Outpatient Medications:    apixaban (ELIQUIS) 5 MG TABS tablet, Take 1 tablet (5 mg total) by mouth 2 (two) times daily., Disp: 60 tablet, Rfl: 6   EUTHYROX 112 MCG tablet, Take 1 tablet by mouth once daily, Disp: 90 tablet, Rfl: 0   meclizine (ANTIVERT) 25 MG tablet, Take 1 tablet (25 mg total) by mouth 3 (three) times daily as needed for dizziness., Disp: 45 tablet, Rfl: 0   ondansetron (ZOFRAN) 4 MG tablet, Take 1 tablet (4 mg total) by mouth every 8 (eight) hours as needed for nausea or vomiting., Disp: 30 tablet, Rfl: 0   Probiotic Product (PROBIOTIC PO), Take 1 capsule by mouth daily. , Disp: , Rfl:    simvastatin (ZOCOR) 40 MG tablet, TAKE 1 TABLET BY MOUTH AT BEDTIME, Disp: 90 tablet, Rfl: 0   valsartan (DIOVAN) 80 MG tablet, Take 1 tablet by mouth once daily, Disp: 90 tablet, Rfl: 0  Social History   Tobacco Use  Smoking Status Former   Packs/day: 1.50   Years: 18.00   Pack years: 27.00   Types: Cigarettes   Quit date: 10/09/2005   Years since quitting: 15.4  Smokeless Tobacco Never    No Known Allergies Objective:  There were no vitals filed for this visit. There is no height or weight on file to calculate  BMI. Constitutional Well developed. Well nourished.  Vascular Dorsalis pedis pulses palpable bilaterally. Posterior tibial pulses palpable bilaterally. Capillary refill normal to all digits.  No cyanosis or clubbing noted. Pedal hair growth normal.  Neurologic Normal speech. Oriented to person, place, and time. Epicritic sensation to light touch grossly present bilaterally.  Dermatologic Nails well groomed and normal in appearance. No open wounds. No skin lesions.  Orthopedic: No further pain on palpation left ATFL ligament/lateral ankle.  No further pain with range of motion of the ankle joint.  Mild deep intra-articular pain noted.  No pain with plantarflexion inversion of the foot.  No pain with dorsiflexion inversion eversion of the foot.  No further pain at the plantar calcaneal tubercle with palpation.  No pain with dorsiflexion of the digits.  Taut plantar fascia noted.  Positive Silfverskiold test with gastrocnemius equinus   Radiographs: 3 views of skeletally mature adult left foot: No fractures noted no osseous abnormalities noted.  No osteoarthritic changes noted. Assessment:   1. Capsulitis of ankle, left   2. Plantar fasciitis, left     Plan:  Patient was evaluated and treated and all questions answered.  Left ankle capsulitis/ATFL sprain -Clinically healed with a steroid injection.  I  will hold off on MRI.  Have asked him that if it starts back up again he can immobile himself in the cam boot.  If any foot and ankle issues arise in future come back and see me.  He states understanding.   Plantar Fasciitis, left with underlying gastrocnemius equinus -Clinically healed.  At this time I discussed shoe gear modification as well as offloading with orthotics.  Patient states understanding.  No follow-ups on file.   No follow-ups on file.

## 2021-03-30 ENCOUNTER — Other Ambulatory Visit: Payer: Self-pay | Admitting: Family Medicine

## 2021-03-31 MED ORDER — SIMVASTATIN 40 MG PO TABS: 40.0000 mg | ORAL_TABLET | Freq: Every day | ORAL | 0 refills | Status: DC

## 2021-04-06 ENCOUNTER — Encounter: Payer: Self-pay | Admitting: *Deleted

## 2021-04-12 DIAGNOSIS — M5416 Radiculopathy, lumbar region: Secondary | ICD-10-CM | POA: Insufficient documentation

## 2021-04-21 DIAGNOSIS — M5416 Radiculopathy, lumbar region: Secondary | ICD-10-CM | POA: Diagnosis not present

## 2021-05-09 ENCOUNTER — Other Ambulatory Visit: Payer: Self-pay | Admitting: Family Medicine

## 2021-05-23 ENCOUNTER — Other Ambulatory Visit: Payer: Self-pay | Admitting: Family Medicine

## 2021-06-27 ENCOUNTER — Other Ambulatory Visit: Payer: Self-pay | Admitting: Family Medicine

## 2021-06-27 DIAGNOSIS — H9311 Tinnitus, right ear: Secondary | ICD-10-CM | POA: Diagnosis not present

## 2021-06-27 DIAGNOSIS — H7401 Tympanosclerosis, right ear: Secondary | ICD-10-CM | POA: Diagnosis not present

## 2021-06-27 DIAGNOSIS — H903 Sensorineural hearing loss, bilateral: Secondary | ICD-10-CM | POA: Diagnosis not present

## 2021-06-27 DIAGNOSIS — R42 Dizziness and giddiness: Secondary | ICD-10-CM | POA: Diagnosis not present

## 2021-06-27 DIAGNOSIS — H9041 Sensorineural hearing loss, unilateral, right ear, with unrestricted hearing on the contralateral side: Secondary | ICD-10-CM | POA: Diagnosis not present

## 2021-06-27 DIAGNOSIS — H8101 Meniere's disease, right ear: Secondary | ICD-10-CM | POA: Diagnosis not present

## 2021-06-29 ENCOUNTER — Other Ambulatory Visit: Payer: Self-pay | Admitting: Family Medicine

## 2021-07-25 ENCOUNTER — Other Ambulatory Visit: Payer: Self-pay | Admitting: Family Medicine

## 2021-07-25 MED ORDER — SIMVASTATIN 40 MG PO TABS: 40.0000 mg | ORAL_TABLET | Freq: Every day | ORAL | 0 refills | Status: DC

## 2021-08-08 ENCOUNTER — Other Ambulatory Visit: Payer: Self-pay | Admitting: Family Medicine

## 2021-08-16 ENCOUNTER — Other Ambulatory Visit: Payer: Self-pay | Admitting: Family Medicine

## 2021-08-16 MED ORDER — LEVOTHYROXINE SODIUM 112 MCG PO TABS: 112.0000 ug | ORAL_TABLET | Freq: Every day | ORAL | 0 refills | Status: DC

## 2021-09-29 DIAGNOSIS — M5416 Radiculopathy, lumbar region: Secondary | ICD-10-CM | POA: Diagnosis not present

## 2021-10-20 ENCOUNTER — Other Ambulatory Visit: Payer: Self-pay | Admitting: Family Medicine

## 2021-11-16 ENCOUNTER — Ambulatory Visit (INDEPENDENT_AMBULATORY_CARE_PROVIDER_SITE_OTHER): Payer: BC Managed Care – PPO | Admitting: Family Medicine

## 2021-11-16 ENCOUNTER — Encounter: Payer: Self-pay | Admitting: Family Medicine

## 2021-11-16 VITALS — BP 100/78 | HR 80 | Temp 97.7°F | Resp 16 | Ht 72.0 in | Wt 319.2 lb

## 2021-11-16 DIAGNOSIS — Z0000 Encounter for general adult medical examination without abnormal findings: Secondary | ICD-10-CM

## 2021-11-16 DIAGNOSIS — H832X1 Labyrinthine dysfunction, right ear: Secondary | ICD-10-CM | POA: Insufficient documentation

## 2021-11-16 DIAGNOSIS — Z125 Encounter for screening for malignant neoplasm of prostate: Secondary | ICD-10-CM

## 2021-11-16 DIAGNOSIS — H9041 Sensorineural hearing loss, unilateral, right ear, with unrestricted hearing on the contralateral side: Secondary | ICD-10-CM | POA: Insufficient documentation

## 2021-11-16 LAB — LIPID PANEL
Cholesterol: 147 mg/dL (ref 0–200)
HDL: 58.6 mg/dL
LDL Cholesterol: 72 mg/dL (ref 0–99)
NonHDL: 88.56
Total CHOL/HDL Ratio: 3
Triglycerides: 85 mg/dL (ref 0.0–149.0)
VLDL: 17 mg/dL (ref 0.0–40.0)

## 2021-11-16 LAB — BASIC METABOLIC PANEL
BUN: 20 mg/dL (ref 6–23)
CO2: 27 mEq/L (ref 19–32)
Calcium: 10.1 mg/dL (ref 8.4–10.5)
Chloride: 103 mEq/L (ref 96–112)
Creatinine, Ser: 1.16 mg/dL (ref 0.40–1.50)
GFR: 74.84 mL/min (ref >60.00)
Glucose, Bld: 95 mg/dL (ref 70–99)
Potassium: 4.6 mEq/L (ref 3.5–5.1)
Sodium: 144 mEq/L (ref 135–145)

## 2021-11-16 LAB — CBC WITH DIFFERENTIAL/PLATELET
Basophils Absolute: 0 10*3/uL (ref 0.0–0.1)
Basophils Relative: 0.8 % (ref 0.0–3.0)
Eosinophils Absolute: 0.2 10*3/uL (ref 0.0–0.7)
Eosinophils Relative: 3.8 % (ref 0.0–5.0)
HCT: 46 % (ref 39.0–52.0)
Hemoglobin: 15.7 g/dL (ref 13.0–17.0)
Lymphocytes Relative: 24.3 % (ref 12.0–46.0)
Lymphs Abs: 1.3 10*3/uL (ref 0.7–4.0)
MCHC: 34.1 g/dL (ref 30.0–36.0)
MCV: 94.2 fl (ref 78.0–100.0)
Monocytes Absolute: 0.5 10*3/uL (ref 0.1–1.0)
Monocytes Relative: 9.6 % (ref 3.0–12.0)
Neutro Abs: 3.3 10*3/uL (ref 1.4–7.7)
Neutrophils Relative %: 61.5 % (ref 43.0–77.0)
Platelets: 188 10*3/uL (ref 150.0–400.0)
RBC: 4.88 Mil/uL (ref 4.22–5.81)
RDW: 13 % (ref 11.5–15.5)
WBC: 5.3 10*3/uL (ref 4.0–10.5)

## 2021-11-16 LAB — HEPATIC FUNCTION PANEL
ALT: 14 U/L (ref 0–53)
AST: 16 U/L (ref 0–37)
Albumin: 4.7 g/dL (ref 3.5–5.2)
Alkaline Phosphatase: 61 U/L (ref 39–117)
Bilirubin, Direct: 0.2 mg/dL (ref 0.0–0.3)
Total Bilirubin: 0.8 mg/dL (ref 0.2–1.2)
Total Protein: 7.4 g/dL (ref 6.0–8.3)

## 2021-11-16 LAB — TSH: TSH: 4.27 u[IU]/mL (ref 0.35–5.50)

## 2021-11-16 LAB — PSA: PSA: 0.54 ng/mL (ref 0.10–4.00)

## 2021-11-16 LAB — HEMOGLOBIN A1C: Hgb A1c MFr Bld: 5.3 % (ref 4.6–6.5)

## 2021-11-16 NOTE — Patient Instructions (Signed)
Follow up in 6 months to recheck BP and cholesterol We'll notify you of your lab results and make any changes if needed Keep up the good work on healthy diet and regular exercise- 40 LBS!!!!! Call with any questions or concerns Stay Safe!  Stay Healthy! Hang in there!!!

## 2021-11-16 NOTE — Assessment & Plan Note (Signed)
Pt's PE WNL w/ exception of obesity.  UTD on colonoscopy, immunizations.  Check labs.  Anticipatory guidance provided.  

## 2021-11-16 NOTE — Assessment & Plan Note (Signed)
Pt is down 40 lbs by following a low carb diet!  Applauded his efforts and encouraged him to continue.  Check labs to risk stratify.

## 2021-11-16 NOTE — Progress Notes (Signed)
° °  Subjective:    Patient ID: Rodney Mcclain, male    DOB: [birth year], 46 y.o.   MRN: [id number]  HPI CPE- pt is down 40 lbs!!!  UTD on colonoscopy, flu, Tdap  Health Maintenance  Topic Date Due   HIV Screening  Never done   Hepatitis C Screening  Never done   COLONOSCOPY (Pts 45-70yrs Insurance coverage will need to be confirmed)  02/01/2025   TETANUS/TDAP  10/11/2027   INFLUENZA VACCINE  Completed   HPV VACCINES  Aged Out   COVID-19 Vaccine  Discontinued      Review of Systems Patient reports no vision/hearing changes, anorexia, fever ,adenopathy, persistant/recurrent hoarseness, swallowing issues, chest pain, palpitations, edema, persistant/recurrent cough, hemoptysis, dyspnea (rest,exertional, paroxysmal nocturnal), gastrointestinal  bleeding (melena, rectal bleeding), abdominal pain, excessive heart burn, syncope, focal weakness, memory loss, skin/hair/nail changes, depression, anxiety, abnormal bruising/bleeding, musculoskeletal symptoms/signs.   + nocturia + numbness of fingertips  This visit occurred during the SARS-CoV-2 public health emergency.  Safety protocols were in place, including screening questions prior to the visit, additional usage of staff PPE, and extensive cleaning of exam room while observing appropriate contact time as indicated for disinfecting solutions.      Objective:   Physical Exam General Appearance:    Alert, cooperative, no distress, appears stated age, obese  Head:    Normocephalic, without obvious abnormality, atraumatic  Eyes:    PERRL, conjunctiva/corneas clear, EOM's intact, fundi    benign, both eyes       Ears:    Normal TM's and external ear canals, both ears  Nose:   Deferred due to COVID  Throat:   Neck:   Supple, symmetrical, trachea midline, no adenopathy;       thyroid:  No enlargement/tenderness/nodules  Back:     Symmetric, no curvature, ROM normal, no CVA tenderness  Lungs:     Clear to auscultation bilaterally, respirations  unlabored  Chest wall:    No tenderness or deformity  Heart:    Regular rate and rhythm, S1 and S2 normal, no murmur, rub   or gallop  Abdomen:     Soft, non-tender, bowel sounds active all four quadrants,    no masses, no organomegaly  Genitalia:    deferred  Rectal:    Extremities:   Extremities normal, atraumatic, no cyanosis or edema  Pulses:   2+ and symmetric all extremities  Skin:   Skin color, texture, turgor normal, no rashes or lesions  Lymph nodes:   Cervical, supraclavicular, and axillary nodes normal  Neurologic:   CNII-XII intact. Normal strength, sensation and reflexes      throughout          Assessment & Plan:

## 2021-11-18 ENCOUNTER — Telehealth: Payer: Self-pay

## 2021-11-18 NOTE — Telephone Encounter (Signed)
Pt has been informed.

## 2021-11-18 NOTE — Telephone Encounter (Signed)
-----   Message from Midge Minium, MD sent at 11/17/2021  7:28 AM EST ----- Labs look great!  No change at this time

## 2021-11-21 ENCOUNTER — Other Ambulatory Visit: Payer: Self-pay | Admitting: Family Medicine

## 2021-11-22 MED ORDER — LEVOTHYROXINE SODIUM 112 MCG PO TABS: 112.0000 ug | ORAL_TABLET | Freq: Every day | ORAL | 0 refills | Status: DC

## 2021-11-23 ENCOUNTER — Telehealth: Payer: Self-pay | Admitting: Family Medicine

## 2021-11-23 NOTE — Telephone Encounter (Signed)
Client [facility] Night - C Client Site [facility] - Night Provider Lezlie Octave- MD Contact Type Call Who Is Calling Patient / Member / Family / Caregiver Caller Name Steffen Hase Caller Phone Number [phone] Patient Name Rodney Mcclain Patient DOB 02-25-1976 Call Type Message Only Information Provided Reason for Call Medication Question / Request Initial Comment Caller states he requested a refill for his blood pressure medication and it was denied and he only has 3 pills left. Additional Comment Provided hours. Disp. Time Disposition Final User 11/23/2021 4:49:53 PM General Information Provided Yes Oren Bracket Call Closed By: Oren Bracket Transaction Date/Time: 11/23/2021 4:47:21 PM (ET)

## 2021-11-24 NOTE — Telephone Encounter (Signed)
FYI about patient

## 2021-11-24 NOTE — Telephone Encounter (Signed)
Called patient and informed he states the [facility] has reach out about his medication now being ready for pick up, no concerns from pt

## 2021-11-24 NOTE — Telephone Encounter (Signed)
Pt's Valsartan was sent on 2/14 to the pharmacy.  Is this what he's referring to?

## 2022-01-03 DIAGNOSIS — M5416 Radiculopathy, lumbar region: Secondary | ICD-10-CM | POA: Diagnosis not present

## 2022-01-18 ENCOUNTER — Other Ambulatory Visit: Payer: Self-pay | Admitting: Family Medicine

## 2022-02-22 ENCOUNTER — Other Ambulatory Visit: Payer: Self-pay | Admitting: Family Medicine

## 2022-02-23 ENCOUNTER — Encounter: Payer: Self-pay | Admitting: Family Medicine

## 2022-02-23 DIAGNOSIS — R200 Anesthesia of skin: Secondary | ICD-10-CM

## 2022-02-23 MED ORDER — LEVOTHYROXINE SODIUM 112 MCG PO TABS: 112.0000 ug | ORAL_TABLET | Freq: Every day | ORAL | 0 refills | Status: DC

## 2022-02-28 ENCOUNTER — Ambulatory Visit: Payer: BC Managed Care – PPO | Admitting: Orthopedic Surgery

## 2022-02-28 DIAGNOSIS — R200 Anesthesia of skin: Secondary | ICD-10-CM

## 2022-02-28 DIAGNOSIS — M65352 Trigger finger, left little finger: Secondary | ICD-10-CM

## 2022-02-28 MED ORDER — BETAMETHASONE SOD PHOS & ACET 6 (3-3) MG/ML IJ SUSP
6.0000 mg | INTRAMUSCULAR | Status: AC | PRN
  Administered 2022-02-28: 6 mg via INTRA_ARTICULAR

## 2022-02-28 MED ORDER — LIDOCAINE HCL 1 % IJ SOLN
1.0000 mL | INTRAMUSCULAR | Status: AC | PRN
  Administered 2022-02-28: 1 mL

## 2022-02-28 NOTE — Progress Notes (Signed)
Office Visit Note   Patient: Rodney Mcclain           Date of Birth: [birth year]           MRN: [id number] Visit Date: 02/28/2022              Requested by: Sheliah Hatchabori, [name], MD [address] [place] [id number] PCP: Sheliah Hatchabori, [name], MD   Assessment & Plan: Visit Diagnoses:  1. Bilateral hand numbness   2. Acquired trigger finger of left little finger     Plan: Patient does have some findings that could be consistent with carpal tunnel syndrome although he has questionable provocative signs.  He certainly has numbness in all his fingers based on his two-point discrimination findings.  I would like to get an EMG/nerve conduction study to further evaluate his symptoms.  I can see him back in the office once the study is completed.  We also reviewed the nature of trigger finger including his diagnosis, prognosis and both conservative and surgical treatment options.  After discussion, he would like to proceed with a injection in the left small finger A1 pulley for his symptomatic triggering.  Follow-Up Instructions: No follow-ups on file.   Orders:  No orders of the defined types were placed in this encounter.  No orders of the defined types were placed in this encounter.     Procedures: Hand/UE Inj: L small A1 for trigger finger on 02/28/2022 4:32 PM Indications: therapeutic and tendon swelling Details: 25 G needle, volar approach Medications: 1 mL lidocaine 1 %; 6 mg betamethasone acetate-betamethasone sodium phosphate 6 (3-3) MG/ML Procedure, treatment alternatives, risks and benefits explained, specific risks discussed. Consent was given by the patient. Immediately prior to procedure a time out was called to verify the correct patient, procedure, equipment, support staff and site/side marked as required. Patient was prepped and draped in the usual sterile fashion.      Clinical Data: No additional findings.   Subjective: Chief Complaint  Patient presents  with   Left Hand - Pain   Right Hand - Pain    This is a 46 year old right-hand-dominant male who presents with numbness and tingling involving both hands.  This been going on for about 4 months or so but has worsened over the last few weeks.  He describes numbness in both of his hands and all fingers.  The numbness is mostly at the tips of the fingers.  He does note that he wakes up 7 nights per week with numbness in the hands and has to shake his hands for symptom relief.  He does describe numbness and tingling in all the fingers today.  He denies any injury, trauma, or other possible inciting event from 4 months ago.  He does have some subjective weakness in both hands.  He has worn braces at night without symptom relief.  He has a history of hypothyroidism.  He denies history of diabetes, cervical spine issue, wrist trauma, or inflammatory arthropathy.  He has never had any electrodiagnostic studies.   Review of Systems   Objective: Vital Signs: There were no vitals taken for this visit.  Physical Exam Constitutional:      Appearance: Normal appearance.  Cardiovascular:     Rate and Rhythm: Normal rate.     Pulses: Normal pulses.  Pulmonary:     Effort: Pulmonary effort is normal.  Skin:    General: Skin is warm and dry.     Capillary Refill:  Capillary refill takes less than 2 seconds.  Neurological:     Mental Status: He is alert.    Right Hand Exam   Tenderness  The patient is experiencing no tenderness.   Other  Erythema: absent Sensation: decreased Pulse: present  Comments:  Questionable Phalen sign.  Negative Tinel and Durkan signs.  Negative Tinel at elbow w/ no ulnar nerve instability. 5/5 thenar motor strength. 2PD is 8 mm in the index/middle/ring, 7 mm in small finger, and 10 mm in thumb.    Left Hand Exam   Tenderness  The patient is experiencing no tenderness.   Other  Erythema: absent Sensation: decreased Pulse: present  Comments:  Questionable  Phalen sign.  Negative Tinel and Durkan signs.  Negative Tinel at elbow w/ no ulnar nerve instability. 5/5 thenar motor strength. 2PD is 8 mm in the index/middle/ring/small fingers and 7 mm in the thumb.       Specialty Comments:  No specialty comments available.  Imaging: No results found.   PMFS History: Patient Active Problem List   Diagnosis Date Noted   Bilateral hand numbness 02/28/2022   Acquired trigger finger of left little finger 02/28/2022   Sensorineural hearing loss (SNHL) of right ear with unrestricted hearing of left ear 11/16/2021   Vestibular hypofunction of right ear 11/16/2021   Asymmetric SNHL (sensorineural hearing loss) 12/29/2020   PE (pulmonary thromboembolism) (HCC) 06/29/2020   COVID-19 virus infection 06/20/2020   Hypothyroid 10/31/2019   Snoring 01/12/2018   Dyslipidemia 12/31/2017   Vertigo 03/29/2017   Gout flare 01/19/2014   General medical examination 03/03/2011   Morbid obesity (HCC) 01/31/2011   Anxiety and depression 01/31/2011   ANAL FISSURE 06/17/2009   ALLERGIC RHINITIS DUE TO POLLEN 01/14/2009   Essential hypertension 12/14/2008   BACK PAIN, THORACIC REGION 12/14/2008   Past Medical History:  Diagnosis Date   Anal fissure    Anxiety and depression    Depression    Diverticulitis    Gout    Hypertension    Obesity    OSA on CPAP     Family History  Problem Relation Age of Onset   Arthritis Mother    Heart disease Mother    Hypertension Mother    Diabetes Mother    Sleep apnea Mother    Arthritis Father    Kidney cancer Father    Arthritis Maternal Grandfather    Stroke Maternal Grandfather    Lung cancer Other        aunt   Hypertension Other        all grandparents   Diabetes Maternal Grandmother    Sleep apnea Sister    Sleep apnea Brother    Colon cancer Neg Hx    Colon polyps Neg Hx    Esophageal cancer Neg Hx    Gallbladder disease Neg Hx     Past Surgical History:  Procedure Laterality Date   HIP  SURGERY  1986 & 1991   bilateral pin placement   TOE SURGERY     bilateral for bone spurs   Social History   Occupational History   Occupation: Librarian, academic  Tobacco Use   Smoking status: Former    Packs/day: 1.50    Years: 18.00    Pack years: 27.00    Types: Cigarettes    Quit date: 10/09/2005    Years since quitting: 16.4   Smokeless tobacco: Never  Vaping Use   Vaping Use: Never used  Substance and Sexual Activity  Alcohol use: Yes    Alcohol/week: 0.0 standard drinks    Comment: occ.   Drug use: No   Sexual activity: Not on file

## 2022-03-14 ENCOUNTER — Encounter: Payer: Self-pay | Admitting: Physical Medicine and Rehabilitation

## 2022-03-14 ENCOUNTER — Ambulatory Visit: Payer: BC Managed Care – PPO | Admitting: Physical Medicine and Rehabilitation

## 2022-03-14 DIAGNOSIS — R202 Paresthesia of skin: Secondary | ICD-10-CM | POA: Diagnosis not present

## 2022-03-14 NOTE — Progress Notes (Unsigned)
Pt state he has numbness and tingling in both hands and wrist. Pt state the numbness and tingling is always there. Pt state he is right handed.  Numeric Pain Rating Scale and Functional Assessment Average Pain 2   In the last MONTH (on 0-10 scale) has pain interfered with the following?  1. General activity like being  able to carry out your everyday physical activities such as walking, climbing stairs, carrying groceries, or moving a chair?  Rating(9)   -BT, -Dye Allergies.

## 2022-03-15 NOTE — Progress Notes (Signed)
Rodney Mcclain - 46 y.o. male MRN [id number]  Date of birth: 01/15/1976  Office Visit Note: Visit Date: 03/14/2022 PCP: Sheliah Hatch, MD Referred by: Rodney Beards, MD  Subjective: Chief Complaint  Patient presents with   Right Hand - Numbness, Tingling   Left Hand - Tingling, Numbness   Right Wrist - Tingling, Numbness   Left Wrist - Tingling, Numbness   HPI:  Rodney Mcclain is a 46 y.o. male who comes in today at the request of Dr. Marlyne Mcclain for electrodiagnostic study of the Bilateral upper extremities.  Patient is Right hand dominant.  Chronic worsening and now very persistent bilateral hand pain with numbness and tingling somewhat nondermatomal and global.  Patient has no complaints of frank radicular symptoms.  No history of diabetes.  No history of prior electrodiagnostic study.  Has started to feel some weakness in the hands.  ROS Otherwise per HPI.  Assessment & Plan: Visit Diagnoses:    ICD-10-CM   1. Paresthesia of skin  R20.2 NCV with EMG (electromyography)      Plan: Impression: The above electrodiagnostic study is ABNORMAL and reveals evidence of a severe bilateral median nerve entrapment at the wrist (carpal tunnel syndrome) affecting sensory and motor components.   There is no significant electrodiagnostic evidence of any other focal nerve entrapment, brachial plexopathy or cervical radiculopathy in either upper limb.   Recommendations: 1.  Follow-up with referring physician. 2.  Continue current management of symptoms. 3.  Suggest surgical evaluation.  Meds & Orders: No orders of the defined types were placed in this encounter.   Orders Placed This Encounter  Procedures   NCV with EMG (electromyography)    Follow-up: Return in about 2 weeks (around 03/28/2022) for Rodney Beards, MD.   Procedures: No procedures performed  EMG & NCV Findings: Evaluation of the left median motor and the right median motor nerves showed prolonged distal  onset latency (L9.1, R12.6 ms), reduced amplitude (L2.3, R1.5 mV), and decreased conduction velocity (Elbow-Wrist, L48, R42 m/s).  The left median (across palm) sensory nerve showed prolonged distal peak latency (Wrist, 8.1 ms), reduced amplitude (6.4 V), and prolonged distal peak latency (Palm, 5.8 ms).  The right median (across palm) sensory nerve showed no response (Wrist) and prolonged distal peak latency (Palm, 3.5 ms).  All remaining nerves (as indicated in the following tables) were within normal limits.  Left vs. Right side comparison data for the median motor nerve indicates abnormal L-R latency difference (3.5 ms).  All remaining left vs. right side differences were within normal limits.    Needle evaluation of the left abductor pollicis brevis muscle showed increased insertional activity, slightly increased spontaneous activity, and diminished recruitment.  All remaining muscles (as indicated in the following table) showed no evidence of electrical instability.    Impression: The above electrodiagnostic study is ABNORMAL and reveals evidence of a severe bilateral median nerve entrapment at the wrist (carpal tunnel syndrome) affecting sensory and motor components.   There is no significant electrodiagnostic evidence of any other focal nerve entrapment, brachial plexopathy or cervical radiculopathy in either upper limb.   Recommendations: 1.  Follow-up with referring physician. 2.  Continue current management of symptoms. 3.  Suggest surgical evaluation.  ___________________________ Naaman Plummer FAAPMR Board Certified, American Board of Physical Medicine and Rehabilitation    Nerve Conduction Studies Anti Sensory Summary Table   Stim Site NR Peak (ms) Norm Peak (ms) P-T Amp (V) Norm P-T Amp Site1 Site2 Delta-P (ms) Dist (cm) Vel (  m/s) Norm Vel (m/s)  Left Median Acr Palm Anti Sensory (2nd Digit)  32.5C  Wrist    *8.1 <3.6 *6.4 >10 Wrist Palm 2.3 0.0    Palm    *5.8 <2.0 2.0          Right Median Acr Palm Anti Sensory (2nd Digit)  31.7C  Wrist *NR  <3.6  >10 Wrist Palm  0.0    Palm    *3.5 <2.0 4.3         Left Radial Anti Sensory (Base 1st Digit)  32.5C  Wrist    2.0 <3.1 15.7  Wrist Base 1st Digit 2.0 0.0    Right Radial Anti Sensory (Base 1st Digit)  31.6C  Wrist    2.2 <3.1 14.5  Wrist Base 1st Digit 2.2 0.0    Left Ulnar Anti Sensory (5th Digit)  32.7C  Wrist    3.1 <3.7 19.2 >15.0 Wrist 5th Digit 3.1 14.0 45 >38  Right Ulnar Anti Sensory (5th Digit)  31.9C  Wrist    3.2 <3.7 20.1 >15.0 Wrist 5th Digit 3.2 14.0 44 >38   Motor Summary Table   Stim Site NR Onset (ms) Norm Onset (ms) O-P Amp (mV) Norm O-P Amp Site1 Site2 Delta-0 (ms) Dist (cm) Vel (m/s) Norm Vel (m/s)  Left Median Motor (Abd Poll Brev)  32.5C  Wrist    *9.1 <4.2 *2.3 >5 Elbow Wrist 4.8 23.0 *48 >50  Elbow    13.9  2.1         Right Median Motor (Abd Poll Brev)  32.1C  Wrist    *12.6 <4.2 *1.5 >5 Elbow Wrist 5.6 23.5 *42 >50  Elbow    18.2  2.1  Axilla Elbow 0.4 0.0    Axilla    18.6  2.1         Left Ulnar Motor (Abd Dig Min)  32.5C  Wrist    3.0 <4.2 8.2 >3 B Elbow Wrist 4.3 25.0 58 >53  B Elbow    7.3  7.0  A Elbow B Elbow 1.4 11.0 79 >53  A Elbow    8.7  7.2         Right Ulnar Motor (Abd Dig Min)  32.1C  Wrist    3.0 <4.2 9.4 >3 B Elbow Wrist 4.2 24.0 57 >53  B Elbow    7.2  7.4  A Elbow B Elbow 1.2 11.0 92 >53  A Elbow    8.4  6.8          EMG   Side Muscle Nerve Root Ins Act Fibs Psw Amp Dur Poly Recrt Int Dennie BiblePat Comment  Left Abd Poll Brev Median C8-T1 *Incr *1+ *1+ Nml Nml 0 *Reduced Nml   Left 1stDorInt Ulnar C8-T1 Nml Nml Nml Nml Nml 0 Nml Nml   Left PronatorTeres Median C6-7 Nml Nml Nml Nml Nml 0 Nml Nml   Left Biceps Musculocut C5-6 Nml Nml Nml Nml Nml 0 Nml Nml     Nerve Conduction Studies Anti Sensory Left/Right Comparison   Stim Site L Lat (ms) R Lat (ms) L-R Lat (ms) L Amp (V) R Amp (V) L-R Amp (%) Site1 Site2 L Vel (m/s) R Vel (m/s) L-R Vel (m/s)  Median  Acr Palm Anti Sensory (2nd Digit)  32.5C  Wrist *8.1   *6.4   Wrist Palm     Palm *5.8 *3.5 2.3 2.0 4.3 53.5       Radial Anti Sensory (Base 1st Digit)  32.5C  Wrist 2.0 2.2 0.2 15.7 14.5 7.6 Wrist Base 1st Digit     Ulnar Anti Sensory (5th Digit)  32.7C  Wrist 3.1 3.2 0.1 19.2 20.1 4.5 Wrist 5th Digit 45 44 1   Motor Left/Right Comparison   Stim Site L Lat (ms) R Lat (ms) L-R Lat (ms) L Amp (mV) R Amp (mV) L-R Amp (%) Site1 Site2 L Vel (m/s) R Vel (m/s) L-R Vel (m/s)  Median Motor (Abd Poll Brev)  32.5C  Wrist *9.1 *12.6 *3.5 *2.3 *1.5 34.8 Elbow Wrist *48 *42 6  Elbow 13.9 18.2 4.3 2.1 2.1 0.0       Ulnar Motor (Abd Dig Min)  32.5C  Wrist 3.0 3.0 0.0 8.2 9.4 12.8 B Elbow Wrist 58 57 1  B Elbow 7.3 7.2 0.1 7.0 7.4 5.4 A Elbow B Elbow 79 92 13  A Elbow 8.7 8.4 0.3 7.2 6.8 5.6          Waveforms:                      Clinical History: No specialty comments available.     Objective:  VS:  HT:    WT:   BMI:     BP:   HR: bpm  TEMP: ( )  RESP:  Physical Exam Musculoskeletal:        General: No tenderness.     Comments: Inspection reveals no atrophy of the bilateral APB or FDI or hand intrinsics. There is no swelling, color changes, allodynia or dystrophic changes. There is 5 out of 5 strength in the bilateral wrist extension, finger abduction and long finger flexion. There is intact sensation to light touch in all dermatomal and peripheral nerve distributions. There is a negative Froment's test bilaterally. There is a negative Tinel's test at the bilateral wrist and elbow. There is a negative Phalen's test bilaterally. There is a negative Hoffmann's test bilaterally.  Skin:    General: Skin is warm and dry.     Findings: No erythema or rash.  Neurological:     General: No focal deficit present.     Mental Status: He is alert and oriented to person, place, and time.     Sensory: No sensory deficit.     Motor: No weakness or abnormal muscle tone.      Coordination: Coordination normal.     Gait: Gait normal.  Psychiatric:        Mood and Affect: Mood normal.        Behavior: Behavior normal.        Thought Content: Thought content normal.     Imaging: No results found.

## 2022-03-15 NOTE — Procedures (Signed)
EMG & NCV Findings: Evaluation of the left median motor and the right median motor nerves showed prolonged distal onset latency (L9.1, R12.6 ms), reduced amplitude (L2.3, R1.5 mV), and decreased conduction velocity (Elbow-Wrist, L48, R42 m/s).  The left median (across palm) sensory nerve showed prolonged distal peak latency (Wrist, 8.1 ms), reduced amplitude (6.4 V), and prolonged distal peak latency (Palm, 5.8 ms).  The right median (across palm) sensory nerve showed no response (Wrist) and prolonged distal peak latency (Palm, 3.5 ms).  All remaining nerves (as indicated in the following tables) were within normal limits.  Left vs. Right side comparison data for the median motor nerve indicates abnormal L-R latency difference (3.5 ms).  All remaining left vs. right side differences were within normal limits.    Needle evaluation of the left abductor pollicis brevis muscle showed increased insertional activity, slightly increased spontaneous activity, and diminished recruitment.  All remaining muscles (as indicated in the following table) showed no evidence of electrical instability.    Impression: The above electrodiagnostic study is ABNORMAL and reveals evidence of a severe bilateral median nerve entrapment at the wrist (carpal tunnel syndrome) affecting sensory and motor components.   There is no significant electrodiagnostic evidence of any other focal nerve entrapment, brachial plexopathy or cervical radiculopathy in either upper limb.   Recommendations: 1.  Follow-up with referring physician. 2.  Continue current management of symptoms. 3.  Suggest surgical evaluation.  ___________________________ Naaman Plummer FAAPMR Board Certified, American Board of Physical Medicine and Rehabilitation    Nerve Conduction Studies Anti Sensory Summary Table   Stim Site NR Peak (ms) Norm Peak (ms) P-T Amp (V) Norm P-T Amp Site1 Site2 Delta-P (ms) Dist (cm) Vel (m/s) Norm Vel (m/s)  Left Median Acr  Palm Anti Sensory (2nd Digit)  32.5C  Wrist    *8.1 <3.6 *6.4 >10 Wrist Palm 2.3 0.0    Palm    *5.8 <2.0 2.0         Right Median Acr Palm Anti Sensory (2nd Digit)  31.7C  Wrist *NR  <3.6  >10 Wrist Palm  0.0    Palm    *3.5 <2.0 4.3         Left Radial Anti Sensory (Base 1st Digit)  32.5C  Wrist    2.0 <3.1 15.7  Wrist Base 1st Digit 2.0 0.0    Right Radial Anti Sensory (Base 1st Digit)  31.6C  Wrist    2.2 <3.1 14.5  Wrist Base 1st Digit 2.2 0.0    Left Ulnar Anti Sensory (5th Digit)  32.7C  Wrist    3.1 <3.7 19.2 >15.0 Wrist 5th Digit 3.1 14.0 45 >38  Right Ulnar Anti Sensory (5th Digit)  31.9C  Wrist    3.2 <3.7 20.1 >15.0 Wrist 5th Digit 3.2 14.0 44 >38   Motor Summary Table   Stim Site NR Onset (ms) Norm Onset (ms) O-P Amp (mV) Norm O-P Amp Site1 Site2 Delta-0 (ms) Dist (cm) Vel (m/s) Norm Vel (m/s)  Left Median Motor (Abd Poll Brev)  32.5C  Wrist    *9.1 <4.2 *2.3 >5 Elbow Wrist 4.8 23.0 *48 >50  Elbow    13.9  2.1         Right Median Motor (Abd Poll Brev)  32.1C  Wrist    *12.6 <4.2 *1.5 >5 Elbow Wrist 5.6 23.5 *42 >50  Elbow    18.2  2.1  Axilla Elbow 0.4 0.0    Axilla    18.6  2.1  Left Ulnar Motor (Abd Dig Min)  32.5C  Wrist    3.0 <4.2 8.2 >3 B Elbow Wrist 4.3 25.0 58 >53  B Elbow    7.3  7.0  A Elbow B Elbow 1.4 11.0 79 >53  A Elbow    8.7  7.2         Right Ulnar Motor (Abd Dig Min)  32.1C  Wrist    3.0 <4.2 9.4 >3 B Elbow Wrist 4.2 24.0 57 >53  B Elbow    7.2  7.4  A Elbow B Elbow 1.2 11.0 92 >53  A Elbow    8.4  6.8          EMG   Side Muscle Nerve Root Ins Act Fibs Psw Amp Dur Poly Recrt Int Fraser Din Comment  Left Abd Poll Brev Median C8-T1 *Incr *1+ *1+ Nml Nml 0 *Reduced Nml   Left 1stDorInt Ulnar C8-T1 Nml Nml Nml Nml Nml 0 Nml Nml   Left PronatorTeres Median C6-7 Nml Nml Nml Nml Nml 0 Nml Nml   Left Biceps Musculocut C5-6 Nml Nml Nml Nml Nml 0 Nml Nml     Nerve Conduction Studies Anti Sensory Left/Right Comparison   Stim Site L Lat (ms)  R Lat (ms) L-R Lat (ms) L Amp (V) R Amp (V) L-R Amp (%) Site1 Site2 L Vel (m/s) R Vel (m/s) L-R Vel (m/s)  Median Acr Palm Anti Sensory (2nd Digit)  32.5C  Wrist *8.1   *6.4   Wrist Palm     Palm *5.8 *3.5 2.3 2.0 4.3 53.5       Radial Anti Sensory (Base 1st Digit)  32.5C  Wrist 2.0 2.2 0.2 15.7 14.5 7.6 Wrist Base 1st Digit     Ulnar Anti Sensory (5th Digit)  32.7C  Wrist 3.1 3.2 0.1 19.2 20.1 4.5 Wrist 5th Digit 45 44 1   Motor Left/Right Comparison   Stim Site L Lat (ms) R Lat (ms) L-R Lat (ms) L Amp (mV) R Amp (mV) L-R Amp (%) Site1 Site2 L Vel (m/s) R Vel (m/s) L-R Vel (m/s)  Median Motor (Abd Poll Brev)  32.5C  Wrist *9.1 *12.6 *3.5 *2.3 *1.5 34.8 Elbow Wrist *48 *42 6  Elbow 13.9 18.2 4.3 2.1 2.1 0.0       Ulnar Motor (Abd Dig Min)  32.5C  Wrist 3.0 3.0 0.0 8.2 9.4 12.8 B Elbow Wrist 58 57 1  B Elbow 7.3 7.2 0.1 7.0 7.4 5.4 A Elbow B Elbow 79 92 13  A Elbow 8.7 8.4 0.3 7.2 6.8 5.6          Waveforms:

## 2022-03-17 ENCOUNTER — Ambulatory Visit (INDEPENDENT_AMBULATORY_CARE_PROVIDER_SITE_OTHER): Payer: BC Managed Care – PPO | Admitting: Orthopedic Surgery

## 2022-03-17 DIAGNOSIS — G5603 Carpal tunnel syndrome, bilateral upper limbs: Secondary | ICD-10-CM | POA: Insufficient documentation

## 2022-03-17 NOTE — H&P (View-Only) (Signed)
Office Visit Note   Patient: Rodney Mcclain           Date of Birth: [birth year]           MRN: [id number] Visit Date: 03/17/2022              Requested by: Sheliah Hatch, MD [address] [place],  [place] [address] PCP: Sheliah Hatch, MD   Assessment & Plan: Visit Diagnoses:  1. Bilateral carpal tunnel syndrome     Plan: Patient presents to review his recent EMG/nerve conduction study.  Study suggests severe bilateral carpal tunnel syndrome.  His symptoms are unchanged since previous visit.  We again discussed the nature of carpal tunnel surgery as well as his diagnosis, prognosis, both conservative and surgical treatment options.  We discussed the risks of surgical treatment including bleeding, infection, damage to the median nerve and its branches, incomplete symptom relief, and need for additional surgery.  He would like to proceed with a left carpal tunnel release to start.  A surgical date and time will be confirmed with the patient.  Follow-Up Instructions: No follow-ups on file.   Orders:  No orders of the defined types were placed in this encounter.  No orders of the defined types were placed in this encounter.     Procedures: No procedures performed   Clinical Data: No additional findings.   Subjective: Chief Complaint  Patient presents with   Right Hand - Follow-up   Left Hand - Follow-up    This is a 46 year old right-hand-dominant male who presents for follow-up of bilateral hand numbness and tingling.  This been going on for 4 months or so.  He describes numbness in both hands and all of his fingers.  He recently underwent EMG/nerve conduction study which suggested bilateral severe carpal tunnel syndrome.  He still having nocturnal symptoms 7 nights per week.  He has worn braces without symptom relief.  He is interested in discussing surgical management.    Review of Systems   Objective: Vital Signs: There were no vitals taken for this  visit.  Physical Exam Constitutional:      Appearance: Normal appearance.  Cardiovascular:     Rate and Rhythm: Normal rate.     Pulses: Normal pulses.  Pulmonary:     Effort: Pulmonary effort is normal.  Skin:    General: Skin is warm and dry.     Capillary Refill: Capillary refill takes less than 2 seconds.  Neurological:     Mental Status: He is alert.     Right Hand Exam   Other  Erythema: absent Sensation: decreased Pulse: present  Comments:  Mildly positive Phalen sign.  Negative Tinel and Durkan signs.   Left Hand Exam   Other  Erythema: absent Sensation: decreased Pulse: present  Comments:  Negative Phalen sign.  Negative Tinel and Durkan signs.      Specialty Comments:  No specialty comments available.  Imaging: No results found.   PMFS History: Patient Active Problem List   Diagnosis Date Noted   Bilateral carpal tunnel syndrome 03/17/2022   Bilateral hand numbness 02/28/2022   Acquired trigger finger of left little finger 02/28/2022   Sensorineural hearing loss (SNHL) of right ear with unrestricted hearing of left ear 11/16/2021   Vestibular hypofunction of right ear 11/16/2021   Asymmetric SNHL (sensorineural hearing loss) 12/29/2020   PE (pulmonary thromboembolism) (HCC) 06/29/2020   COVID-19 virus infection 06/20/2020   Hypothyroid 10/31/2019   Snoring 01/12/2018  Dyslipidemia 12/31/2017   Vertigo 03/29/2017   Gout flare 01/19/2014   General medical examination 03/03/2011   Morbid obesity (HCC) 01/31/2011   Anxiety and depression 01/31/2011   ANAL FISSURE 06/17/2009   ALLERGIC RHINITIS DUE TO POLLEN 01/14/2009   Essential hypertension 12/14/2008   BACK PAIN, THORACIC REGION 12/14/2008   Past Medical History:  Diagnosis Date   Anal fissure    Anxiety and depression    Depression    Diverticulitis    Gout    Hypertension    Obesity    OSA on CPAP     Family History  Problem Relation Age of Onset   Arthritis Mother     Heart disease Mother    Hypertension Mother    Diabetes Mother    Sleep apnea Mother    Arthritis Father    Kidney cancer Father    Arthritis Maternal Grandfather    Stroke Maternal Grandfather    Lung cancer Other        aunt   Hypertension Other        all grandparents   Diabetes Maternal Grandmother    Sleep apnea Sister    Sleep apnea Brother    Colon cancer Neg Hx    Colon polyps Neg Hx    Esophageal cancer Neg Hx    Gallbladder disease Neg Hx     Past Surgical History:  Procedure Laterality Date   HIP SURGERY  1986 & 1991   bilateral pin placement   TOE SURGERY     bilateral for bone spurs   Social History   Occupational History   Occupation: Librarian, academic  Tobacco Use   Smoking status: Former    Packs/day: 1.50    Years: 18.00    Total pack years: 27.00    Types: Cigarettes    Quit date: 10/09/2005    Years since quitting: 16.4   Smokeless tobacco: Never  Vaping Use   Vaping Use: Never used  Substance and Sexual Activity   Alcohol use: Yes    Alcohol/week: 0.0 standard drinks of alcohol    Comment: occ.   Drug use: No   Sexual activity: Not on file

## 2022-03-17 NOTE — Progress Notes (Addendum)
Office Visit Note   Patient: Rodney Mcclain           Date of Birth: [birth year]           MRN: [id number] Visit Date: 03/17/2022              Requested by: Sheliah Hatch, MD [address] [place],  [place] [address] PCP: Sheliah Hatch, MD   Assessment & Plan: Visit Diagnoses:  1. Bilateral carpal tunnel syndrome     Plan: Patient presents to review his recent EMG/nerve conduction study.  Study suggests severe bilateral carpal tunnel syndrome.  His symptoms are unchanged since previous visit.  We again discussed the nature of carpal tunnel surgery as well as his diagnosis, prognosis, both conservative and surgical treatment options.  We discussed the risks of surgical treatment including bleeding, infection, damage to the median nerve and its branches, incomplete symptom relief, and need for additional surgery.  He would like to proceed with a left carpal tunnel release to start.  A surgical date and time will be confirmed with the patient.  Follow-Up Instructions: No follow-ups on file.   Orders:  No orders of the defined types were placed in this encounter.  No orders of the defined types were placed in this encounter.     Procedures: No procedures performed   Clinical Data: No additional findings.   Subjective: Chief Complaint  Patient presents with   Right Hand - Follow-up   Left Hand - Follow-up    This is a 46 year old right-hand-dominant male who presents for follow-up of bilateral hand numbness and tingling.  This been going on for 4 months or so.  He describes numbness in both hands and all of his fingers.  He recently underwent EMG/nerve conduction study which suggested bilateral severe carpal tunnel syndrome.  He still having nocturnal symptoms 7 nights per week.  He has worn braces without symptom relief.  He is interested in discussing surgical management.    Review of Systems   Objective: Vital Signs: There were no vitals taken for this  visit.  Physical Exam Constitutional:      Appearance: Normal appearance.  Cardiovascular:     Rate and Rhythm: Normal rate.     Pulses: Normal pulses.  Pulmonary:     Effort: Pulmonary effort is normal.  Skin:    General: Skin is warm and dry.     Capillary Refill: Capillary refill takes less than 2 seconds.  Neurological:     Mental Status: He is alert.     Right Hand Exam   Other  Erythema: absent Sensation: decreased Pulse: present  Comments:  Mildly positive Phalen sign.  Negative Tinel and Durkan signs.   Left Hand Exam   Other  Erythema: absent Sensation: decreased Pulse: present  Comments:  Negative Phalen sign.  Negative Tinel and Durkan signs.      Specialty Comments:  No specialty comments available.  Imaging: No results found.   PMFS History: Patient Active Problem List   Diagnosis Date Noted   Bilateral carpal tunnel syndrome 03/17/2022   Bilateral hand numbness 02/28/2022   Acquired trigger finger of left little finger 02/28/2022   Sensorineural hearing loss (SNHL) of right ear with unrestricted hearing of left ear 11/16/2021   Vestibular hypofunction of right ear 11/16/2021   Asymmetric SNHL (sensorineural hearing loss) 12/29/2020   PE (pulmonary thromboembolism) (HCC) 06/29/2020   COVID-19 virus infection 06/20/2020   Hypothyroid 10/31/2019   Snoring 01/12/2018  Dyslipidemia 12/31/2017   Vertigo 03/29/2017   Gout flare 01/19/2014   General medical examination 03/03/2011   Morbid obesity (HCC) 01/31/2011   Anxiety and depression 01/31/2011   ANAL FISSURE 06/17/2009   ALLERGIC RHINITIS DUE TO POLLEN 01/14/2009   Essential hypertension 12/14/2008   BACK PAIN, THORACIC REGION 12/14/2008   Past Medical History:  Diagnosis Date   Anal fissure    Anxiety and depression    Depression    Diverticulitis    Gout    Hypertension    Obesity    OSA on CPAP     Family History  Problem Relation Age of Onset   Arthritis Mother     Heart disease Mother    Hypertension Mother    Diabetes Mother    Sleep apnea Mother    Arthritis Father    Kidney cancer Father    Arthritis Maternal Grandfather    Stroke Maternal Grandfather    Lung cancer Other        aunt   Hypertension Other        all grandparents   Diabetes Maternal Grandmother    Sleep apnea Sister    Sleep apnea Brother    Colon cancer Neg Hx    Colon polyps Neg Hx    Esophageal cancer Neg Hx    Gallbladder disease Neg Hx     Past Surgical History:  Procedure Laterality Date   HIP SURGERY  1986 & 1991   bilateral pin placement   TOE SURGERY     bilateral for bone spurs   Social History   Occupational History   Occupation: Librarian, academic  Tobacco Use   Smoking status: Former    Packs/day: 1.50    Years: 18.00    Total pack years: 27.00    Types: Cigarettes    Quit date: 10/09/2005    Years since quitting: 16.4   Smokeless tobacco: Never  Vaping Use   Vaping Use: Never used  Substance and Sexual Activity   Alcohol use: Yes    Alcohol/week: 0.0 standard drinks of alcohol    Comment: occ.   Drug use: No   Sexual activity: Not on file

## 2022-03-27 DIAGNOSIS — H9191 Unspecified hearing loss, right ear: Secondary | ICD-10-CM | POA: Diagnosis not present

## 2022-03-27 DIAGNOSIS — H9041 Sensorineural hearing loss, unilateral, right ear, with unrestricted hearing on the contralateral side: Secondary | ICD-10-CM | POA: Diagnosis not present

## 2022-03-27 DIAGNOSIS — H8101 Meniere's disease, right ear: Secondary | ICD-10-CM | POA: Diagnosis not present

## 2022-03-29 ENCOUNTER — Other Ambulatory Visit: Payer: Self-pay

## 2022-03-29 ENCOUNTER — Encounter (HOSPITAL_BASED_OUTPATIENT_CLINIC_OR_DEPARTMENT_OTHER): Payer: Self-pay | Admitting: Orthopedic Surgery

## 2022-03-30 ENCOUNTER — Encounter (HOSPITAL_BASED_OUTPATIENT_CLINIC_OR_DEPARTMENT_OTHER)
Admission: RE | Admit: 2022-03-30 | Discharge: 2022-03-30 | Disposition: A | Discharge status: Home / Self Care | Payer: BC Managed Care – PPO | Source: Ambulatory Visit | Attending: Orthopedic Surgery | Admitting: Orthopedic Surgery

## 2022-03-30 DIAGNOSIS — I10 Essential (primary) hypertension: Secondary | ICD-10-CM | POA: Insufficient documentation

## 2022-03-30 DIAGNOSIS — Z01818 Encounter for other preprocedural examination: Secondary | ICD-10-CM | POA: Insufficient documentation

## 2022-03-30 LAB — BASIC METABOLIC PANEL
Anion gap: 8 (ref 5–15)
BUN: 18 mg/dL (ref 6–20)
CO2: 26 mmol/L (ref 22–32)
Calcium: 9.3 mg/dL (ref 8.9–10.3)
Chloride: 106 mmol/L (ref 98–111)
Creatinine, Ser: 1.07 mg/dL (ref 0.61–1.24)
GFR, Estimated: >60 mL/min (ref >60)
Glucose, Bld: 91 mg/dL (ref 70–99)
Potassium: 4.2 mmol/L (ref 3.5–5.1)
Sodium: 140 mmol/L (ref 135–145)

## 2022-04-05 ENCOUNTER — Encounter (HOSPITAL_BASED_OUTPATIENT_CLINIC_OR_DEPARTMENT_OTHER)
Admission: RE | Disposition: A | Discharge status: Home / Self Care | Payer: Self-pay | Source: Home / Self Care | Attending: Orthopedic Surgery

## 2022-04-05 ENCOUNTER — Ambulatory Visit (HOSPITAL_BASED_OUTPATIENT_CLINIC_OR_DEPARTMENT_OTHER)
Admission: RE | Admit: 2022-04-05 | Discharge: 2022-04-05 | Disposition: A | Discharge status: Home / Self Care | Payer: BC Managed Care – PPO | Attending: Orthopedic Surgery | Admitting: Orthopedic Surgery

## 2022-04-05 ENCOUNTER — Other Ambulatory Visit: Payer: Self-pay

## 2022-04-05 ENCOUNTER — Ambulatory Visit (HOSPITAL_BASED_OUTPATIENT_CLINIC_OR_DEPARTMENT_OTHER): Payer: BC Managed Care – PPO | Admitting: Certified Registered"

## 2022-04-05 ENCOUNTER — Encounter (HOSPITAL_BASED_OUTPATIENT_CLINIC_OR_DEPARTMENT_OTHER): Payer: Self-pay | Admitting: Orthopedic Surgery

## 2022-04-05 DIAGNOSIS — Z87891 Personal history of nicotine dependence: Secondary | ICD-10-CM | POA: Insufficient documentation

## 2022-04-05 DIAGNOSIS — G5603 Carpal tunnel syndrome, bilateral upper limbs: Secondary | ICD-10-CM | POA: Diagnosis not present

## 2022-04-05 DIAGNOSIS — I10 Essential (primary) hypertension: Secondary | ICD-10-CM | POA: Insufficient documentation

## 2022-04-05 DIAGNOSIS — G4733 Obstructive sleep apnea (adult) (pediatric): Secondary | ICD-10-CM | POA: Diagnosis not present

## 2022-04-05 DIAGNOSIS — G5602 Carpal tunnel syndrome, left upper limb: Secondary | ICD-10-CM

## 2022-04-05 DIAGNOSIS — Z79899 Other long term (current) drug therapy: Secondary | ICD-10-CM | POA: Insufficient documentation

## 2022-04-05 DIAGNOSIS — Z6841 Body Mass Index (BMI) 40.0 and over, adult: Secondary | ICD-10-CM | POA: Diagnosis not present

## 2022-04-05 DIAGNOSIS — F418 Other specified anxiety disorders: Secondary | ICD-10-CM | POA: Diagnosis not present

## 2022-04-05 DIAGNOSIS — E039 Hypothyroidism, unspecified: Secondary | ICD-10-CM | POA: Diagnosis not present

## 2022-04-05 HISTORY — PX: CARPAL TUNNEL RELEASE: SHX101

## 2022-04-05 SURGERY — CARPAL TUNNEL RELEASE
Anesthesia: General | Site: Wrist | Laterality: Left

## 2022-04-05 MED ORDER — FENTANYL CITRATE (PF) 100 MCG/2ML IJ SOLN: 25.0000 ug | INTRAMUSCULAR | Status: DC | PRN

## 2022-04-05 MED ORDER — DEXAMETHASONE SODIUM PHOSPHATE 10 MG/ML IJ SOLN
INTRAMUSCULAR | Status: DC | PRN
  Administered 2022-04-05: 10 mg via INTRAVENOUS

## 2022-04-05 MED ORDER — ACETAMINOPHEN 500 MG PO TABS: 1000.0000 mg | ORAL_TABLET | Freq: Once | ORAL | Status: DC | PRN

## 2022-04-05 MED ORDER — DEXAMETHASONE SODIUM PHOSPHATE 10 MG/ML IJ SOLN
INTRAMUSCULAR | Status: AC
  Filled 2022-04-05: qty 1

## 2022-04-05 MED ORDER — FENTANYL CITRATE (PF) 100 MCG/2ML IJ SOLN
INTRAMUSCULAR | Status: AC
  Filled 2022-04-05: qty 2

## 2022-04-05 MED ORDER — OXYCODONE HCL 5 MG PO TABS: 5.0000 mg | ORAL_TABLET | Freq: Once | ORAL | Status: DC | PRN

## 2022-04-05 MED ORDER — PROPOFOL 10 MG/ML IV BOLUS
INTRAVENOUS | Status: AC
  Filled 2022-04-05: qty 20

## 2022-04-05 MED ORDER — LACTATED RINGERS IV SOLN: INTRAVENOUS | Status: DC | PRN

## 2022-04-05 MED ORDER — LIDOCAINE 2% (20 MG/ML) 5 ML SYRINGE
INTRAMUSCULAR | Status: AC
Start: 2022-04-05 — End: ?
  Filled 2022-04-05: qty 5

## 2022-04-05 MED ORDER — MIDAZOLAM HCL 2 MG/2ML IJ SOLN
INTRAMUSCULAR | Status: AC
  Filled 2022-04-05: qty 2

## 2022-04-05 MED ORDER — CEFAZOLIN SODIUM-DEXTROSE 2-3 GM-%(50ML) IV SOLR
INTRAVENOUS | Status: DC | PRN
  Administered 2022-04-05: 3 g via INTRAVENOUS

## 2022-04-05 MED ORDER — ACETAMINOPHEN 160 MG/5ML PO SOLN: 1000.0000 mg | Freq: Once | ORAL | Status: DC | PRN

## 2022-04-05 MED ORDER — KETOROLAC TROMETHAMINE 30 MG/ML IJ SOLN
INTRAMUSCULAR | Status: DC | PRN
  Administered 2022-04-05: 30 mg via INTRAVENOUS

## 2022-04-05 MED ORDER — FENTANYL CITRATE (PF) 100 MCG/2ML IJ SOLN
INTRAMUSCULAR | Status: DC | PRN
  Administered 2022-04-05: 100 ug via INTRAVENOUS

## 2022-04-05 MED ORDER — ONDANSETRON HCL 4 MG/2ML IJ SOLN
INTRAMUSCULAR | Status: AC
Start: 2022-04-05 — End: ?
  Filled 2022-04-05: qty 2

## 2022-04-05 MED ORDER — BUPIVACAINE HCL (PF) 0.25 % IJ SOLN
INTRAMUSCULAR | Status: DC | PRN
  Administered 2022-04-05: 9 mL

## 2022-04-05 MED ORDER — OXYCODONE HCL 5 MG PO TABS: 5.0000 mg | ORAL_TABLET | Freq: Four times a day (QID) | ORAL | 0 refills | Status: AC | PRN

## 2022-04-05 MED ORDER — ONDANSETRON HCL 4 MG/2ML IJ SOLN
INTRAMUSCULAR | Status: DC | PRN
  Administered 2022-04-05: 4 mg via INTRAVENOUS

## 2022-04-05 MED ORDER — MIDAZOLAM HCL 2 MG/2ML IJ SOLN
INTRAMUSCULAR | Status: DC | PRN
  Administered 2022-04-05: 2 mg via INTRAVENOUS

## 2022-04-05 MED ORDER — ACETAMINOPHEN 10 MG/ML IV SOLN: 1000.0000 mg | Freq: Once | INTRAVENOUS | Status: DC | PRN

## 2022-04-05 MED ORDER — LACTATED RINGERS IV SOLN: INTRAVENOUS | Status: DC

## 2022-04-05 MED ORDER — OXYCODONE HCL 5 MG/5ML PO SOLN: 5.0000 mg | Freq: Once | ORAL | Status: DC | PRN

## 2022-04-05 MED ORDER — 0.9 % SODIUM CHLORIDE (POUR BTL) OPTIME
TOPICAL | Status: DC | PRN
  Administered 2022-04-05: 150 mL

## 2022-04-05 MED ORDER — PROPOFOL 10 MG/ML IV BOLUS
INTRAVENOUS | Status: DC | PRN
  Administered 2022-04-05: 200 mg via INTRAVENOUS

## 2022-04-05 MED ORDER — PROPOFOL 10 MG/ML IV BOLUS
INTRAVENOUS | Status: AC
Start: 2022-04-05 — End: ?
  Filled 2022-04-05: qty 20

## 2022-04-05 SURGICAL SUPPLY — 44 items
APL PRP STRL LF DISP 70% ISPRP (MISCELLANEOUS) ×1
BLADE SURG 15 STRL LF DISP TIS (BLADE) ×1 IMPLANT
BLADE SURG 15 STRL SS (BLADE) ×2
BNDG CMPR 9X4 STRL LF SNTH (GAUZE/BANDAGES/DRESSINGS) ×1
BNDG ELASTIC 3X5.8 VLCR STR LF (GAUZE/BANDAGES/DRESSINGS) ×2 IMPLANT
BNDG ESMARK 4X9 LF (GAUZE/BANDAGES/DRESSINGS) ×2 IMPLANT
BNDG GAUZE DERMACEA FLUFF (GAUZE/BANDAGES/DRESSINGS) ×1
BNDG GAUZE DERMACEA FLUFF 4 (GAUZE/BANDAGES/DRESSINGS) ×1 IMPLANT
BNDG GZE DERMACEA 4 6PLY (GAUZE/BANDAGES/DRESSINGS) ×1
BNDG PLASTER X FAST 3X3 WHT LF (CAST SUPPLIES) IMPLANT
BNDG PLSTR 9X3 FST ST WHT (CAST SUPPLIES)
CHLORAPREP W/TINT 26 (MISCELLANEOUS) ×2 IMPLANT
CORD BIPOLAR FORCEPS 12FT (ELECTRODE) ×2 IMPLANT
COVER BACK TABLE 60X90IN (DRAPES) ×2 IMPLANT
COVER MAYO STAND STRL (DRAPES) ×2 IMPLANT
CUFF TOURN SGL QUICK 18X4 (TOURNIQUET CUFF) ×1 IMPLANT
CUFF TOURN SGL QUICK 24 (TOURNIQUET CUFF)
CUFF TRNQT CYL 24X4X16.5-23 (TOURNIQUET CUFF) IMPLANT
DRAPE EXTREMITY T 121X128X90 (DISPOSABLE) ×2 IMPLANT
DRAPE SURG 17X23 STRL (DRAPES) ×2 IMPLANT
GAUZE XEROFORM 1X8 LF (GAUZE/BANDAGES/DRESSINGS) ×1 IMPLANT
GLOVE BIO SURGEON STRL SZ7 (GLOVE) ×2 IMPLANT
GLOVE BIOGEL PI IND STRL 7.0 (GLOVE) ×1 IMPLANT
GLOVE BIOGEL PI INDICATOR 7.0 (GLOVE) ×1
GOWN STRL REUS W/ TWL LRG LVL3 (GOWN DISPOSABLE) ×1 IMPLANT
GOWN STRL REUS W/TWL LRG LVL3 (GOWN DISPOSABLE) ×2
GOWN STRL REUS W/TWL XL LVL3 (GOWN DISPOSABLE) ×2 IMPLANT
NDL HYPO 25X1 1.5 SAFETY (NEEDLE) IMPLANT
NEEDLE HYPO 25X1 1.5 SAFETY (NEEDLE) ×2 IMPLANT
NS IRRIG 1000ML POUR BTL (IV SOLUTION) ×2 IMPLANT
PACK BASIN DAY SURGERY FS (CUSTOM PROCEDURE TRAY) ×2 IMPLANT
PAD CAST 3X4 CTTN HI CHSV (CAST SUPPLIES) ×1 IMPLANT
PADDING CAST COTTON 3X4 STRL (CAST SUPPLIES) ×2
SLEEVE SCD COMPRESS KNEE MED (STOCKING) ×1 IMPLANT
SUCTION FRAZIER HANDLE 10FR (MISCELLANEOUS)
SUCTION TUBE FRAZIER 10FR DISP (MISCELLANEOUS) IMPLANT
SUT ETHILON 4 0 PS 2 18 (SUTURE) ×2 IMPLANT
SUT MNCRL AB 3-0 PS2 18 (SUTURE) IMPLANT
SUT VICRYL 4-0 PS2 18IN ABS (SUTURE) IMPLANT
SYR BULB EAR ULCER 3OZ GRN STR (SYRINGE) ×2 IMPLANT
SYR CONTROL 10ML LL (SYRINGE) ×1 IMPLANT
TOWEL GREEN STERILE FF (TOWEL DISPOSABLE) ×4 IMPLANT
TUBE CONNECTING 20X1/4 (TUBING) IMPLANT
UNDERPAD 30X36 HEAVY ABSORB (UNDERPADS AND DIAPERS) ×2 IMPLANT

## 2022-04-05 NOTE — Discharge Instructions (Addendum)
Waylan Rocher, M.D. Hand Surgery  POST-OPERATIVE DISCHARGE INSTRUCTIONS   PRESCRIPTIONS: - You have been given a prescription to be taken as directed for post-operative pain control.  You may also take over the counter ibuprofen/aleve and tylenol for pain. Take this as directed on the packaging. Do not exceed 3000 mg tylenol/acetaminophen in 24 hours.  Ibuprofen 600-800 mg (3-4) tablets by mouth every 6 hours as needed for pain.   OR  Aleve 2 tablets by mouth every 12 hours (twice daily) as needed for pain.   AND/OR  Tylenol 1000 mg (2 tablets) every 8 hours as needed for pain.  - Please use your pain medication carefully, as refills are limited and you may not be provided with one.  As stated above, please use over the counter pain medicine - it will also be helpful with decreasing your swelling.    ANESTHESIA: -After your surgery, post-surgical discomfort or pain is likely. This discomfort can last several days to a few weeks. At certain times of the day your discomfort may be more intense.   General Anesthesia:  If you did not receive a nerve block during your surgery, you will need to start taking your pain medication shortly after your surgery and should continue to do so as prescribed by your surgeon.     ICE AND ELEVATION: - You may use ice for the first 48-72 hours, but it is not critical.   - Motion of your fingers is very important to decrease the swelling.  - Elevation, as much as possible for the next 48 hours, is critical for decreasing swelling as well as for pain relief. Elevation means when you are seated or lying down, you hand should be at or above your heart. When walking, the hand needs to be at or above the level of your elbow.  - If the bandage gets too tight, it may need to be loosened. Please contact our office and we will instruct you in how to do this.    SURGICAL BANDAGES:  - Keep your dressing and/or splint clean and dry at all times.  You can  remove your dressing 4 days from now and change with a dry dressing or Band-Aids as needed thereafter. - You may place a plastic bag over your bandage to shower, but be careful, do not get your bandages wet.  - After the bandages have been removed, it is OK to get the stitches wet in a shower or with hand washing. Do Not soak or submerge the wound yet. Please do not use lotions or creams on the stitches.      HAND THERAPY:  - You may not need any. If you do, we will begin this at your follow up visit in the clinic.    ACTIVITY AND WORK: - You are encouraged to move any fingers which are not in the bandage.  - Light use of the fingers is allowed to assist the other hand with daily hygiene and eating, but strong gripping or lifting is often uncomfortable and should be avoided.  - You might miss a variable period of time from work and hopefully this issue has been discussed prior to surgery. You may not do any heavy work with your affected hand for about 2 weeks.    [facility] [address] [place],  [place]  [address] [phone]   No Ibuprofen/NSAIDS before 4pm today   Post Anesthesia Home Care Instructions  Activity: Get plenty of rest for the  remainder of the day. A responsible individual must stay with you for 24 hours following the procedure.  For the next 24 hours, DO NOT: -Drive a car -Advertising copywriter -Drink alcoholic beverages -Take any medication unless instructed by your physician -Make any legal decisions or sign important papers.  Meals: Start with liquid foods such as gelatin or soup. Progress to regular foods as tolerated. Avoid greasy, spicy, heavy foods. If nausea and/or vomiting occur, drink only clear liquids until the nausea and/or vomiting subsides. Call your physician if vomiting continues.  Special Instructions/Symptoms: Your throat may feel dry or sore from the anesthesia or the breathing tube placed in your throat during surgery.  If this causes discomfort, gargle with warm salt water. The discomfort should disappear within 24 hours.  If you had a scopolamine patch placed behind your ear for the management of post- operative nausea and/or vomiting:  1. The medication in the patch is effective for 72 hours, after which it should be removed.  Wrap patch in a tissue and discard in the trash. Wash hands thoroughly with soap and water. 2. You may remove the patch earlier than 72 hours if you experience unpleasant side effects which may include dry mouth, dizziness or visual disturbances. 3. Avoid touching the patch. Wash your hands with soap and water after contact with the patch.

## 2022-04-05 NOTE — Transfer of Care (Signed)
Immediate Anesthesia Transfer of Care Note  Patient: Rodney Mcclain  Procedure(s) Performed: LEFT CARPAL TUNNEL RELEASE (Left: Wrist)  Patient Location: PACU  Anesthesia Type:General  Level of Consciousness: awake, alert  and oriented  Airway & Oxygen Therapy: Patient Spontanous Breathing and Patient connected to face mask oxygen  Post-op Assessment: Report given to RN and Post -op Vital signs reviewed and stable  Post vital signs: Reviewed and stable  Last Vitals:  Vitals Value Taken Time  BP    Temp    Pulse 61 04/05/22 0815  Resp 15 04/05/22 0815  SpO2 100 % 04/05/22 0815  Vitals shown include unvalidated device data.  Last Pain:  Vitals:   04/05/22 0702  TempSrc: Oral  PainSc: 0-No pain         Complications: No notable events documented.

## 2022-04-05 NOTE — Op Note (Signed)
   Date of Surgery: 04/05/2022  INDICATIONS: Patient is a 46 y.o.-year-old male with left carpal tunnel syndrome that was confirmed on electrodiagnostic studies and has failed conservative management.  Risks, benefits, and alternatives to surgery were again discussed with the patient in the preoperative area. The patient wishes to proceed with surgery.  Informed consent was signed after our discussion.   PREOPERATIVE DIAGNOSIS:  Left carpal tunnel syndrome  POSTOPERATIVE DIAGNOSIS: Same.  PROCEDURE:  Left carpal tunnel release   SURGEON: Charles Benfield, M.D.  ASSIST:   ANESTHESIA:  General  IV FLUIDS AND URINE: See anesthesia.  ESTIMATED BLOOD LOSS: <5 mL.  IMPLANTS: * No implants in log *   DRAINS: None  COMPLICATIONS: None  DESCRIPTION OF PROCEDURE: The patient was met in the preoperative holding area where the surgical site was marked and the consent form was verified.  The patient was then taken to the operating room and transferred to the operating table.  All bony prominences were well padded.  A tourniquet was applied to the left upper arm.  General endotracheal anesthesia was induced.  The operative extremity was prepped and draped in the usual and sterile fashion.  A formal time-out was performed to confirm that this was the correct patient, surgery, side, and site.    Following a second timeout, the limb was exsanguinated and the tourniquet inflated to 250 mmHg.  A longitudinal incision was made in line with the radial border of the ring finger from distal to the wrist flexion crease to the intersection of Kaplan's cardinal line.  The skin and subcutaneous tissue was sharply divided.  The longitudinally running palmar fascia was incised.  The ligament was divided from proximal to distal until the fat surrounding the palmar arch was encountered. Retractors were then placed in the proximal aspect of the wound to visualize the distal antebrachial fascia.  The fascia was  sharply divided under direct visualization.   The wound was then thoroughly irrigated with sterile saline.  The tourniquet was deflated.  Hemostasis was achieved with direct pressure and bipolar electrocautery.  The wound was then closed with 4-0 nylon sutures in a horizontal mattress fashion. The wound was then dressed with xeroform, folded kerlix, and an ace wrap.  The patient was then reversed from anesthesia and transferred to the postoperative bed.  All counts were correct x 2 at the end of the procedure.  The patient was taken to the recovery unit in stable condition.      POSTOPERATIVE PLAN: He will be discharged to home with appropriate pain medication and discharge instructions.  I'll see him back in 10-14 days for his first postop visit.   Charles Benfield, MD 8:11 AM  

## 2022-04-05 NOTE — Anesthesia Postprocedure Evaluation (Signed)
Anesthesia Post Note  Patient: Rodney Mcclain  Procedure(s) Performed: LEFT CARPAL TUNNEL RELEASE (Left: Wrist)     Patient location during evaluation: PACU Anesthesia Type: General Level of consciousness: awake and alert Pain management: pain level controlled Vital Signs Assessment: post-procedure vital signs reviewed and stable Respiratory status: spontaneous breathing, nonlabored ventilation, respiratory function stable and patient connected to nasal cannula oxygen Cardiovascular status: blood pressure returned to baseline and stable Postop Assessment: no apparent nausea or vomiting Anesthetic complications: no   No notable events documented.  Last Vitals:  Vitals:   04/05/22 0830 04/05/22 0902  BP: 118/71 121/79  Pulse: (!) 56 (!) 56  Resp: 14 16  Temp:  36.6 C  SpO2: 95% 96%    Last Pain:  Vitals:   04/05/22 0902  TempSrc: Oral  PainSc: 0-No pain                 [name]

## 2022-04-05 NOTE — Anesthesia Preprocedure Evaluation (Addendum)
Anesthesia Evaluation  Patient identified by MRN, date of birth, ID band Patient awake    Reviewed: Allergy & Precautions, NPO status , Patient's Chart, lab work & pertinent test results  History of Anesthesia Complications Negative for: history of anesthetic complications  Airway Mallampati: II  TM Distance: <3 FB Neck ROM: Full    Dental  (+) Dental Advisory Given, Teeth Intact   Pulmonary neg shortness of breath, sleep apnea , neg COPD, neg recent URI, former smoker,    breath sounds clear to auscultation       Cardiovascular hypertension, Pt. on medications (-) angina(-) Past MI and (-) CHF  Rhythm:Regular     Neuro/Psych PSYCHIATRIC DISORDERS Anxiety Depression  Neuromuscular disease    GI/Hepatic negative GI ROS, Neg liver ROS,   Endo/Other  Hypothyroidism Morbid obesity  Renal/GU negative Renal ROS     Musculoskeletal LEFT CARPAL TUNNEL SYNDROME   Abdominal   Peds  Hematology negative hematology ROS (+)   Anesthesia Other Findings   Reproductive/Obstetrics                            Anesthesia Physical Anesthesia Plan  ASA: 3  Anesthesia Plan: General   Post-op Pain Management: Ofirmev IV (intra-op)* and Toradol IV (intra-op)*   Induction: Intravenous  PONV Risk Score and Plan: 2 and Ondansetron and Dexamethasone  Airway Management Planned: LMA  Additional Equipment: None  Intra-op Plan:   Post-operative Plan: Extubation in OR  Informed Consent: I have reviewed the patients History and Physical, chart, labs and discussed the procedure including the risks, benefits and alternatives for the proposed anesthesia with the patient or authorized representative who has indicated his/her understanding and acceptance.     Dental advisory given  Plan Discussed with: CRNA  Anesthesia Plan Comments:        Anesthesia Quick Evaluation

## 2022-04-05 NOTE — Interval H&P Note (Signed)
History and Physical Interval Note:  04/05/2022 7:26 AM  Rodney Mcclain  has presented today for surgery, with the diagnosis of LEFT CARPAL TUNNEL SYNDROME.  The various methods of treatment have been discussed with the patient and family. After consideration of risks, benefits and other options for treatment, the patient has consented to  Procedure(s): LEFT CARPAL TUNNEL RELEASE (Left) as a surgical intervention.  The patient's history has been reviewed, patient examined, no change in status, stable for surgery.  I have reviewed the patient's chart and labs.  Questions were answered to the patient's satisfaction.     [name]

## 2022-04-05 NOTE — Anesthesia Procedure Notes (Signed)
Procedure Name: LMA Insertion Date/Time: 04/05/2022 7:51 AM  Performed by: Karen Kitchens, CRNAPre-anesthesia Checklist: Patient identified, Emergency Drugs available, Suction available and Patient being monitored Patient Re-evaluated:Patient Re-evaluated prior to induction Oxygen Delivery Method: Circle system utilized Preoxygenation: Pre-oxygenation with 100% oxygen Induction Type: IV induction Ventilation: Mask ventilation without difficulty LMA: LMA inserted LMA Size: 5.0 Number of attempts: 1 Airway Equipment and Method: Bite block Placement Confirmation: positive ETCO2, breath sounds checked- equal and bilateral and CO2 detector Tube secured with: Tape Dental Injury: Teeth and Oropharynx as per pre-operative assessment

## 2022-04-06 ENCOUNTER — Encounter (HOSPITAL_BASED_OUTPATIENT_CLINIC_OR_DEPARTMENT_OTHER): Payer: Self-pay | Admitting: Orthopedic Surgery

## 2022-04-09 ENCOUNTER — Other Ambulatory Visit: Payer: Self-pay | Admitting: Family Medicine

## 2022-04-17 ENCOUNTER — Ambulatory Visit (INDEPENDENT_AMBULATORY_CARE_PROVIDER_SITE_OTHER): Payer: BC Managed Care – PPO | Admitting: Orthopedic Surgery

## 2022-04-17 DIAGNOSIS — G5602 Carpal tunnel syndrome, left upper limb: Secondary | ICD-10-CM

## 2022-04-17 NOTE — Progress Notes (Signed)
Post-Op Visit Note   Patient: Rodney Mcclain           Date of Birth: 1975-11-04           MRN: [id number] Visit Date: 04/17/2022 PCP: Sheliah Hatch, MD   Assessment & Plan:  Chief Complaint:  Chief Complaint  Patient presents with   Left Hand - Routine Post Op    Carpal Tunnel Release   Visit Diagnoses:  1. Carpal tunnel syndrome, left upper limb     Plan: Patient is just under 2-week status post left carpal tunnel release.  He is doing well today.  His nocturnal symptoms have resolved.  The pain in the hand is also resolved.  He still some residual numbness in his fingers but notes that it is improved from previous.  His incision is clean, dry, well approximated.  The nylon sutures were removed.  He does continue to have symptoms involving the right hand but is involved in a large company move to a different building and would like to wait for surgery until this is completed.  He can see me back again when he is ready to discuss the right side.  Follow-Up Instructions: No follow-ups on file.   Orders:  No orders of the defined types were placed in this encounter.  No orders of the defined types were placed in this encounter.   Imaging: No results found.  PMFS History: Patient Active Problem List   Diagnosis Date Noted   Carpal tunnel syndrome, left upper limb    Bilateral carpal tunnel syndrome 03/17/2022   Bilateral hand numbness 02/28/2022   Acquired trigger finger of left little finger 02/28/2022   Sensorineural hearing loss (SNHL) of right ear with unrestricted hearing of left ear 11/16/2021   Vestibular hypofunction of right ear 11/16/2021   Asymmetric SNHL (sensorineural hearing loss) 12/29/2020   PE (pulmonary thromboembolism) (HCC) 06/29/2020   COVID-19 virus infection 06/20/2020   Hypothyroid 10/31/2019   Snoring 01/12/2018   Dyslipidemia 12/31/2017   Vertigo 03/29/2017   Gout flare 01/19/2014   General medical examination 03/03/2011   Morbid  obesity (HCC) 01/31/2011   Anxiety and depression 01/31/2011   ANAL FISSURE 06/17/2009   ALLERGIC RHINITIS DUE TO POLLEN 01/14/2009   Essential hypertension 12/14/2008   BACK PAIN, THORACIC REGION 12/14/2008   Past Medical History:  Diagnosis Date   Anal fissure    Anxiety and depression    Depression    Diverticulitis    Gout    Hypertension    Obesity    OSA on CPAP     Family History  Problem Relation Age of Onset   Arthritis Mother    Heart disease Mother    Hypertension Mother    Diabetes Mother    Sleep apnea Mother    Arthritis Father    Kidney cancer Father    Arthritis Maternal Grandfather    Stroke Maternal Grandfather    Lung cancer Other        aunt   Hypertension Other        all grandparents   Diabetes Maternal Grandmother    Sleep apnea Sister    Sleep apnea Brother    Colon cancer Neg Hx    Colon polyps Neg Hx    Esophageal cancer Neg Hx    Gallbladder disease Neg Hx     Past Surgical History:  Procedure Laterality Date   CARPAL TUNNEL RELEASE Left 04/05/2022   Procedure: LEFT CARPAL TUNNEL RELEASE;  Surgeon:  [name], MD;  Location: [facility];  Service: Orthopedics;  Laterality: Left;   HIP SURGERY  1986 & 1991   bilateral pin placement   TOE SURGERY     bilateral for bone spurs   Social History   Occupational History   Occupation: Librarian, academic  Tobacco Use   Smoking status: Former    Packs/day: 1.50    Years: 18.00    Total pack years: 27.00    Types: Cigarettes    Quit date: 10/09/2005    Years since quitting: 16.5   Smokeless tobacco: Never  Vaping Use   Vaping Use: Never used  Substance and Sexual Activity   Alcohol use: Yes    Alcohol/week: 0.0 standard drinks of alcohol    Comment: occ.   Drug use: No   Sexual activity: Not on file

## 2022-05-13 ENCOUNTER — Other Ambulatory Visit: Payer: Self-pay | Admitting: Family Medicine

## 2022-05-15 ENCOUNTER — Telehealth: Payer: Self-pay | Admitting: Orthopedic Surgery

## 2022-05-15 ENCOUNTER — Other Ambulatory Visit: Payer: Self-pay | Admitting: Family Medicine

## 2022-05-15 NOTE — Telephone Encounter (Addendum)
Patient left a message stating he is ready to get on the books for a right carpal tunnel release. Patient had left CTR on 04/05/22. Please fill out a surgery sheet, if okay doing surgery.

## 2022-05-16 ENCOUNTER — Ambulatory Visit (INDEPENDENT_AMBULATORY_CARE_PROVIDER_SITE_OTHER): Payer: BC Managed Care – PPO | Admitting: Family Medicine

## 2022-05-16 ENCOUNTER — Encounter: Payer: Self-pay | Admitting: Family Medicine

## 2022-05-16 VITALS — BP 130/80 | HR 71 | Temp 97.9°F | Resp 16 | Ht 71.0 in | Wt 306.1 lb

## 2022-05-16 DIAGNOSIS — E785 Hyperlipidemia, unspecified: Secondary | ICD-10-CM | POA: Diagnosis not present

## 2022-05-16 DIAGNOSIS — E039 Hypothyroidism, unspecified: Secondary | ICD-10-CM

## 2022-05-16 DIAGNOSIS — I10 Essential (primary) hypertension: Secondary | ICD-10-CM

## 2022-05-16 LAB — CBC WITH DIFFERENTIAL/PLATELET
Basophils Absolute: 0.1 10*3/uL (ref 0.0–0.1)
Basophils Relative: 1.1 % (ref 0.0–3.0)
Eosinophils Absolute: 0.3 10*3/uL (ref 0.0–0.7)
Eosinophils Relative: 5.3 % — ABNORMAL HIGH (ref 0.0–5.0)
HCT: 43.8 % (ref 39.0–52.0)
Hemoglobin: 14.9 g/dL (ref 13.0–17.0)
Lymphocytes Relative: 20.8 % (ref 12.0–46.0)
Lymphs Abs: 1.3 10*3/uL (ref 0.7–4.0)
MCHC: 34.1 g/dL (ref 30.0–36.0)
MCV: 96.6 fl (ref 78.0–100.0)
Monocytes Absolute: 0.5 10*3/uL (ref 0.1–1.0)
Monocytes Relative: 7.8 % (ref 3.0–12.0)
Neutro Abs: 4.2 10*3/uL (ref 1.4–7.7)
Neutrophils Relative %: 65 % (ref 43.0–77.0)
Platelets: 201 10*3/uL (ref 150.0–400.0)
RBC: 4.53 Mil/uL (ref 4.22–5.81)
RDW: 13.7 % (ref 11.5–15.5)
WBC: 6.5 10*3/uL (ref 4.0–10.5)

## 2022-05-16 LAB — LIPID PANEL
Cholesterol: 196 mg/dL (ref 0–200)
HDL: 74.7 mg/dL (ref >39.00)
LDL Cholesterol: 106 mg/dL — ABNORMAL HIGH (ref 0–99)
NonHDL: 121.22
Total CHOL/HDL Ratio: 3
Triglycerides: 76 mg/dL (ref 0.0–149.0)
VLDL: 15.2 mg/dL (ref 0.0–40.0)

## 2022-05-16 LAB — BASIC METABOLIC PANEL
BUN: 22 mg/dL (ref 6–23)
CO2: 30 mEq/L (ref 19–32)
Calcium: 9.7 mg/dL (ref 8.4–10.5)
Chloride: 99 mEq/L (ref 96–112)
Creatinine, Ser: 1.13 mg/dL (ref 0.40–1.50)
GFR: 76.96 mL/min (ref >60.00)
Glucose, Bld: 92 mg/dL (ref 70–99)
Potassium: 4.7 mEq/L (ref 3.5–5.1)
Sodium: 140 mEq/L (ref 135–145)

## 2022-05-16 LAB — TSH: TSH: 7.75 u[IU]/mL — ABNORMAL HIGH (ref 0.35–5.50)

## 2022-05-16 LAB — HEPATIC FUNCTION PANEL
ALT: 11 U/L (ref 0–53)
AST: 17 U/L (ref 0–37)
Albumin: 4.5 g/dL (ref 3.5–5.2)
Alkaline Phosphatase: 63 U/L (ref 39–117)
Bilirubin, Direct: 0.1 mg/dL (ref 0.0–0.3)
Total Bilirubin: 0.5 mg/dL (ref 0.2–1.2)
Total Protein: 7.3 g/dL (ref 6.0–8.3)

## 2022-05-16 MED ORDER — LEVOTHYROXINE SODIUM 112 MCG PO TABS
112.0000 ug | ORAL_TABLET | Freq: Every day | ORAL | 0 refills | Status: DC
Start: 2022-05-16 — End: 2022-09-27

## 2022-05-16 MED ORDER — TRIAMTERENE-HCTZ 37.5-25 MG PO CAPS
1.0000 | ORAL_CAPSULE | Freq: Every day | ORAL | 1 refills | Status: AC
Start: 2022-05-16 — End: ?

## 2022-05-16 MED ORDER — VALSARTAN 80 MG PO TABS: 80.0000 mg | ORAL_TABLET | Freq: Every day | ORAL | 1 refills | Status: DC

## 2022-05-16 MED ORDER — SIMVASTATIN 40 MG PO TABS: 40.0000 mg | ORAL_TABLET | Freq: Every day | ORAL | 1 refills | Status: DC

## 2022-05-16 NOTE — Assessment & Plan Note (Signed)
Chronic problem, on Levothyroxine daily.  Some fatigue due to poor sleep.  Check labs.  Adjust meds prn

## 2022-05-16 NOTE — Assessment & Plan Note (Signed)
Chronic problem, on Simvastatin 40mg daily w/o difficulty.  Check labs.  Adjust meds prn  

## 2022-05-16 NOTE — Assessment & Plan Note (Signed)
Chronic problem.  Currently adequate control on Valsartan 80mg  daily, Triamterene HCTZ 37.5/25mg  daily.  Asymptomatic.  Check labs due to ARB and diuretics but no anticipated med changes.  Will follow.

## 2022-05-16 NOTE — Patient Instructions (Signed)
Schedule your complete physical in 6 months We'll notify you of your lab results and make any changes if needed Continue to work on healthy diet and regular exercise- you're doing great!! Call with any questions or concerns Good luck w/ the wrist!!!

## 2022-05-16 NOTE — Assessment & Plan Note (Signed)
Pt is down 13 lbs since last visit.  Applauded his efforts at better diet and eliminating snacking.  Will continue to follow.

## 2022-05-16 NOTE — Progress Notes (Signed)
   Subjective:    Patient ID: Rodney Mcclain, male    DOB: 1976-08-11, 46 y.o.   MRN: [id number]  HPI HTN- chronic problem, on Valsartan 80mg  daily, Triamterene HCTZ 37.5/25mg  daily w/ adequate control.  Denies CP, SOB, HAs, visual changes, edema.  Hyperlipidemia- chronic problem, on Simvastatin 40mg  daily.  No abd pain, N/V.  Hypothyroid- chronic problem, on Levothyroxine daily.  Some decreased energy but pt is not sleeping well due to carpal tunnel  Obesity- pt is down 13 lbs since last visit.  Pt was down lower but went on recent cruise and gained 17 lbs.  He is working on cutting back- 3 meals/day no snacks.     Review of Systems For ROS see HPI     Objective:   Physical Exam Vitals reviewed.  Constitutional:      General: He is not in acute distress.    Appearance: Normal appearance. He is well-developed. He is obese. He is not ill-appearing.  HENT:     Head: Normocephalic and atraumatic.  Eyes:     Extraocular Movements: Extraocular movements intact.     Conjunctiva/sclera: Conjunctivae normal.     Pupils: Pupils are equal, round, and reactive to light.  Neck:     Thyroid: No thyromegaly.  Cardiovascular:     Rate and Rhythm: Normal rate and regular rhythm.     Pulses: Normal pulses.     Heart sounds: Normal heart sounds. No murmur heard. Pulmonary:     Effort: Pulmonary effort is normal. No respiratory distress.     Breath sounds: Normal breath sounds.  Abdominal:     General: Bowel sounds are normal. There is no distension.     Palpations: Abdomen is soft.  Musculoskeletal:     Cervical back: Normal range of motion and neck supple.     Right lower leg: No edema.     Left lower leg: No edema.  Lymphadenopathy:     Cervical: No cervical adenopathy.  Skin:    General: Skin is warm and dry.  Neurological:     General: No focal deficit present.     Mental Status: He is alert and oriented to person, place, and time.     Cranial Nerves: No cranial nerve  deficit.  Psychiatric:        Mood and Affect: Mood normal.        Behavior: Behavior normal.           Assessment & Plan:

## 2022-05-22 ENCOUNTER — Other Ambulatory Visit: Payer: Self-pay

## 2022-05-22 ENCOUNTER — Telehealth: Payer: Self-pay | Admitting: Family Medicine

## 2022-05-22 ENCOUNTER — Encounter (HOSPITAL_BASED_OUTPATIENT_CLINIC_OR_DEPARTMENT_OTHER): Payer: Self-pay | Admitting: Orthopedic Surgery

## 2022-05-22 DIAGNOSIS — E039 Hypothyroidism, unspecified: Secondary | ICD-10-CM

## 2022-05-22 MED ORDER — LEVOTHYROXINE SODIUM 50 MCG PO TABS: 50.0000 ug | ORAL_TABLET | Freq: Every day | ORAL | 3 refills | Status: DC

## 2022-05-22 NOTE — Telephone Encounter (Signed)
Spoke w/ pt and advised we have sent in the Levothyroxine 50 mcg to [facility] again . He will let us know if they do not get the RX

## 2022-05-22 NOTE — Telephone Encounter (Signed)
Caller name: Tylique Aull   On DPR? :yes/no: Yes  Call back number:[phone]  Provider they see: Beverely Low   Reason for call: Pt called stating that he trouble receiving his medication. Pt states that his pharmacy don't have his medication.

## 2022-05-22 NOTE — Telephone Encounter (Signed)
Pt just returned your phone call. Pt said he just want his medication sent in.

## 2022-05-22 NOTE — Telephone Encounter (Signed)
Caller name: Rodney Mcclain (pt)  On DPR? :yes/no: Yes  Call back number: [phone]  Provider they see: Beverely Low  Reason for call:  States that he is returning call from [place] re: new medication.

## 2022-05-22 NOTE — Telephone Encounter (Signed)
Attempted to call patient again LVM.

## 2022-05-26 NOTE — Progress Notes (Signed)
Left message for Dr. Georgia Duff office for orders

## 2022-05-29 ENCOUNTER — Ambulatory Visit (HOSPITAL_BASED_OUTPATIENT_CLINIC_OR_DEPARTMENT_OTHER): Payer: BC Managed Care – PPO | Admitting: Anesthesiology

## 2022-05-29 ENCOUNTER — Encounter (HOSPITAL_BASED_OUTPATIENT_CLINIC_OR_DEPARTMENT_OTHER): Payer: Self-pay | Admitting: Orthopedic Surgery

## 2022-05-29 ENCOUNTER — Encounter (HOSPITAL_BASED_OUTPATIENT_CLINIC_OR_DEPARTMENT_OTHER)
Admission: RE | Disposition: A | Discharge status: Home / Self Care | Payer: Self-pay | Source: Home / Self Care | Attending: Orthopedic Surgery

## 2022-05-29 ENCOUNTER — Other Ambulatory Visit: Payer: Self-pay

## 2022-05-29 ENCOUNTER — Ambulatory Visit (HOSPITAL_BASED_OUTPATIENT_CLINIC_OR_DEPARTMENT_OTHER)
Admission: RE | Admit: 2022-05-29 | Discharge: 2022-05-29 | Disposition: A | Discharge status: Home / Self Care | Payer: BC Managed Care – PPO | Attending: Orthopedic Surgery | Admitting: Orthopedic Surgery

## 2022-05-29 DIAGNOSIS — E039 Hypothyroidism, unspecified: Secondary | ICD-10-CM | POA: Diagnosis not present

## 2022-05-29 DIAGNOSIS — I10 Essential (primary) hypertension: Secondary | ICD-10-CM | POA: Diagnosis not present

## 2022-05-29 DIAGNOSIS — G5601 Carpal tunnel syndrome, right upper limb: Secondary | ICD-10-CM

## 2022-05-29 DIAGNOSIS — Z87891 Personal history of nicotine dependence: Secondary | ICD-10-CM | POA: Diagnosis not present

## 2022-05-29 DIAGNOSIS — F418 Other specified anxiety disorders: Secondary | ICD-10-CM | POA: Diagnosis not present

## 2022-05-29 HISTORY — PX: CARPAL TUNNEL RELEASE: SHX101

## 2022-05-29 SURGERY — CARPAL TUNNEL RELEASE
Anesthesia: Monitor Anesthesia Care | Site: Wrist | Laterality: Right

## 2022-05-29 MED ORDER — MIDAZOLAM HCL 2 MG/2ML IJ SOLN
INTRAMUSCULAR | Status: AC
  Filled 2022-05-29: qty 2

## 2022-05-29 MED ORDER — PROPOFOL 10 MG/ML IV BOLUS
INTRAVENOUS | Status: DC | PRN
  Administered 2022-05-29: 30 mg via INTRAVENOUS
  Administered 2022-05-29 (×2): 20 mg via INTRAVENOUS
  Administered 2022-05-29: 30 mg via INTRAVENOUS

## 2022-05-29 MED ORDER — OXYCODONE HCL 5 MG/5ML PO SOLN: 5.0000 mg | Freq: Once | ORAL | Status: DC | PRN

## 2022-05-29 MED ORDER — LACTATED RINGERS IV SOLN: INTRAVENOUS | Status: DC

## 2022-05-29 MED ORDER — BUPIVACAINE HCL 0.25 % IJ SOLN
INTRAMUSCULAR | Status: DC | PRN
  Administered 2022-05-29: 10 mL

## 2022-05-29 MED ORDER — FENTANYL CITRATE (PF) 100 MCG/2ML IJ SOLN
INTRAMUSCULAR | Status: DC | PRN
Start: 2022-05-29 — End: 2022-05-29
  Administered 2022-05-29: 50 ug via INTRAVENOUS

## 2022-05-29 MED ORDER — PROMETHAZINE HCL 25 MG/ML IJ SOLN: 6.2500 mg | INTRAMUSCULAR | Status: DC | PRN

## 2022-05-29 MED ORDER — MIDAZOLAM HCL 5 MG/5ML IJ SOLN
INTRAMUSCULAR | Status: DC | PRN
  Administered 2022-05-29: 2 mg via INTRAVENOUS

## 2022-05-29 MED ORDER — ONDANSETRON HCL 4 MG/2ML IJ SOLN
INTRAMUSCULAR | Status: AC
  Filled 2022-05-29: qty 2

## 2022-05-29 MED ORDER — PROPOFOL 500 MG/50ML IV EMUL
INTRAVENOUS | Status: AC
  Filled 2022-05-29: qty 50

## 2022-05-29 MED ORDER — FENTANYL CITRATE (PF) 100 MCG/2ML IJ SOLN
INTRAMUSCULAR | Status: AC
  Filled 2022-05-29: qty 2

## 2022-05-29 MED ORDER — 0.9 % SODIUM CHLORIDE (POUR BTL) OPTIME
TOPICAL | Status: DC | PRN
  Administered 2022-05-29: 50 mL

## 2022-05-29 MED ORDER — HYDROMORPHONE HCL 1 MG/ML IJ SOLN: 0.2500 mg | INTRAMUSCULAR | Status: DC | PRN

## 2022-05-29 MED ORDER — ONDANSETRON HCL 4 MG/2ML IJ SOLN
INTRAMUSCULAR | Status: DC | PRN
  Administered 2022-05-29: 4 mg via INTRAVENOUS

## 2022-05-29 MED ORDER — OXYCODONE HCL 5 MG PO TABS: 5.0000 mg | ORAL_TABLET | Freq: Once | ORAL | Status: DC | PRN

## 2022-05-29 MED ORDER — PROPOFOL 10 MG/ML IV BOLUS
INTRAVENOUS | Status: AC
  Filled 2022-05-29: qty 20

## 2022-05-29 MED ORDER — PROPOFOL 500 MG/50ML IV EMUL
INTRAVENOUS | Status: DC | PRN
  Administered 2022-05-29: 75 ug/kg/min via INTRAVENOUS

## 2022-05-29 SURGICAL SUPPLY — 40 items
APL PRP STRL LF DISP 70% ISPRP (MISCELLANEOUS) ×1
BLADE SURG 15 STRL LF DISP TIS (BLADE) ×1 IMPLANT
BLADE SURG 15 STRL SS (BLADE) ×1
BNDG CMPR 9X4 STRL LF SNTH (GAUZE/BANDAGES/DRESSINGS) ×1
BNDG ELASTIC 3X5.8 VLCR STR LF (GAUZE/BANDAGES/DRESSINGS) ×1 IMPLANT
BNDG ESMARK 4X9 LF (GAUZE/BANDAGES/DRESSINGS) ×1 IMPLANT
BNDG GAUZE DERMACEA FLUFF (GAUZE/BANDAGES/DRESSINGS) ×1
BNDG GAUZE DERMACEA FLUFF 4 (GAUZE/BANDAGES/DRESSINGS) ×1 IMPLANT
BNDG GZE DERMACEA 4 6PLY (GAUZE/BANDAGES/DRESSINGS) ×1
BNDG PLASTER X FAST 3X3 WHT LF (CAST SUPPLIES) IMPLANT
BNDG PLSTR 9X3 FST ST WHT (CAST SUPPLIES)
CHLORAPREP W/TINT 26 (MISCELLANEOUS) ×1 IMPLANT
CORD BIPOLAR FORCEPS 12FT (ELECTRODE) ×1 IMPLANT
COVER BACK TABLE 60X90IN (DRAPES) ×1 IMPLANT
COVER MAYO STAND STRL (DRAPES) ×1 IMPLANT
CUFF TOURN SGL QUICK 18X4 (TOURNIQUET CUFF) IMPLANT
CUFF TOURN SGL QUICK 24 (TOURNIQUET CUFF)
CUFF TRNQT CYL 24X4X16.5-23 (TOURNIQUET CUFF) IMPLANT
DRAPE EXTREMITY T 121X128X90 (DISPOSABLE) ×1 IMPLANT
DRAPE SURG 17X23 STRL (DRAPES) ×1 IMPLANT
GAUZE XEROFORM 1X8 LF (GAUZE/BANDAGES/DRESSINGS) ×1 IMPLANT
GLOVE BIO SURGEON STRL SZ7 (GLOVE) ×1 IMPLANT
GLOVE BIOGEL PI IND STRL 7.0 (GLOVE) ×1 IMPLANT
GLOVE BIOGEL PI INDICATOR 7.0 (GLOVE) ×1
GOWN STRL REUS W/ TWL LRG LVL3 (GOWN DISPOSABLE) ×2 IMPLANT
GOWN STRL REUS W/TWL LRG LVL3 (GOWN DISPOSABLE) ×2
NDL HYPO 25X1 1.5 SAFETY (NEEDLE) IMPLANT
NEEDLE HYPO 25X1 1.5 SAFETY (NEEDLE) IMPLANT
NS IRRIG 1000ML POUR BTL (IV SOLUTION) ×1 IMPLANT
PACK BASIN DAY SURGERY FS (CUSTOM PROCEDURE TRAY) ×1 IMPLANT
PAD CAST 3X4 CTTN HI CHSV (CAST SUPPLIES) ×1 IMPLANT
PADDING CAST COTTON 3X4 STRL (CAST SUPPLIES) ×1
SLEEVE SCD COMPRESS KNEE MED (STOCKING) IMPLANT
SUT ETHILON 4 0 PS 2 18 (SUTURE) ×1 IMPLANT
SUT MNCRL AB 3-0 PS2 18 (SUTURE) IMPLANT
SUT VICRYL 4-0 PS2 18IN ABS (SUTURE) IMPLANT
SYR BULB EAR ULCER 3OZ GRN STR (SYRINGE) ×1 IMPLANT
SYR CONTROL 10ML LL (SYRINGE) ×1 IMPLANT
TOWEL GREEN STERILE FF (TOWEL DISPOSABLE) ×2 IMPLANT
UNDERPAD 30X36 HEAVY ABSORB (UNDERPADS AND DIAPERS) ×1 IMPLANT

## 2022-05-29 NOTE — H&P (Signed)
HAND SURGERY CONSULTATION  REQUESTING PHYSICIAN: [name], MD   Chief Complaint: Right hand numbness  HPI: Rodney Mcclain is a 46 y.o. male who presents with numbness and paresthesias of the right hand.  He has right carpal tunnel syndrome that was confirmed on electrodiagnostic studies and has failed conservative management.  He presents today for R carpal tunnel release.  He denies any new medical issues or systemic symptoms.     Past Medical History:  Diagnosis Date   Anal fissure    Anxiety and depression    Depression    Diverticulitis    Gout    Hypertension    Obesity    OSA on CPAP    Past Surgical History:  Procedure Laterality Date   CARPAL TUNNEL RELEASE Left 04/05/2022   Procedure: LEFT CARPAL TUNNEL RELEASE;  Surgeon: [name], MD;  Location: [facility];  Service: Orthopedics;  Laterality: Left;   HIP SURGERY  1986 & 1991   bilateral pin placement   TOE SURGERY     bilateral for bone spurs   Social History   Socioeconomic History   Marital status: Married    Spouse name: Not on file   Number of children: 1   Years of education: Not on file   Highest education level: Not on file  Occupational History   Occupation: Librarian, academic  Tobacco Use   Smoking status: Former    Packs/day: 1.50    Years: 18.00    Total pack years: 27.00    Types: Cigarettes    Quit date: 10/09/2005    Years since quitting: 16.6   Smokeless tobacco: Never  Vaping Use   Vaping Use: Never used  Substance and Sexual Activity   Alcohol use: Yes    Alcohol/week: 0.0 standard drinks of alcohol    Comment: occ.   Drug use: No   Sexual activity: Not on file  Other Topics Concern   Not on file  Social History Narrative   Not on file   Social Determinants of Health   Financial Resource Strain: Not on file  Food Insecurity: Not on file  Transportation Needs: Not on file  Physical Activity: Not on file  Stress: Not on file   Social Connections: Not on file   Family History  Problem Relation Age of Onset   Arthritis Mother    Heart disease Mother    Hypertension Mother    Diabetes Mother    Sleep apnea Mother    Arthritis Father    Kidney cancer Father    Arthritis Maternal Grandfather    Stroke Maternal Grandfather    Lung cancer Other        aunt   Hypertension Other        all grandparents   Diabetes Maternal Grandmother    Sleep apnea Sister    Sleep apnea Brother    Colon cancer Neg Hx    Colon polyps Neg Hx    Esophageal cancer Neg Hx    Gallbladder disease Neg Hx    - negative except otherwise stated in the family history section No Known Allergies Prior to Admission medications   Medication Sig Start Date End Date Taking? Authorizing Provider  levothyroxine (EUTHYROX) 112 MCG tablet Take 1 tablet (112 mcg total) by mouth daily. 05/16/22  Yes Sheliah Hatch, MD  levothyroxine (SYNTHROID) 112 MCG tablet Take 1 tablet by mouth once daily 05/16/22  Yes Sheliah Hatch, MD  simvastatin (ZOCOR) 40 MG  tablet Take 1 tablet (40 mg total) by mouth at bedtime. 05/16/22  Yes Sheliah Hatch, MD  triamterene-hydrochlorothiazide (DYAZIDE) 37.5-25 MG capsule Take 1 each (1 capsule total) by mouth daily. 05/16/22  Yes Sheliah Hatch, MD  valsartan (DIOVAN) 80 MG tablet Take 1 tablet (80 mg total) by mouth daily. 05/16/22  Yes Sheliah Hatch, MD  levothyroxine (SYNTHROID) 50 MCG tablet Take 1 tablet (50 mcg total) by mouth daily. 05/22/22   Sheliah Hatch, MD   No results found. - Positive ROS: All other systems have been reviewed and were otherwise negative with the exception of those mentioned in the HPI and as above.  Physical Exam: General: No acute distress, resting comfortably Cardiovascular: BUE warm and well perfused, normal rate Respiratory: Normal WOB on RA Skin: Warm and dry Neurologic: Sensation intact distally Psychiatric: Patient is at baseline mood and affect  Right  Upper Extremity Positive Tinel sign Negative Durkan sign Questionable Phalen sign 4/5 thenar motor strength Hand warm and well perfused   Assessment: 46 yo M w/ right CTS presents today for R CTR.  Plan: We reviewed the risks of surgery including bleeding, infection, damage to neurovascular structures, incomplete symptom relief, need for additional procedures Patient would like to proceed Informed consent was signed  Thank you for the consult and the opportunity to see Rodney Mcclain, M.D. [facility] 1:05 PM

## 2022-05-29 NOTE — Transfer of Care (Signed)
Immediate Anesthesia Transfer of Care Note  Patient: Rodney Mcclain  Procedure(s) Performed: RIGHT CARPAL TUNNEL RELEASE (Right: Wrist)  Patient Location: PACU  Anesthesia Type:General  Level of Consciousness: drowsy, patient cooperative and responds to stimulation  Airway & Oxygen Therapy: Patient Spontanous Breathing and Patient connected to face mask oxygen  Post-op Assessment: Report given to RN and Post -op Vital signs reviewed and stable  Post vital signs: Reviewed and stable  Last Vitals:  Vitals Value Taken Time  BP    Temp    Pulse 64 05/29/22 1337  Resp 24 05/29/22 1337  SpO2 97 % 05/29/22 1337  Vitals shown include unvalidated device data.  Last Pain:  Vitals:   05/29/22 1220  TempSrc: Oral  PainSc: 0-No pain      Patients Stated Pain Goal: 4 (05/29/22 1220)  Complications: No notable events documented.

## 2022-05-29 NOTE — Anesthesia Postprocedure Evaluation (Signed)
Anesthesia Post Note  Patient: Rodney Mcclain  Procedure(s) Performed: RIGHT CARPAL TUNNEL RELEASE (Right: Wrist)     Patient location during evaluation: PACU Anesthesia Type: MAC Level of consciousness: awake and alert Pain management: pain level controlled Vital Signs Assessment: post-procedure vital signs reviewed and stable Respiratory status: spontaneous breathing, nonlabored ventilation and respiratory function stable Cardiovascular status: blood pressure returned to baseline and stable Postop Assessment: no apparent nausea or vomiting Anesthetic complications: no   No notable events documented.  Last Vitals:  Vitals:   05/29/22 1400 05/29/22 1434  BP: 135/83 (!) 117/94  Pulse: (!) 59 (!) 56  Resp: 18 18  Temp:  36.6 C  SpO2: 100% 96%    Last Pain:  Vitals:   05/29/22 1434  TempSrc:   PainSc: 0-No pain                 Lynda Rainwater

## 2022-05-29 NOTE — Anesthesia Procedure Notes (Signed)
Procedure Name: MAC Date/Time: 05/29/2022 1:07 PM  Performed by: Glory Buff, CRNAPre-anesthesia Checklist: Patient identified, Emergency Drugs available, Suction available and Patient being monitored Patient Re-evaluated:Patient Re-evaluated prior to induction Oxygen Delivery Method: Simple face mask

## 2022-05-29 NOTE — Interval H&P Note (Signed)
History and Physical Interval Note:  05/29/2022 1:08 PM  Rodney Mcclain  has presented today for surgery, with the diagnosis of RIGHT CARPAL TUNNEL SYNDROME.  The various methods of treatment have been discussed with the patient and family. After consideration of risks, benefits and other options for treatment, the patient has consented to  Procedure(s): RIGHT CARPAL TUNNEL RELEASE (Right) as a surgical intervention.  The patient's history has been reviewed, patient examined, no change in status, stable for surgery.  I have reviewed the patient's chart and labs.  Questions were answered to the patient's satisfaction.     Cosimo Schertzer Hayzlee Mcsorley

## 2022-05-29 NOTE — Discharge Instructions (Addendum)
Post Anesthesia Home Care Instructions  Activity: Get plenty of rest for the remainder of the day. A responsible individual must stay with you for 24 hours following the procedure.  For the next 24 hours, DO NOT: -Drive a car -Operate machinery -Drink alcoholic beverages -Take any medication unless instructed by your physician -Make any legal decisions or sign important papers.  Meals: Start with liquid foods such as gelatin or soup. Progress to regular foods as tolerated. Avoid greasy, spicy, heavy foods. If nausea and/or vomiting occur, drink only clear liquids until the nausea and/or vomiting subsides. Call your physician if vomiting continues.  Special Instructions/Symptoms: Your throat may feel dry or sore from the anesthesia or the breathing tube placed in your throat during surgery. If this causes discomfort, gargle with warm salt water. The discomfort should disappear within 24 hours.  If you had a scopolamine patch placed behind your ear for the management of post- operative nausea and/or vomiting:  1. The medication in the patch is effective for 72 hours, after which it should be removed.  Wrap patch in a tissue and discard in the trash. Wash hands thoroughly with soap and water. 2. You may remove the patch earlier than 72 hours if you experience unpleasant side effects which may include dry mouth, dizziness or visual disturbances. 3. Avoid touching the patch. Wash your hands with soap and water after contact with the patch.       Rodney Mcclain, M.D. Hand Surgery  POST-OPERATIVE DISCHARGE INSTRUCTIONS   PRESCRIPTIONS: - You may have been given a prescription to be taken as directed for post-operative pain control.  You may also take over the counter ibuprofen/aleve and tylenol for pain. Take this as directed on the packaging. Do not exceed 3000 mg tylenol/acetaminophen in 24 hours.  Ibuprofen 600-800 mg (3-4) tablets by mouth every 6 hours as needed for pain.    OR  Aleve 2 tablets by mouth every 12 hours (twice daily) as needed for pain.   AND/OR  Tylenol 1000 mg (2 tablets) every 8 hours as needed for pain.  - Please use your pain medication carefully, as refills are limited and you may not be provided with one.  As stated above, please use over the counter pain medicine - it will also be helpful with decreasing your swelling.    ANESTHESIA: -After your surgery, post-surgical discomfort or pain is likely. This discomfort can last several days to a few weeks. At certain times of the day your discomfort may be more intense.   Did you receive a nerve block?   - A nerve block can provide pain relief for one hour to two days after your surgery. As long as the nerve block is working, you will experience little or no sensation in the area the surgeon operated on.  - As the nerve block wears off, you will begin to experience pain or discomfort. It is very important that you begin taking your prescribed pain medication before the nerve block fully wears off. Treating your pain at the first sign of the block wearing off will ensure your pain is better controlled and more tolerable when full-sensation returns. Do not wait until the pain is intolerable, as the medicine will be less effective. It is better to treat pain in advance than to try and catch up.   General Anesthesia:  If you did not receive a nerve block during your surgery, you will need to start taking your pain medication shortly after your surgery and   should continue to do so as prescribed by your surgeon.     ICE AND ELEVATION: - You may use ice for the first 48-72 hours, but it is not critical.   - Motion of your fingers is very important to decrease the swelling.  - Elevation, as much as possible for the next 48 hours, is critical for decreasing swelling as well as for pain relief. Elevation means when you are seated or lying down, you hand should be at or above your heart. When walking,  the hand needs to be at or above the level of your elbow.  - If the bandage gets too tight, it may need to be loosened. Please contact our office and we will instruct you in how to do this.    SURGICAL BANDAGES:  - Keep your dressing and/or splint clean and dry at all times.  You can remove your dressing 4 days from now and change with a dry dressing or Band-Aids as needed thereafter. - You may place a plastic bag over your bandage to shower, but be careful, do not get your bandages wet.  - After the bandages have been removed, it is OK to get the stitches wet in a shower or with hand washing. Do Not soak or submerge the wound yet. Please do not use lotions or creams on the stitches.      HAND THERAPY:  - You may not need any. If you do, we will begin this at your follow up visit in the clinic.    ACTIVITY AND WORK: - You are encouraged to move any fingers which are not in the bandage.  - Light use of the fingers is allowed to assist the other hand with daily hygiene and eating, but strong gripping or lifting is often uncomfortable and should be avoided.  - You might miss a variable period of time from work and hopefully this issue has been discussed prior to surgery. You may not do any heavy work with your affected hand for about 2 weeks.    [facility] [address] [place],  [place]  [address] [phone]  

## 2022-05-29 NOTE — Anesthesia Preprocedure Evaluation (Addendum)
Anesthesia Evaluation  Patient identified by MRN, date of birth, ID band Patient awake    Reviewed: Allergy & Precautions, NPO status , Patient's Chart, lab work & pertinent test results  History of Anesthesia Complications Negative for: history of anesthetic complications  Airway Mallampati: II  TM Distance: <3 FB Neck ROM: Full    Dental  (+) Dental Advisory Given, Teeth Intact   Pulmonary neg shortness of breath, sleep apnea , neg COPD, neg recent URI, former smoker,    breath sounds clear to auscultation       Cardiovascular hypertension, Pt. on medications (-) angina(-) Past MI and (-) CHF  Rhythm:Regular     Neuro/Psych PSYCHIATRIC DISORDERS Anxiety Depression  Neuromuscular disease    GI/Hepatic negative GI ROS, Neg liver ROS,   Endo/Other  Hypothyroidism Morbid obesity  Renal/GU negative Renal ROS     Musculoskeletal LEFT CARPAL TUNNEL SYNDROME   Abdominal (+) + obese,   Peds  Hematology negative hematology ROS (+)   Anesthesia Other Findings   Reproductive/Obstetrics                             Anesthesia Physical  Anesthesia Plan  ASA: 3  Anesthesia Plan: MAC   Post-op Pain Management: Minimal or no pain anticipated   Induction: Intravenous  PONV Risk Score and Plan: 2 and Ondansetron, Midazolam and Treatment may vary due to age or medical condition  Airway Management Planned: Simple Face Mask  Additional Equipment: None  Intra-op Plan:   Post-operative Plan:   Informed Consent: I have reviewed the patients History and Physical, chart, labs and discussed the procedure including the risks, benefits and alternatives for the proposed anesthesia with the patient or authorized representative who has indicated his/her understanding and acceptance.     Dental advisory given  Plan Discussed with: CRNA  Anesthesia Plan Comments:         Anesthesia Quick  Evaluation

## 2022-05-29 NOTE — Op Note (Signed)
   Date of Surgery: 05/29/2022  INDICATIONS: Patient is a 46 y.o.-year-old male with right carpal tunnel syndrome that was confirmed on electrodiagnostic studies and has failed conservative management.  Risks, benefits, and alternatives to surgery were again discussed with the patient in the preoperative area. The patient wishes to proceed with surgery.  Informed consent was signed after our discussion.   PREOPERATIVE DIAGNOSIS:  Right carpal tunnel syndrome  POSTOPERATIVE DIAGNOSIS: Same.  PROCEDURE:  Right carpal tunnel release   SURGEON: Audria Nine, M.D.  ASSIST:   ANESTHESIA:  Local, MAC  IV FLUIDS AND URINE: See anesthesia.  ESTIMATED BLOOD LOSS: <5 mL.  IMPLANTS: * No implants in log *   DRAINS: None  COMPLICATIONS: None  DESCRIPTION OF PROCEDURE: The patient was met in the preoperative holding area where the surgical site was marked and the consent form was verified.  The patient was then taken to the operating room and transferred to the operating table.  All bony prominences were well padded.  A tourniquet was applied to the right forearm.  Monitored sedation was induced.   A formal time-out was performed to confirm that this was the correct patient, surgery, side, and site. Following formal timeout, a local block was performed using 0.25% plain marcaine. The operative extremity was prepped and draped in the usual and sterile fashion.    Following a second timeout, the limb was exsanguinated and the tourniquet inflated to 250 mmHg.  A longitudinal incision was made in line with the radial border of the ring finger from distal to the wrist flexion crease to the intersection of Kaplan's cardinal line.  The skin and subcutaneous tissue was sharply divided.  The longitudinally running palmar fascia was incised.  The thenar musculature was bluntly swept off of the transverse carpal ligament.  The ligament was divided from proximal to distal until the fat surrounding the  palmar arch was encountered. Retractors were then placed in the proximal aspect of the wound to visualize the distal antebrachial fascia.  The fascia was sharply divided under direct visualization.   The wound was then thoroughly irrigated with sterile saline.  The tourniquet was deflated.  Hemostasis was achieved with direct pressure and bipolar electrocautery.  The wound was then closed with 4-0 nylon sutures in a horizontal mattress fashion. The wound was then dressed with xeroform, folded kerlix, and an ace wrap.  The patient was then reversed from anesthesia and transferred to the postoperative bed.  All counts were correct x 2 at the end of the procedure.  The patient was taken to the recovery unit in stable condition.      POSTOPERATIVE PLAN: Patient will be discharged to home with appropriate pain medication and discharge instructions.  I'll see him back in the office in 10-14 days for his first postop visit.   Audria Nine, MD 1:38 PM

## 2022-05-30 ENCOUNTER — Encounter (HOSPITAL_BASED_OUTPATIENT_CLINIC_OR_DEPARTMENT_OTHER): Payer: Self-pay | Admitting: Orthopedic Surgery

## 2022-05-30 NOTE — Progress Notes (Signed)
Left message stating courtesy call and if any questions or concerns please call the doctors office.  

## 2022-06-08 ENCOUNTER — Ambulatory Visit (INDEPENDENT_AMBULATORY_CARE_PROVIDER_SITE_OTHER): Payer: BC Managed Care – PPO | Admitting: Orthopedic Surgery

## 2022-06-08 DIAGNOSIS — G5601 Carpal tunnel syndrome, right upper limb: Secondary | ICD-10-CM

## 2022-06-08 NOTE — Progress Notes (Signed)
Post-Op Visit Note   Patient: Rodney Mcclain           Date of Birth: [birth year]           MRN: [id number] Visit Date: 06/08/2022 PCP: Sheliah Hatch, MD   Assessment & Plan:  Chief Complaint:  Chief Complaint  Patient presents with   Right Hand - Routine Post Op   Visit Diagnoses:  1. Carpal tunnel syndrome, right upper limb     Plan: Patient is 10 day s/p R CTR.  He is doing well.  His previous paresthesias are improving.  His nocturnal symptoms have resolved.  He has full thenar motor function.  The incision is clean, dry, and well approximated.  The nylon sutures were removed.  He can follow up again as needed.   Follow-Up Instructions: No follow-ups on file.   Orders:  No orders of the defined types were placed in this encounter.  No orders of the defined types were placed in this encounter.   Imaging: No results found.  PMFS History: Patient Active Problem List   Diagnosis Date Noted   Carpal tunnel syndrome, right upper limb    Carpal tunnel syndrome, left upper limb    Bilateral carpal tunnel syndrome 03/17/2022   Bilateral hand numbness 02/28/2022   Acquired trigger finger of left little finger 02/28/2022   Sensorineural hearing loss (SNHL) of right ear with unrestricted hearing of left ear 11/16/2021   Vestibular hypofunction of right ear 11/16/2021   Lumbar radiculopathy 04/12/2021   Asymmetric SNHL (sensorineural hearing loss) 12/29/2020   Pain in joint of right hip 10/11/2020   PE (pulmonary thromboembolism) (HCC) 06/29/2020   Pulmonary thromboembolism (HCC) 06/29/2020   COVID-19 virus infection 06/20/2020   COVID-19 06/20/2020   Hypothyroid 10/31/2019   Snoring 01/12/2018   Dyslipidemia 12/31/2017   Vertigo 03/29/2017   Gout flare 01/19/2014   Acute gout 01/19/2014   General medical examination 03/03/2011   Morbid obesity (HCC) 01/31/2011   Anxiety and depression 01/31/2011   Mixed anxiety and depressive disorder 01/31/2011   ANAL FISSURE  06/17/2009   ALLERGIC RHINITIS DUE TO POLLEN 01/14/2009   Essential hypertension 12/14/2008   BACK PAIN, THORACIC REGION 12/14/2008   Past Medical History:  Diagnosis Date   Anal fissure    Anxiety and depression    Depression    Diverticulitis    Gout    Hypertension    Obesity    OSA on CPAP     Family History  Problem Relation Age of Onset   Arthritis Mother    Heart disease Mother    Hypertension Mother    Diabetes Mother    Sleep apnea Mother    Arthritis Father    Kidney cancer Father    Arthritis Maternal Grandfather    Stroke Maternal Grandfather    Lung cancer Other        aunt   Hypertension Other        all grandparents   Diabetes Maternal Grandmother    Sleep apnea Sister    Sleep apnea Brother    Colon cancer Neg Hx    Colon polyps Neg Hx    Esophageal cancer Neg Hx    Gallbladder disease Neg Hx     Past Surgical History:  Procedure Laterality Date   CARPAL TUNNEL RELEASE Left 04/05/2022   Procedure: LEFT CARPAL TUNNEL RELEASE;  Surgeon: [name], MD;  Location: [facility];  Service: Orthopedics;  Laterality: Left;   CARPAL  TUNNEL RELEASE Right 05/29/2022   Procedure: RIGHT CARPAL TUNNEL RELEASE;  Surgeon: [name], MD;  Location:  SURGERY CENTER;  Service: Orthopedics;  Laterality: Right;   HIP SURGERY  1986 & 1991   bilateral pin placement   TOE SURGERY     bilateral for bone spurs   Social History   Occupational History   Occupation: Librarian, academic  Tobacco Use   Smoking status: Former    Packs/day: 1.50    Years: 18.00    Total pack years: 27.00    Types: Cigarettes    Quit date: 10/09/2005    Years since quitting: 16.6   Smokeless tobacco: Never  Vaping Use   Vaping Use: Never used  Substance and Sexual Activity   Alcohol use: Yes    Alcohol/week: 0.0 standard drinks of alcohol    Comment: occ.   Drug use: No   Sexual activity: Not on file

## 2022-07-24 DIAGNOSIS — M5416 Radiculopathy, lumbar region: Secondary | ICD-10-CM | POA: Diagnosis not present

## 2022-09-17 DIAGNOSIS — M10071 Idiopathic gout, right ankle and foot: Secondary | ICD-10-CM | POA: Diagnosis not present

## 2022-09-17 DIAGNOSIS — I10 Essential (primary) hypertension: Secondary | ICD-10-CM | POA: Diagnosis not present

## 2022-09-25 ENCOUNTER — Encounter: Payer: Self-pay | Admitting: Family Medicine

## 2022-09-26 ENCOUNTER — Ambulatory Visit (INDEPENDENT_AMBULATORY_CARE_PROVIDER_SITE_OTHER): Payer: BC Managed Care – PPO | Admitting: Family

## 2022-09-26 ENCOUNTER — Encounter: Payer: Self-pay | Admitting: Family

## 2022-09-26 VITALS — BP 128/78 | HR 64 | Temp 97.9°F | Ht 71.0 in | Wt 320.4 lb

## 2022-09-26 DIAGNOSIS — R079 Chest pain, unspecified: Secondary | ICD-10-CM

## 2022-09-26 DIAGNOSIS — R110 Nausea: Secondary | ICD-10-CM

## 2022-09-26 DIAGNOSIS — E039 Hypothyroidism, unspecified: Secondary | ICD-10-CM | POA: Diagnosis not present

## 2022-09-26 DIAGNOSIS — K219 Gastro-esophageal reflux disease without esophagitis: Secondary | ICD-10-CM | POA: Diagnosis not present

## 2022-09-26 LAB — CBC WITH DIFFERENTIAL/PLATELET
Basophils Absolute: 0.1 10*3/uL (ref 0.0–0.1)
Basophils Relative: 0.9 % (ref 0.0–3.0)
Eosinophils Absolute: 0.2 10*3/uL (ref 0.0–0.7)
Eosinophils Relative: 2.2 % (ref 0.0–5.0)
HCT: 44.1 % (ref 39.0–52.0)
Hemoglobin: 15.2 g/dL (ref 13.0–17.0)
Lymphocytes Relative: 20.8 % (ref 12.0–46.0)
Lymphs Abs: 1.7 10*3/uL (ref 0.7–4.0)
MCHC: 34.3 g/dL (ref 30.0–36.0)
MCV: 96.2 fl (ref 78.0–100.0)
Monocytes Absolute: 0.6 10*3/uL (ref 0.1–1.0)
Monocytes Relative: 7.5 % (ref 3.0–12.0)
Neutro Abs: 5.5 10*3/uL (ref 1.4–7.7)
Neutrophils Relative %: 68.6 % (ref 43.0–77.0)
Platelets: 228 10*3/uL (ref 150.0–400.0)
RBC: 4.59 Mil/uL (ref 4.22–5.81)
RDW: 13.3 % (ref 11.5–15.5)
WBC: 7.9 10*3/uL (ref 4.0–10.5)

## 2022-09-26 LAB — COMPREHENSIVE METABOLIC PANEL
ALT: 10 U/L (ref 0–53)
AST: 17 U/L (ref 0–37)
Albumin: 4.5 g/dL (ref 3.5–5.2)
Alkaline Phosphatase: 67 U/L (ref 39–117)
BUN: 16 mg/dL (ref 6–23)
CO2: 30 mEq/L (ref 19–32)
Calcium: 10.1 mg/dL (ref 8.4–10.5)
Chloride: 98 mEq/L (ref 96–112)
Creatinine, Ser: 1.07 mg/dL (ref 0.40–1.50)
GFR: 81.96 mL/min (ref >60.00)
Glucose, Bld: 89 mg/dL (ref 70–99)
Potassium: 4.6 mEq/L (ref 3.5–5.1)
Sodium: 137 mEq/L (ref 135–145)
Total Bilirubin: 0.6 mg/dL (ref 0.2–1.2)
Total Protein: 7.5 g/dL (ref 6.0–8.3)

## 2022-09-26 LAB — TSH: TSH: 9.6 u[IU]/mL — ABNORMAL HIGH (ref 0.35–5.50)

## 2022-09-26 MED ORDER — OMEPRAZOLE 40 MG PO CPDR: 40.0000 mg | DELAYED_RELEASE_CAPSULE | Freq: Every day | ORAL | 3 refills | Status: DC

## 2022-09-26 NOTE — Progress Notes (Signed)
Acute Office Visit  Subjective:     Patient ID: Rodney Mcclain, male    DOB: 1975/10/19, 46 y.o.   MRN: [id number]  Chief Complaint  Patient presents with  . Nausea    Pt states he has had nausea and fever and the fever comes and goes, pt has not been able to have a regular BM.     HPI Patient is in today with concerns of nausea, feeling bloated, heartburn, constipation x 1 week. Reports over the weekend, he took bowel prep that helped him to cleanse himself out. Also reports a fever in the early afternoons of around 101. Pain is better without food and food tends to make it worse. Has not taken any medication for relief. Admits to sometimes feeling like the pain is in his chest. He feels it more lying down. Has taken Mylanta that did not help much. Reports last BM was this morning and normal. No chest pain with activity. Has a family h/o cardiovascular disease in his mom in her 9s.    Review of Systems  Constitutional: Negative.   HENT: Negative.    Respiratory: Negative.    Cardiovascular: Negative.   Gastrointestinal:  Positive for nausea. Negative for vomiting.  Musculoskeletal: Negative.   Skin: Negative.   Neurological: Negative.   Endo/Heme/Allergies: Negative.   Psychiatric/Behavioral: Negative.    All other systems reviewed and are negative.  Past Medical History:  Diagnosis Date  . Anal fissure   . Anxiety and depression   . Depression   . Diverticulitis   . Gout   . Hypertension   . Obesity   . OSA on CPAP     Social History   Socioeconomic History  . Marital status: Married    Spouse name: Not on file  . Number of children: 1  . Years of education: Not on file  . Highest education level: Not on file  Occupational History  . Occupation: Production designer, theatre/television/film  Tobacco Use  . Smoking status: Former    Packs/day: 1.50    Years: 18.00    Total pack years: 27.00    Types: Cigarettes    Quit date: 10/09/2005    Years since quitting: 16.9  .  Smokeless tobacco: Never  Vaping Use  . Vaping Use: Never used  Substance and Sexual Activity  . Alcohol use: Yes    Alcohol/week: 0.0 standard drinks of alcohol    Comment: occ.  . Drug use: No  . Sexual activity: Not on file  Other Topics Concern  . Not on file  Social History Narrative  . Not on file   Social Determinants of Health   Financial Resource Strain: Not on file  Food Insecurity: Not on file  Transportation Needs: Not on file  Physical Activity: Not on file  Stress: Not on file  Social Connections: Not on file  Intimate Partner Violence: Not on file    Past Surgical History:  Procedure Laterality Date  . CARPAL TUNNEL RELEASE Left 04/05/2022   Procedure: LEFT CARPAL TUNNEL RELEASE;  Surgeon: Sherilyn Cooter, MD;  Location: [facility];  Service: Orthopedics;  Laterality: Left;  . CARPAL TUNNEL RELEASE Right 05/29/2022   Procedure: RIGHT CARPAL TUNNEL RELEASE;  Surgeon: Sherilyn Cooter, MD;  Location: [facility];  Service: Orthopedics;  Laterality: Right;  . [place]   bilateral pin placement  . TOE SURGERY     bilateral for bone spurs    Family History  Problem Relation Age of Onset  . Arthritis Mother   . Heart disease Mother   . Hypertension Mother   . Diabetes Mother   . Sleep apnea Mother   . Arthritis Father   . Kidney cancer Father   . Arthritis Maternal Grandfather   . Stroke Maternal Grandfather   . Lung cancer Other        aunt  . Hypertension Other        all grandparents  . Diabetes Maternal Grandmother   . Sleep apnea Sister   . Sleep apnea Brother   . Colon cancer Neg Hx   . Colon polyps Neg Hx   . Esophageal cancer Neg Hx   . Gallbladder disease Neg Hx     No Known Allergies  Current Outpatient Medications on File Prior to Visit  Medication Sig Dispense Refill  . levothyroxine (SYNTHROID) 50 MCG tablet Take 1 tablet (50 mcg total) by mouth daily. 90 tablet 3  . simvastatin  (ZOCOR) 40 MG tablet Take 1 tablet (40 mg total) by mouth at bedtime. 90 tablet 1  . triamterene-hydrochlorothiazide (DYAZIDE) 37.5-25 MG capsule Take 1 each (1 capsule total) by mouth daily. 90 capsule 1  . valsartan (DIOVAN) 80 MG tablet Take 1 tablet (80 mg total) by mouth daily. 90 tablet 1  . levothyroxine (EUTHYROX) 112 MCG tablet Take 1 tablet (112 mcg total) by mouth daily. 90 tablet 0  . levothyroxine (SYNTHROID) 112 MCG tablet Take 1 tablet by mouth once daily 90 tablet 0  . levothyroxine (SYNTHROID) 50 MCG tablet Take 1 tablet by mouth daily.     No current facility-administered medications on file prior to visit.    BP 128/78   Pulse 64   Temp 97.9 F (36.6 C)   Ht _0  (1.803 m)   Wt (!) 320 lb 6.4 oz (145.3 kg)   SpO2 98%   BMI 44.69 kg/m chart      Objective:    BP 128/78   Pulse 64   Temp 97.9 F (36.6 C)   Ht _1  (1.803 m)   Wt (!) 320 lb 6.4 oz (145.3 kg)   SpO2 98%   BMI 44.69 kg/m    Physical Exam Vitals and nursing note reviewed.  Constitutional:      Appearance: Normal appearance. He is normal weight.  HENT:     Right Ear: Tympanic membrane, ear canal and external ear normal.     Left Ear: Tympanic membrane, ear canal and external ear normal.     Mouth/Throat:     Mouth: Mucous membranes are moist.  Cardiovascular:     Rate and Rhythm: Normal rate and regular rhythm.     Pulses: Normal pulses.     Heart sounds: Normal heart sounds.  Pulmonary:     Effort: Pulmonary effort is normal.     Breath sounds: Normal breath sounds.  Abdominal:     General: Abdomen is flat. Bowel sounds are normal.     Palpations: Abdomen is soft.  Musculoskeletal:        General: Normal range of motion.     Cervical back: Normal range of motion and neck supple.  Skin:    General: Skin is warm and dry.  Neurological:     General: No focal deficit present.     Mental Status: He is alert and oriented to person, place, and time. Mental status is at baseline.   Psychiatric:        Mood and  Affect: Mood normal.        Behavior: Behavior normal.    No results found for any visits on 09/26/22.      Assessment & Plan:   Problem List Items Addressed This Visit     Hypothyroid   Relevant Medications   levothyroxine (SYNTHROID) 50 MCG tablet   Other Relevant Orders   TSH   Other Visit Diagnoses     Chest pain, unspecified type    -  Primary   Relevant Orders   EKG 12-Lead (Completed)   Comp Met (CMET)   CBC w/Diff   Gastroesophageal reflux disease without esophagitis       Relevant Medications   omeprazole (PRILOSEC) 40 MG capsule   Other Relevant Orders   EKG 12-Lead (Completed)   Comp Met (CMET)   CBC w/Diff   Nausea       Relevant Orders   EKG 12-Lead (Completed)   Comp Met (CMET)   CBC w/Diff       Meds ordered this encounter  Medications  . omeprazole (PRILOSEC) 40 MG capsule    Sig: Take 1 capsule (40 mg total) by mouth daily.    Dispense:  30 capsule    Refill:  3   Recheck TSH, last TSH was abnormal. Start Omeprazole for GERD. Labs obtained will notify patient pending results. Call if symptoms worsen or persist.    No follow-ups on file.  Kennyth Arnold, FNP

## 2022-09-27 ENCOUNTER — Other Ambulatory Visit: Payer: Self-pay

## 2022-09-27 ENCOUNTER — Telehealth: Payer: Self-pay | Admitting: Family Medicine

## 2022-09-27 MED ORDER — LEVOTHYROXINE SODIUM 125 MCG PO TABS: 125.0000 ug | ORAL_TABLET | Freq: Every day | ORAL | 0 refills | Status: DC

## 2022-09-27 NOTE — Telephone Encounter (Signed)
error 

## 2022-10-30 DIAGNOSIS — M65352 Trigger finger, left little finger: Secondary | ICD-10-CM | POA: Diagnosis not present

## 2022-10-31 ENCOUNTER — Telehealth: Payer: Self-pay | Admitting: *Deleted

## 2022-10-31 NOTE — Telephone Encounter (Signed)
   Pre-operative Risk Assessment    Patient Name: Jerid Catherman  DOB: Feb 24, 1976 MRN: [id number]    PT IS GOING TO NEED A NEW PT APPT AS PT LAST SEEN BY CARDIOLOGY 2019. NOTES HAVE BEEN SENT TO CHART PREP AND SCHEDULING TO REACH OUT TO THE PT WITH A NEW PT APPT.   Request for Surgical Clearance    Procedure:   LEFT SMALL FINGER TRIGGER RELEASE   Date of Surgery:  Clearance TBD                                 Surgeon:  DR. CHARLES BENFIELD Surgeon's Group or Practice Name:  [facility] Phone number:  [phone] ATTN: [place]  Fax number:  [phone]   Type of Clearance Requested:   - Medical  - Pharmacy:  Hold Apixaban (Eliquis)     Type of Anesthesia:   CHOICE   Additional requests/questions:    Jiles Prows   10/31/2022, 3:25 PM

## 2022-11-14 ENCOUNTER — Ambulatory Visit (INDEPENDENT_AMBULATORY_CARE_PROVIDER_SITE_OTHER): Payer: BC Managed Care – PPO | Admitting: Podiatry

## 2022-11-14 ENCOUNTER — Encounter: Payer: Self-pay | Admitting: Podiatry

## 2022-11-14 ENCOUNTER — Ambulatory Visit (INDEPENDENT_AMBULATORY_CARE_PROVIDER_SITE_OTHER): Payer: BC Managed Care – PPO

## 2022-11-14 DIAGNOSIS — M722 Plantar fascial fibromatosis: Secondary | ICD-10-CM

## 2022-11-14 DIAGNOSIS — M7752 Other enthesopathy of left foot: Secondary | ICD-10-CM | POA: Diagnosis not present

## 2022-11-14 MED ORDER — METHYLPREDNISOLONE 4 MG PO TBPK: ORAL_TABLET | ORAL | 0 refills | Status: DC

## 2022-11-14 MED ORDER — DEXAMETHASONE SODIUM PHOSPHATE 120 MG/30ML IJ SOLN
4.0000 mg | Freq: Once | INTRAMUSCULAR | Status: AC
  Administered 2022-11-14: 4 mg via INTRA_ARTICULAR

## 2022-11-14 NOTE — Progress Notes (Signed)
  Subjective:  Patient ID: Rodney Mcclain, male    DOB: Feb 06, 1976,   MRN: [id number]  Chief Complaint  Patient presents with   Foot Pain    Patient states that his foot pain began on Sunday. He started wearing  a boot yesterday and took some aleve. The last time he took aleve was at 9:00am. Pain is currently 19/10    47 y.o. male presents for concern of left foot pain that began on Sunday. He does have a history of gout on the right foot and ankle. States this feels different. Relates he started wearing a boot and took some aleve and that has helped pain. Denies any injury but was out in the yard and couldve twisted something.   . Denies any other pedal complaints. Denies n/v/f/c.   Past Medical History:  Diagnosis Date   Anal fissure    Anxiety and depression    Depression    Diverticulitis    Gout    Hypertension    Obesity    OSA on CPAP     Objective:  Physical Exam: Vascular: DP/PT pulses 2/4 bilateral. CFT <3 seconds. Normal hair growth on digits. Edema left ankle no erythema  Skin. No lacerations or abrasions bilateral feet.  Musculoskeletal: MMT 5/5 bilateral lower extremities in DF, PF, Inversion and Eversion. Deceased ROM in DF of ankle joint. Tender around left ankle anterior medial and lateral ankle joint line.  Neurological: Sensation intact to light touch.   Assessment:   1. Capsulitis of ankle, left   2. Plantar fasciitis, left      Plan:  Patient was evaluated and treated and all questions answered. -Xrays reviewed. No acute fractures or dislocations noted.  -Discussed treatement options for capsulitis of ankle and gout education provided. -Patient opted for injection. After oral consent, injected right/left <location> with 1cc lidocaine and marcaine plain mixed with 1cc Dexmethasone phosphate without complication; post injection care explained. Cotninue with NWB in CAM boot.  -Medrol dose pack provided.  -Advised patient to call if symptoms are not improved  within 1 week -Patient to return in 3 weeks for re-check/further discussion for long term management of gout or sooner if condition worsens.   Lorenda Peck, DPM

## 2022-11-16 ENCOUNTER — Other Ambulatory Visit: Payer: Self-pay | Admitting: Podiatry

## 2022-11-16 DIAGNOSIS — M722 Plantar fascial fibromatosis: Secondary | ICD-10-CM

## 2022-11-16 DIAGNOSIS — M7752 Other enthesopathy of left foot: Secondary | ICD-10-CM

## 2022-12-04 ENCOUNTER — Ambulatory Visit: Payer: BC Managed Care – PPO | Admitting: Podiatry

## 2022-12-05 ENCOUNTER — Encounter: Payer: Self-pay | Admitting: Family Medicine

## 2022-12-05 ENCOUNTER — Ambulatory Visit (INDEPENDENT_AMBULATORY_CARE_PROVIDER_SITE_OTHER): Payer: BC Managed Care – PPO | Admitting: Family Medicine

## 2022-12-05 VITALS — BP 130/84 | HR 64 | Temp 98.9°F | Resp 18 | Ht 71.0 in | Wt 333.5 lb

## 2022-12-05 DIAGNOSIS — G4700 Insomnia, unspecified: Secondary | ICD-10-CM

## 2022-12-05 DIAGNOSIS — Z0000 Encounter for general adult medical examination without abnormal findings: Secondary | ICD-10-CM

## 2022-12-05 LAB — LIPID PANEL
Cholesterol: 180 mg/dL (ref 0–200)
HDL: 67.8 mg/dL (ref >39.00)
LDL Cholesterol: 80 mg/dL (ref 0–99)
NonHDL: 112.33
Total CHOL/HDL Ratio: 3
Triglycerides: 163 mg/dL — ABNORMAL HIGH (ref 0.0–149.0)
VLDL: 32.6 mg/dL (ref 0.0–40.0)

## 2022-12-05 LAB — CBC WITH DIFFERENTIAL/PLATELET
Basophils Absolute: 0.1 10*3/uL (ref 0.0–0.1)
Basophils Relative: 1.1 % (ref 0.0–3.0)
Eosinophils Absolute: 0.2 10*3/uL (ref 0.0–0.7)
Eosinophils Relative: 3.6 % (ref 0.0–5.0)
HCT: 44.3 % (ref 39.0–52.0)
Hemoglobin: 15.3 g/dL (ref 13.0–17.0)
Lymphocytes Relative: 24.8 % (ref 12.0–46.0)
Lymphs Abs: 1.4 10*3/uL (ref 0.7–4.0)
MCHC: 34.5 g/dL (ref 30.0–36.0)
MCV: 95.3 fl (ref 78.0–100.0)
Monocytes Absolute: 0.5 10*3/uL (ref 0.1–1.0)
Monocytes Relative: 8.4 % (ref 3.0–12.0)
Neutro Abs: 3.5 10*3/uL (ref 1.4–7.7)
Neutrophils Relative %: 62.1 % (ref 43.0–77.0)
Platelets: 194 10*3/uL (ref 150.0–400.0)
RBC: 4.65 Mil/uL (ref 4.22–5.81)
RDW: 13.4 % (ref 11.5–15.5)
WBC: 5.7 10*3/uL (ref 4.0–10.5)

## 2022-12-05 LAB — HEPATIC FUNCTION PANEL
ALT: 14 U/L (ref 0–53)
AST: 18 U/L (ref 0–37)
Albumin: 4.3 g/dL (ref 3.5–5.2)
Alkaline Phosphatase: 66 U/L (ref 39–117)
Bilirubin, Direct: 0.2 mg/dL (ref 0.0–0.3)
Total Bilirubin: 0.7 mg/dL (ref 0.2–1.2)
Total Protein: 7 g/dL (ref 6.0–8.3)

## 2022-12-05 LAB — TSH: TSH: 2.6 u[IU]/mL (ref 0.35–5.50)

## 2022-12-05 LAB — BASIC METABOLIC PANEL
BUN: 20 mg/dL (ref 6–23)
CO2: 29 mEq/L (ref 19–32)
Calcium: 9.8 mg/dL (ref 8.4–10.5)
Chloride: 99 mEq/L (ref 96–112)
Creatinine, Ser: 1.06 mg/dL (ref 0.40–1.50)
GFR: 82.78 mL/min (ref >60.00)
Glucose, Bld: 91 mg/dL (ref 70–99)
Potassium: 4.6 mEq/L (ref 3.5–5.1)
Sodium: 137 mEq/L (ref 135–145)

## 2022-12-05 LAB — HEMOGLOBIN A1C: Hgb A1c MFr Bld: 5.4 % (ref 4.6–6.5)

## 2022-12-05 LAB — VITAMIN D 25 HYDROXY (VIT D DEFICIENCY, FRACTURES): VITD: 19.14 ng/mL — ABNORMAL LOW (ref 30.00–100.00)

## 2022-12-05 MED ORDER — INDOMETHACIN 50 MG PO CAPS: 50.0000 mg | ORAL_CAPSULE | Freq: Three times a day (TID) | ORAL | 1 refills | Status: AC | PRN

## 2022-12-05 MED ORDER — TRAZODONE HCL 50 MG PO TABS: 25.0000 mg | ORAL_TABLET | Freq: Every evening | ORAL | 3 refills | Status: DC | PRN

## 2022-12-05 NOTE — Progress Notes (Signed)
   Subjective:    Patient ID: Rodney Mcclain, male    DOB: [birth year], 47 y.o.   MRN: [id number]  HPI CPE- UTD on colonoscopy, Tdap  Patient Care Team    Relationship Specialty Notifications Start End  Midge Minium, MD PCP - General Family Medicine  04/07/11      Health Maintenance  Topic Date Due   INFLUENZA VACCINE  01/07/2023 (Originally 05/09/2022)   COLONOSCOPY (Pts 45-30yr Insurance coverage will need to be confirmed)  02/01/2025   DTaP/Tdap/Td (3 - Td or Tdap) 10/11/2027   HPV VACCINES  Aged Out   COVID-19 Vaccine  Discontinued   Hepatitis C Screening  Discontinued   HIV Screening  Discontinued      Review of Systems Patient reports no vision/hearing changes, anorexia, fever ,adenopathy, persistant/recurrent hoarseness, swallowing issues, chest pain, palpitations, edema, persistant/recurrent cough, hemoptysis, dyspnea (rest,exertional, paroxysmal nocturnal), gastrointestinal  bleeding (melena, rectal bleeding), abdominal pain, excessive heart burn, GU symptoms (dysuria, hematuria, voiding/incontinence issues) syncope, focal weakness, memory loss, numbness & tingling, skin/hair/nail changes, depression, anxiety, abnormal bruising/bleeding, musculoskeletal symptoms/signs.   + 14 lb weight gain- pt is frustrated b/c he is eating and walking the same as he did when he lost 50 lbs + insomnia- pt reports tossing and turning, taking OTC 'PM' products nightly w/o good relief.      Objective:   Physical Exam General Appearance:    Alert, cooperative, no distress, appears stated age, obese  Head:    Normocephalic, without obvious abnormality, atraumatic  Eyes:    PERRL, conjunctiva/corneas clear, EOM's intact both eyes       Ears:    Normal TM's and external ear canals, both ears  Nose:   Nares normal, septum midline, mucosa normal, no drainage   or sinus tenderness  Throat:   Lips, mucosa, and tongue normal; teeth and gums normal  Neck:   Supple, symmetrical, trachea midline,  no adenopathy;       thyroid:  No enlargement/tenderness/nodules  Back:     Symmetric, no curvature, ROM normal, no CVA tenderness  Lungs:     Clear to auscultation bilaterally, respirations unlabored  Chest wall:    No tenderness or deformity  Heart:    Regular rate and rhythm, S1 and S2 normal, no murmur, rub   or gallop  Abdomen:     Soft, non-tender, bowel sounds active all four quadrants,    no masses, no organomegaly  Genitalia:    Normal male without lesion, masses,discharge or tenderness  Rectal:    Deferred due to young age  Extremities:   Extremities normal, atraumatic, no cyanosis or edema  Pulses:   2+ and symmetric all extremities  Skin:   Skin color, texture, turgor normal, no rashes or lesions  Lymph nodes:   Cervical, supraclavicular, and axillary nodes normal  Neurologic:   CNII-XII intact. Normal strength, sensation and reflexes      throughout          Assessment & Plan:

## 2022-12-05 NOTE — Assessment & Plan Note (Signed)
New.  Pt reports he is not able to sleep at night.  Denies snoring or breathing pauses.  Taking multiple OTC 'PM' products w/o relief.  Will start low dose Trazodone and monitor for improvement.  Pt expressed understanding and is in agreement w/ plan.

## 2022-12-05 NOTE — Assessment & Plan Note (Signed)
Deteriorated.  Pt has gained 14 lbs since last visit.  This frustrates him as he states there has been no change in diet or exercise.  Discussed the impact of sleep on weight and how this could be impacting his weight loss efforts.  Will tx insomnia w/ Trazodone and monitor.  Check labs to risk stratify.  Will follow.

## 2022-12-05 NOTE — Assessment & Plan Note (Signed)
Pt's PE WNL w/ exception of obesity.  UTD on colonoscopy, Tdap.  Check labs.  Anticipatory guidance provided.

## 2022-12-05 NOTE — Patient Instructions (Addendum)
Follow up in 6 months to recheck BP and cholesterol We'll notify you of your lab results and make any changes if needed Continue to work on healthy diet and regular exercise- you can do it!! START the Trazodone nightly for sleep Use the Indomethacin as needed for gout Call with any questions or concerns Stay Safe!  Stay Healthy! Happy Spring!!!

## 2022-12-06 ENCOUNTER — Other Ambulatory Visit: Payer: Self-pay

## 2022-12-06 ENCOUNTER — Encounter: Payer: Self-pay | Admitting: Family Medicine

## 2022-12-06 MED ORDER — VITAMIN D (ERGOCALCIFEROL) 1.25 MG (50000 UNIT) PO CAPS: 50000.0000 [IU] | ORAL_CAPSULE | ORAL | 0 refills | Status: DC

## 2022-12-06 NOTE — Progress Notes (Signed)
Prescription for Vitamin D 50,000 12x weeks has been sent in.   [place]  [phone]

## 2022-12-08 DIAGNOSIS — M65352 Trigger finger, left little finger: Secondary | ICD-10-CM | POA: Diagnosis not present

## 2022-12-08 MED ORDER — ZEPBOUND 2.5 MG/0.5ML ~~LOC~~ SOAJ: 2.5000 mg | SUBCUTANEOUS | 0 refills | Status: DC

## 2022-12-20 ENCOUNTER — Other Ambulatory Visit: Payer: Self-pay | Admitting: Family Medicine

## 2022-12-22 DIAGNOSIS — M65311 Trigger thumb, right thumb: Secondary | ICD-10-CM | POA: Insufficient documentation

## 2022-12-23 DIAGNOSIS — M5416 Radiculopathy, lumbar region: Secondary | ICD-10-CM | POA: Diagnosis not present

## 2022-12-27 ENCOUNTER — Other Ambulatory Visit: Payer: Self-pay

## 2022-12-27 DIAGNOSIS — K219 Gastro-esophageal reflux disease without esophagitis: Secondary | ICD-10-CM

## 2022-12-27 MED ORDER — OMEPRAZOLE 40 MG PO CPDR: 40.0000 mg | DELAYED_RELEASE_CAPSULE | Freq: Every day | ORAL | 3 refills | Status: DC

## 2023-01-14 ENCOUNTER — Other Ambulatory Visit: Payer: Self-pay | Admitting: Family Medicine

## 2023-01-15 DIAGNOSIS — M545 Low back pain, unspecified: Secondary | ICD-10-CM | POA: Insufficient documentation

## 2023-01-15 DIAGNOSIS — M5459 Other low back pain: Secondary | ICD-10-CM | POA: Diagnosis not present

## 2023-01-16 ENCOUNTER — Other Ambulatory Visit: Payer: Self-pay | Admitting: Family Medicine

## 2023-01-16 MED ORDER — ZEPBOUND 2.5 MG/0.5ML ~~LOC~~ SOAJ: 2.5000 mg | SUBCUTANEOUS | 0 refills | Status: DC

## 2023-01-25 DIAGNOSIS — M5459 Other low back pain: Secondary | ICD-10-CM | POA: Diagnosis not present

## 2023-01-26 DIAGNOSIS — M4800 Spinal stenosis, site unspecified: Secondary | ICD-10-CM | POA: Insufficient documentation

## 2023-01-29 ENCOUNTER — Other Ambulatory Visit: Payer: Self-pay | Admitting: Family Medicine

## 2023-01-30 ENCOUNTER — Other Ambulatory Visit: Payer: Self-pay | Admitting: Family Medicine

## 2023-02-02 ENCOUNTER — Telehealth: Payer: Self-pay | Admitting: Family Medicine

## 2023-02-02 NOTE — Telephone Encounter (Signed)
Zella Ball called stating that this patient insurance only over 180 days on  tirzepatide (ZEPBOUND) 2.5 MG/0.5ML Pen. If Dr.Tabori wants to keep patient on the 2.5, patient will need a PA for this medication. Zella Ball stated that this medication was filled on 12/26/2022   Would you like to keep pt on the 2.5 mg ? If you do I an obtain a PA for pt ?

## 2023-02-02 NOTE — Telephone Encounter (Signed)
Caller name: Zella Ball   On DPR?: no  Call back number: [phone]  Provider they see: Sheliah Hatch, MD  Reason for call:  Zella Ball called stating that this patient insurance only over 180 days on  tirzepatide (ZEPBOUND) 2.5 MG/0.5ML Pen. If Dr.Tabori wants to keep patient on the 2.5, patient will need a PA for this medication. Zella Ball stated that this medication was filled on 12/26/2022.

## 2023-02-05 MED ORDER — ZEPBOUND 5 MG/0.5ML ~~LOC~~ SOAJ: 5.0000 mg | SUBCUTANEOUS | 1 refills | Status: DC

## 2023-02-05 NOTE — Telephone Encounter (Signed)
Pt has been made aware. 

## 2023-02-05 NOTE — Telephone Encounter (Signed)
Please let pt know we increased his Zepbound to 5mg  weekly based on the titration schedule covered by his insurance.

## 2023-02-05 NOTE — Addendum Note (Signed)
Addended by: Sheliah Hatch on: 02/05/2023 10:41 AM   Modules accepted: Orders

## 2023-02-06 DIAGNOSIS — M5416 Radiculopathy, lumbar region: Secondary | ICD-10-CM | POA: Diagnosis not present

## 2023-02-19 DIAGNOSIS — M5136 Other intervertebral disc degeneration, lumbar region: Secondary | ICD-10-CM | POA: Diagnosis not present

## 2023-02-19 DIAGNOSIS — M5451 Vertebrogenic low back pain: Secondary | ICD-10-CM | POA: Diagnosis not present

## 2023-02-19 DIAGNOSIS — M48062 Spinal stenosis, lumbar region with neurogenic claudication: Secondary | ICD-10-CM | POA: Diagnosis not present

## 2023-02-19 DIAGNOSIS — Z6841 Body Mass Index (BMI) 40.0 and over, adult: Secondary | ICD-10-CM | POA: Diagnosis not present

## 2023-03-17 ENCOUNTER — Other Ambulatory Visit: Payer: Self-pay | Admitting: Family Medicine

## 2023-03-27 ENCOUNTER — Other Ambulatory Visit: Payer: Self-pay | Admitting: Family Medicine

## 2023-04-10 ENCOUNTER — Other Ambulatory Visit: Payer: Self-pay | Admitting: Family Medicine

## 2023-04-10 MED ORDER — VALSARTAN 80 MG PO TABS: 80.0000 mg | ORAL_TABLET | Freq: Every day | ORAL | 0 refills | Status: DC

## 2023-04-10 MED ORDER — SIMVASTATIN 40 MG PO TABS: 40.0000 mg | ORAL_TABLET | Freq: Every day | ORAL | 0 refills | Status: DC

## 2023-04-20 ENCOUNTER — Other Ambulatory Visit: Payer: Self-pay | Admitting: Family Medicine

## 2023-04-20 MED ORDER — LEVOTHYROXINE SODIUM 125 MCG PO TABS: 125.0000 ug | ORAL_TABLET | Freq: Every day | ORAL | 0 refills | Status: DC

## 2023-04-24 ENCOUNTER — Other Ambulatory Visit: Payer: Self-pay | Admitting: Family Medicine

## 2023-04-24 NOTE — Telephone Encounter (Signed)
Patient is requesting a refill of the following medications: Requested Prescriptions   Pending Prescriptions Disp Refills   traZODone (DESYREL) 50 MG tablet [Pharmacy Med Name: traZODone HCl 50 MG Oral Tablet] 30 tablet 0    Sig: TAKE 1/2 TO 1 (ONE-HALF TO ONE) TABLET BY MOUTH AT BEDTIME AS NEEDED FOR SLEEP    Date of patient request: 04/24/23 Last office visit: 12/05/22 Date of last refill: 03/27/23 Last refill amount: 30

## 2023-05-09 ENCOUNTER — Encounter (INDEPENDENT_AMBULATORY_CARE_PROVIDER_SITE_OTHER): Payer: Self-pay

## 2023-06-05 ENCOUNTER — Ambulatory Visit: Payer: BC Managed Care – PPO | Admitting: Family Medicine

## 2023-06-06 ENCOUNTER — Ambulatory Visit (INDEPENDENT_AMBULATORY_CARE_PROVIDER_SITE_OTHER): Payer: 59 | Admitting: Family Medicine

## 2023-06-06 ENCOUNTER — Encounter: Payer: Self-pay | Admitting: Family Medicine

## 2023-06-06 VITALS — BP 128/82 | HR 64 | Temp 98.8°F | Resp 18 | Wt 302.2 lb

## 2023-06-06 DIAGNOSIS — E785 Hyperlipidemia, unspecified: Secondary | ICD-10-CM | POA: Diagnosis not present

## 2023-06-06 DIAGNOSIS — I10 Essential (primary) hypertension: Secondary | ICD-10-CM | POA: Diagnosis not present

## 2023-06-06 DIAGNOSIS — E039 Hypothyroidism, unspecified: Secondary | ICD-10-CM | POA: Diagnosis not present

## 2023-06-06 NOTE — Progress Notes (Signed)
   Subjective:    Patient ID: Cobe Samaniego, male    DOB: 14-Sep-1976, 47 y.o.   MRN: [id number]  HPI HTN- chronic problem, on Valsartan 80mg  daily, Triamterene hydrochlorothiazide 37.5/25 mg daily.  No CP, SOB, visual changes, edema  Hyperlipidemia- chronic problem, on Simvastatin 40mg  daily.  No abd pain, N/V.  Hypothyroid- chronic problem, on Levothyroxine daily.  No changes to skin/hair/nails.  No change in energy level  Obesity- pt is down 32 lbs.  Was down 42 lbs but gained 10 back due to inactivity in the heat.     Review of Systems For ROS see HPI     Objective:   Physical Exam Vitals reviewed.  Constitutional:      General: He is not in acute distress.    Appearance: Normal appearance. He is well-developed. He is obese. He is not ill-appearing.  HENT:     Head: Normocephalic and atraumatic.  Eyes:     Extraocular Movements: Extraocular movements intact.     Conjunctiva/sclera: Conjunctivae normal.     Pupils: Pupils are equal, round, and reactive to light.  Neck:     Thyroid: No thyromegaly.  Cardiovascular:     Rate and Rhythm: Normal rate and regular rhythm.     Pulses: Normal pulses.     Heart sounds: Normal heart sounds. No murmur heard. Pulmonary:     Effort: Pulmonary effort is normal. No respiratory distress.     Breath sounds: Normal breath sounds.  Abdominal:     General: Bowel sounds are normal. There is no distension.     Palpations: Abdomen is soft.  Musculoskeletal:     Cervical back: Normal range of motion and neck supple.     Right lower leg: No edema.     Left lower leg: No edema.  Lymphadenopathy:     Cervical: No cervical adenopathy.  Skin:    General: Skin is warm and dry.  Neurological:     General: No focal deficit present.     Mental Status: He is alert and oriented to person, place, and time.     Cranial Nerves: No cranial nerve deficit.  Psychiatric:        Mood and Affect: Mood normal.        Behavior: Behavior normal.            Assessment & Plan:

## 2023-06-06 NOTE — Assessment & Plan Note (Signed)
Chronic problem.  Adequate control on Valsartan, Triamterene hydrochlorothiazide.  Currently asymptomatic.  Check labs due to use of ARB and diuretic.  No anticipated med changes.  Will follow.

## 2023-06-06 NOTE — Assessment & Plan Note (Signed)
Chronic problem.  Currently on Simvastatin '40mg'$  daily w/o difficulty.  Check labs.  Adjust meds prn

## 2023-06-06 NOTE — Assessment & Plan Note (Signed)
Pt is down 32 lbs since last visit.  Applauded his efforts and encouraged him to continue working on low carb/low sugar diet and regular physical activity.

## 2023-06-06 NOTE — Assessment & Plan Note (Signed)
Chronic problem.  Currently asymptomatic on Levothyroxine daily.  Check labs.  Adjust meds prn

## 2023-06-06 NOTE — Patient Instructions (Signed)
Schedule your complete physical in 6 months We'll notify you of your lab results and make any changes if needed Keep up the good work on healthy diet and regular exercise- you're doing great! Call with any questions or concerns Stay Safe!  Stay Healthy!! Happy Labor Day!!

## 2023-06-07 LAB — LIPID PANEL
Cholesterol: 158 mg/dL (ref 0–200)
HDL: 62.6 mg/dL (ref >39.00)
LDL Cholesterol: 70 mg/dL (ref 0–99)
NonHDL: 95.83
Total CHOL/HDL Ratio: 3
Triglycerides: 130 mg/dL (ref 0.0–149.0)
VLDL: 26 mg/dL (ref 0.0–40.0)

## 2023-06-07 LAB — BASIC METABOLIC PANEL
BUN: 20 mg/dL (ref 6–23)
CO2: 27 meq/L (ref 19–32)
Calcium: 9.5 mg/dL (ref 8.4–10.5)
Chloride: 101 meq/L (ref 96–112)
Creatinine, Ser: 1.09 mg/dL (ref 0.40–1.50)
GFR: 79.77 mL/min (ref >60.00)
Glucose, Bld: 82 mg/dL (ref 70–99)
Potassium: 4.2 meq/L (ref 3.5–5.1)
Sodium: 137 meq/L (ref 135–145)

## 2023-06-07 LAB — CBC WITH DIFFERENTIAL/PLATELET
Basophils Absolute: 0.1 10*3/uL (ref 0.0–0.1)
Basophils Relative: 1.1 % (ref 0.0–3.0)
Eosinophils Absolute: 0.2 10*3/uL (ref 0.0–0.7)
Eosinophils Relative: 2.4 % (ref 0.0–5.0)
HCT: 42.1 % (ref 39.0–52.0)
Hemoglobin: 14.2 g/dL (ref 13.0–17.0)
Lymphocytes Relative: 23.6 % (ref 12.0–46.0)
Lymphs Abs: 1.5 10*3/uL (ref 0.7–4.0)
MCHC: 33.7 g/dL (ref 30.0–36.0)
MCV: 97.1 fl (ref 78.0–100.0)
Monocytes Absolute: 0.5 10*3/uL (ref 0.1–1.0)
Monocytes Relative: 8.5 % (ref 3.0–12.0)
Neutro Abs: 4.1 10*3/uL (ref 1.4–7.7)
Neutrophils Relative %: 64.4 % (ref 43.0–77.0)
Platelets: 182 10*3/uL (ref 150.0–400.0)
RBC: 4.33 Mil/uL (ref 4.22–5.81)
RDW: 13.5 % (ref 11.5–15.5)
WBC: 6.4 10*3/uL (ref 4.0–10.5)

## 2023-06-07 LAB — HEPATIC FUNCTION PANEL
ALT: 11 U/L (ref 0–53)
AST: 17 U/L (ref 0–37)
Albumin: 4.3 g/dL (ref 3.5–5.2)
Alkaline Phosphatase: 52 U/L (ref 39–117)
Bilirubin, Direct: 0.2 mg/dL (ref 0.0–0.3)
Total Bilirubin: 0.7 mg/dL (ref 0.2–1.2)
Total Protein: 6.6 g/dL (ref 6.0–8.3)

## 2023-06-07 LAB — TSH: TSH: 2.89 u[IU]/mL (ref 0.35–5.50)

## 2023-06-07 LAB — HEMOGLOBIN A1C: Hgb A1c MFr Bld: 5.2 % (ref 4.6–6.5)

## 2023-06-07 LAB — VITAMIN D 25 HYDROXY (VIT D DEFICIENCY, FRACTURES): VITD: 52.73 ng/mL (ref 30.00–100.00)

## 2023-06-08 ENCOUNTER — Telehealth: Payer: Self-pay

## 2023-06-08 NOTE — Telephone Encounter (Signed)
Pt aware of results 

## 2023-06-08 NOTE — Telephone Encounter (Signed)
-----   Message from Neena Rhymes sent at 06/08/2023  7:23 AM EDT ----- Labs look great!  No changes at this time

## 2023-06-15 ENCOUNTER — Ambulatory Visit: Payer: 59 | Admitting: Podiatry

## 2023-06-15 DIAGNOSIS — M7751 Other enthesopathy of right foot: Secondary | ICD-10-CM

## 2023-06-15 NOTE — Progress Notes (Signed)
Subjective:  Patient ID: Rodney Mcclain, male    DOB: [birth year],  MRN: [id number]  Chief Complaint  Patient presents with   Foot Pain    Rt foot pain in the ball of the foot pt stated it feels like a blister but only feels it when he walks     47 y.o. male presents with the above complaint.  Patient presents with complaint of right second metatarsophalangeal joint painful to touch is progressive gotten worse worse with ambulation worse with fish walking.  He states that there is has pain in the ball of the foot feels like he is walking on a blister.  Pain scale is 5 out of 10 dull achy in nature.  More painful when he is barefooted.   Review of Systems: Negative except as noted in the HPI. Denies N/V/F/Ch.  Past Medical History:  Diagnosis Date   Anal fissure    Anxiety and depression    Depression    Diverticulitis    Gout    Hypertension    Obesity    OSA on CPAP     Current Outpatient Medications:    indomethacin (INDOCIN) 50 MG capsule, Take 1 capsule (50 mg total) by mouth 3 (three) times daily as needed., Disp: 60 capsule, Rfl: 1   levothyroxine (SYNTHROID) 125 MCG tablet, Take 1 tablet (125 mcg total) by mouth daily., Disp: 90 tablet, Rfl: 0   Multiple Vitamin (MULTIVITAMIN) tablet, Take 1 tablet by mouth daily., Disp: , Rfl:    simvastatin (ZOCOR) 40 MG tablet, Take 1 tablet (40 mg total) by mouth at bedtime., Disp: 90 tablet, Rfl: 0   traZODone (DESYREL) 50 MG tablet, TAKE 1/2 TO 1 (ONE-HALF TO ONE) TABLET BY MOUTH AT BEDTIME AS NEEDED FOR SLEEP, Disp: 30 tablet, Rfl: 3   triamterene-hydrochlorothiazide (DYAZIDE) 37.5-25 MG capsule, Take 1 each (1 capsule total) by mouth daily., Disp: 90 capsule, Rfl: 1   valsartan (DIOVAN) 80 MG tablet, Take 1 tablet (80 mg total) by mouth daily., Disp: 90 tablet, Rfl: 0  Social History   Tobacco Use  Smoking Status Former   Current packs/day: 0.00   Average packs/day: 1.5 packs/day for 18.0 years (27.0 ttl pk-yrs)   Types:  Cigarettes   Start date: 10/10/1987   Quit date: 10/09/2005   Years since quitting: 17.7  Smokeless Tobacco Never    No Known Allergies Objective:  There were no vitals filed for this visit. There is no height or weight on file to calculate BMI. Constitutional Well developed. Well nourished.  Vascular Dorsalis pedis pulses palpable bilaterally. Posterior tibial pulses palpable bilaterally. Capillary refill normal to all digits.  No cyanosis or clubbing noted. Pedal hair growth normal.  Neurologic Normal speech. Oriented to person, place, and time. Epicritic sensation to light touch grossly present bilaterally.  Dermatologic Nails well groomed and normal in appearance. No open wounds. No skin lesions.  Orthopedic: Pain on palpation of right second metatarsophalangeal joint pain with range of motion of the joint no deep intra-articular pain noted.  No pain at the respective metatarsophalangeal joint.  No extensor or flexor tendinitis noted   Radiographs: None Assessment:   1. Capsulitis of metatarsophalangeal (MTP) joint of right foot    Plan:  Patient was evaluated and treated and all questions answered.  Right second metatarsophalangeal joint capsulitis -All questions and concerns were discussed with the patient in extensive detail given the amount of pain that is having a benefit from steroid injection help decrease acute inflammatory component associated  pain.  Patient agrees with plan like to proceed with steroid injection -I discussed shoe gear modification -Also discussed metatarsal pads and may benefit from orthotics in the future  No follow-ups on file.

## 2023-07-02 ENCOUNTER — Other Ambulatory Visit: Payer: Self-pay | Admitting: Family Medicine

## 2023-07-02 ENCOUNTER — Other Ambulatory Visit: Payer: Self-pay

## 2023-07-02 DIAGNOSIS — E039 Hypothyroidism, unspecified: Secondary | ICD-10-CM

## 2023-07-02 MED ORDER — SIMVASTATIN 40 MG PO TABS
40.0000 mg | ORAL_TABLET | Freq: Every day | ORAL | 0 refills | Status: DC
Start: 2023-07-02 — End: 2024-01-02

## 2023-07-02 NOTE — Telephone Encounter (Signed)
Last refill 04/10/2023

## 2023-07-09 ENCOUNTER — Other Ambulatory Visit: Payer: Self-pay | Admitting: Family Medicine

## 2023-07-10 MED ORDER — VALSARTAN 80 MG PO TABS: 80.0000 mg | ORAL_TABLET | Freq: Every day | ORAL | 0 refills | Status: DC

## 2023-07-12 ENCOUNTER — Other Ambulatory Visit: Payer: Self-pay | Admitting: Family Medicine

## 2023-07-13 ENCOUNTER — Ambulatory Visit (INDEPENDENT_AMBULATORY_CARE_PROVIDER_SITE_OTHER): Payer: BC Managed Care – PPO | Admitting: Podiatry

## 2023-07-13 DIAGNOSIS — M7751 Other enthesopathy of right foot: Secondary | ICD-10-CM

## 2023-07-13 NOTE — Progress Notes (Signed)
Subjective:  Patient ID: Rodney Mcclain, male    DOB: 1976/03/27,  MRN: [id number]  Chief Complaint  Patient presents with   Foot Pain    47 y.o. male presents with the above complaint.  Patient presents for follow-up right second metatarsophalangeal joint pain.  Patient states the injection helped.  He still some residual pain wanted to discuss next treatment plan including injection.  Review of Systems: Negative except as noted in the HPI. Denies N/V/F/Ch.  Past Medical History:  Diagnosis Date   Anal fissure    Anxiety and depression    Depression    Diverticulitis    Gout    Hypertension    Obesity    OSA on CPAP     Current Outpatient Medications:    indomethacin (INDOCIN) 50 MG capsule, Take 1 capsule (50 mg total) by mouth 3 (three) times daily as needed., Disp: 60 capsule, Rfl: 1   levothyroxine (SYNTHROID) 125 MCG tablet, Take 1 tablet (125 mcg total) by mouth daily., Disp: 90 tablet, Rfl: 0   Multiple Vitamin (MULTIVITAMIN) tablet, Take 1 tablet by mouth daily., Disp: , Rfl:    simvastatin (ZOCOR) 40 MG tablet, TAKE 1 TABLET BY MOUTH EVERY DAY AT BEDTIME, Disp: 90 tablet, Rfl: 0   simvastatin (ZOCOR) 40 MG tablet, Take 1 tablet (40 mg total) by mouth at bedtime., Disp: 90 tablet, Rfl: 0   traZODone (DESYREL) 50 MG tablet, TAKE 1/2 TO 1 (ONE-HALF TO ONE) TABLET BY MOUTH AT BEDTIME AS NEEDED FOR SLEEP, Disp: 30 tablet, Rfl: 3   triamterene-hydrochlorothiazide (DYAZIDE) 37.5-25 MG capsule, Take 1 each (1 capsule total) by mouth daily., Disp: 90 capsule, Rfl: 1   valsartan (DIOVAN) 80 MG tablet, Take 1 tablet (80 mg total) by mouth daily., Disp: 90 tablet, Rfl: 0  Social History   Tobacco Use  Smoking Status Former   Current packs/day: 0.00   Average packs/day: 1.5 packs/day for 18.0 years (27.0 ttl pk-yrs)   Types: Cigarettes   Start date: 10/10/1987   Quit date: 10/09/2005   Years since quitting: 17.8  Smokeless Tobacco Never    No Known Allergies Objective:  There  were no vitals filed for this visit. There is no height or weight on file to calculate BMI. Constitutional Well developed. Well nourished.  Vascular Dorsalis pedis pulses palpable bilaterally. Posterior tibial pulses palpable bilaterally. Capillary refill normal to all digits.  No cyanosis or clubbing noted. Pedal hair growth normal.  Neurologic Normal speech. Oriented to person, place, and time. Epicritic sensation to light touch grossly present bilaterally.  Dermatologic Nails well groomed and normal in appearance. No open wounds. No skin lesions.  Orthopedic: Pain on palpation of right second metatarsophalangeal joint pain with range of motion of the joint no deep intra-articular pain noted.  No pain at the respective metatarsophalangeal joint.  No extensor or flexor tendinitis noted   Radiographs: None Assessment:   1. Capsulitis of metatarsophalangeal (MTP) joint of right foot     Plan:  Patient was evaluated and treated and all questions answered.  Right second metatarsophalangeal joint capsulitis -All questions and concerns were discussed with the patient in extensive detail given the amount of pain that is having a benefit from steroid injection help decrease acute inflammatory component associated pain.  Patient agrees with plan like to proceed with steroid injection -A second steroid injection was performed at right second MTP using 1% plain Lidocaine and 10 mg of Kenalog. This was well tolerated. -I discussed shoe gear modification -Also  discussed metatarsal pads and may benefit from orthotics in the future  No follow-ups on file.

## 2023-08-16 DIAGNOSIS — H8101 Meniere's disease, right ear: Secondary | ICD-10-CM | POA: Diagnosis not present

## 2023-08-16 DIAGNOSIS — Z01118 Encounter for examination of ears and hearing with other abnormal findings: Secondary | ICD-10-CM | POA: Diagnosis not present

## 2023-08-16 DIAGNOSIS — H9311 Tinnitus, right ear: Secondary | ICD-10-CM | POA: Diagnosis not present

## 2023-08-16 DIAGNOSIS — H90A21 Sensorineural hearing loss, unilateral, right ear, with restricted hearing on the contralateral side: Secondary | ICD-10-CM | POA: Diagnosis not present

## 2023-08-16 DIAGNOSIS — H832X1 Labyrinthine dysfunction, right ear: Secondary | ICD-10-CM | POA: Diagnosis not present

## 2023-08-16 DIAGNOSIS — Z79899 Other long term (current) drug therapy: Secondary | ICD-10-CM | POA: Diagnosis not present

## 2023-08-16 DIAGNOSIS — H903 Sensorineural hearing loss, bilateral: Secondary | ICD-10-CM | POA: Diagnosis not present

## 2023-08-16 DIAGNOSIS — H9193 Unspecified hearing loss, bilateral: Secondary | ICD-10-CM | POA: Diagnosis not present

## 2023-08-29 ENCOUNTER — Other Ambulatory Visit: Payer: Self-pay | Admitting: Family Medicine

## 2023-09-07 ENCOUNTER — Other Ambulatory Visit: Payer: Self-pay | Admitting: Family Medicine

## 2023-09-25 ENCOUNTER — Other Ambulatory Visit: Payer: Self-pay | Admitting: Family Medicine

## 2023-09-25 NOTE — Telephone Encounter (Signed)
Requested Prescriptions   Pending Prescriptions Disp Refills   traZODone (DESYREL) 50 MG tablet [Pharmacy Med Name: traZODone HCl 50 MG Oral Tablet] 30 tablet 0    Sig: TAKE 1/2 TO 1 (ONE-HALF TO ONE) TABLET BY MOUTH AT BEDTIME AS NEEDED FOR SLEEP     Date of patient request: 09/25/2023 Last office visit: 06/06/2023 Upcoming visit: 12/27/2023 Date of last refill: 08/29/2023 Last refill amount: 30

## 2023-10-08 ENCOUNTER — Other Ambulatory Visit: Payer: Self-pay | Admitting: Family Medicine

## 2023-12-06 ENCOUNTER — Other Ambulatory Visit: Payer: Self-pay | Admitting: Family Medicine

## 2023-12-21 ENCOUNTER — Other Ambulatory Visit: Payer: Self-pay | Admitting: Family Medicine

## 2023-12-27 ENCOUNTER — Encounter: Payer: Self-pay | Admitting: Family Medicine

## 2023-12-27 ENCOUNTER — Ambulatory Visit (INDEPENDENT_AMBULATORY_CARE_PROVIDER_SITE_OTHER): Payer: 59 | Admitting: Family Medicine

## 2023-12-27 VITALS — BP 124/82 | HR 78 | Temp 98.0°F | Ht 71.0 in | Wt 306.2 lb

## 2023-12-27 DIAGNOSIS — Z0000 Encounter for general adult medical examination without abnormal findings: Secondary | ICD-10-CM | POA: Diagnosis not present

## 2023-12-27 DIAGNOSIS — Z114 Encounter for screening for human immunodeficiency virus [HIV]: Secondary | ICD-10-CM

## 2023-12-27 DIAGNOSIS — Z125 Encounter for screening for malignant neoplasm of prostate: Secondary | ICD-10-CM | POA: Diagnosis not present

## 2023-12-27 DIAGNOSIS — Z1159 Encounter for screening for other viral diseases: Secondary | ICD-10-CM

## 2023-12-27 LAB — LIPID PANEL
Cholesterol: 163 mg/dL (ref 0–200)
HDL: 64.6 mg/dL (ref >39.00)
LDL Cholesterol: 81 mg/dL (ref 0–99)
NonHDL: 98.82
Total CHOL/HDL Ratio: 3
Triglycerides: 88 mg/dL (ref 0.0–149.0)
VLDL: 17.6 mg/dL (ref 0.0–40.0)

## 2023-12-27 LAB — BASIC METABOLIC PANEL
BUN: 18 mg/dL (ref 6–23)
CO2: 33 meq/L — ABNORMAL HIGH (ref 19–32)
Calcium: 10.1 mg/dL (ref 8.4–10.5)
Chloride: 98 meq/L (ref 96–112)
Creatinine, Ser: 1.13 mg/dL (ref 0.40–1.50)
GFR: 76.09 mL/min (ref >60.00)
Glucose, Bld: 97 mg/dL (ref 70–99)
Potassium: 4.6 meq/L (ref 3.5–5.1)
Sodium: 138 meq/L (ref 135–145)

## 2023-12-27 LAB — HEPATIC FUNCTION PANEL
ALT: 11 U/L (ref 0–53)
AST: 18 U/L (ref 0–37)
Albumin: 4.9 g/dL (ref 3.5–5.2)
Alkaline Phosphatase: 63 U/L (ref 39–117)
Bilirubin, Direct: 0.2 mg/dL (ref 0.0–0.3)
Total Bilirubin: 1 mg/dL (ref 0.2–1.2)
Total Protein: 7.4 g/dL (ref 6.0–8.3)

## 2023-12-27 LAB — CBC WITH DIFFERENTIAL/PLATELET
Basophils Absolute: 0.1 10*3/uL (ref 0.0–0.1)
Basophils Relative: 0.8 % (ref 0.0–3.0)
Eosinophils Absolute: 0.1 10*3/uL (ref 0.0–0.7)
Eosinophils Relative: 1.9 % (ref 0.0–5.0)
HCT: 44.8 % (ref 39.0–52.0)
Hemoglobin: 15.6 g/dL (ref 13.0–17.0)
Lymphocytes Relative: 20.3 % (ref 12.0–46.0)
Lymphs Abs: 1.5 10*3/uL (ref 0.7–4.0)
MCHC: 34.8 g/dL (ref 30.0–36.0)
MCV: 96.2 fl (ref 78.0–100.0)
Monocytes Absolute: 0.6 10*3/uL (ref 0.1–1.0)
Monocytes Relative: 8.7 % (ref 3.0–12.0)
Neutro Abs: 5 10*3/uL (ref 1.4–7.7)
Neutrophils Relative %: 68.3 % (ref 43.0–77.0)
Platelets: 200 10*3/uL (ref 150.0–400.0)
RBC: 4.66 Mil/uL (ref 4.22–5.81)
RDW: 13 % (ref 11.5–15.5)
WBC: 7.3 10*3/uL (ref 4.0–10.5)

## 2023-12-27 LAB — PSA: PSA: 0.61 ng/mL (ref 0.10–4.00)

## 2023-12-27 LAB — HEMOGLOBIN A1C: Hgb A1c MFr Bld: 5.3 % (ref 4.6–6.5)

## 2023-12-27 LAB — TSH: TSH: 2.38 u[IU]/mL (ref 0.35–5.50)

## 2023-12-27 NOTE — Progress Notes (Signed)
   Subjective:    Patient ID: Rodney Mcclain, male    DOB: Dec 12, 1975, 48 y.o.   MRN: [id number]  HPI CPE- UTD on colonoscopy, Tdap.  No concerns  Patient Care Team    Relationship Specialty Notifications Start End  Sheliah Hatch, MD PCP - General Family Medicine  04/07/11     Health Maintenance  Topic Date Due   HIV Screening  Never done   Hepatitis C Screening  Never done   COVID-19 Vaccine (3 - Pfizer risk series) 12/25/2020   INFLUENZA VACCINE  05/10/2023   Colonoscopy  02/01/2025   DTaP/Tdap/Td (3 - Td or Tdap) 10/11/2027   HPV VACCINES  Aged Out      Review of Systems Patient reports no vision/hearing changes, anorexia, fever ,adenopathy, persistant/recurrent hoarseness, swallowing issues, chest pain, palpitations, edema, persistant/recurrent cough, hemoptysis, dyspnea (rest,exertional, paroxysmal nocturnal), gastrointestinal  bleeding (melena, rectal bleeding), abdominal pain, excessive heart burn, GU symptoms (dysuria, hematuria, voiding/incontinence issues) syncope, focal weakness, memory loss, numbness & tingling, skin/hair/nail changes, depression, anxiety, abnormal bruising/bleeding, musculoskeletal symptoms/signs.     Objective:   Physical Exam General Appearance:    Alert, cooperative, no distress, appears stated age, obese  Head:    Normocephalic, without obvious abnormality, atraumatic  Eyes:    PERRL, conjunctiva/corneas clear, EOM's intact both eyes       Ears:    Normal TM's and external ear canals, both ears  Nose:   Nares normal, septum midline, mucosa normal, no drainage   or sinus tenderness  Throat:   Lips, mucosa, and tongue normal; teeth and gums normal  Neck:   Supple, symmetrical, trachea midline, no adenopathy;       thyroid:  No enlargement/tenderness/nodules  Back:     Symmetric, no curvature, ROM normal, no CVA tenderness  Lungs:     Clear to auscultation bilaterally, respirations unlabored  Chest wall:    No tenderness or deformity  Heart:     Regular rate and rhythm, S1 and S2 normal, no murmur, rub   or gallop  Abdomen:     Soft, non-tender, bowel sounds active all four quadrants,    no masses, no organomegaly  Genitalia:    deferred  Rectal:    Extremities:   Extremities normal, atraumatic, no cyanosis or edema  Pulses:   2+ and symmetric all extremities  Skin:   Skin color, texture, turgor normal, no rashes or lesions  Lymph nodes:   Cervical, supraclavicular, and axillary nodes normal  Neurologic:   CNII-XII intact. Normal strength, sensation and reflexes      throughout          Assessment & Plan:

## 2023-12-27 NOTE — Assessment & Plan Note (Signed)
 Ongoing issue for pt.  Discussed need for low carb diet and regular physical activity.  Check labs to risk stratify.  Will follow.

## 2023-12-27 NOTE — Patient Instructions (Signed)
 Follow up in 6 months to recheck BP and cholesterol We'll notify you of your lab results and make any changes if needed Continue to work on low carb diet and regular exercise Call with any questions or concerns Stay Safe!  Stay Healthy! Happy Early [place] Ouch!!

## 2023-12-27 NOTE — Assessment & Plan Note (Signed)
 Pt's PE WNL w/ exception of BMI.  UTD on colonoscopy and Tdap.  Check labs.  Anticipatory guidance provided.

## 2023-12-28 ENCOUNTER — Encounter: Payer: Self-pay | Admitting: Family Medicine

## 2023-12-28 LAB — HEPATITIS C ANTIBODY: Hepatitis C Ab: NONREACTIVE

## 2023-12-28 LAB — HIV ANTIBODY (ROUTINE TESTING W REFLEX): HIV 1&2 Ab, 4th Generation: NONREACTIVE

## 2023-12-28 NOTE — Telephone Encounter (Signed)
 Patient has concerns about labs. Please advise

## 2024-01-02 ENCOUNTER — Other Ambulatory Visit: Payer: Self-pay | Admitting: Family Medicine

## 2024-01-02 DIAGNOSIS — E039 Hypothyroidism, unspecified: Secondary | ICD-10-CM

## 2024-01-04 ENCOUNTER — Other Ambulatory Visit: Payer: Self-pay | Admitting: Family Medicine

## 2024-01-31 ENCOUNTER — Ambulatory Visit: Admitting: Podiatry

## 2024-01-31 ENCOUNTER — Encounter: Payer: Self-pay | Admitting: Podiatry

## 2024-01-31 DIAGNOSIS — M778 Other enthesopathies, not elsewhere classified: Secondary | ICD-10-CM

## 2024-01-31 DIAGNOSIS — M7751 Other enthesopathy of right foot: Secondary | ICD-10-CM | POA: Diagnosis not present

## 2024-01-31 MED ORDER — COLCHICINE 0.6 MG PO TABS: 0.6000 mg | ORAL_TABLET | Freq: Every day | ORAL | 0 refills | Status: DC

## 2024-01-31 MED ORDER — TRIAMCINOLONE ACETONIDE 10 MG/ML IJ SUSP
2.5000 mg | Freq: Once | INTRAMUSCULAR | Status: AC
  Administered 2024-01-31: 2.5 mg via INTRA_ARTICULAR

## 2024-01-31 MED ORDER — DEXAMETHASONE SODIUM PHOSPHATE 120 MG/30ML IJ SOLN
4.0000 mg | Freq: Once | INTRAMUSCULAR | Status: AC
  Administered 2024-01-31: 4 mg via INTRA_ARTICULAR

## 2024-01-31 NOTE — Progress Notes (Signed)
  Subjective:  Patient ID: Rodney Mcclain, male    DOB: [birth year],   MRN: [id number]  No chief complaint on file.   48 y.o. male presents for concern of pain in his right second and great toe .Has a history of gout that flares up in his right great toe and this causes him to then put more pressure on his second and then that flarees up. Relates tends to flare after certain shoes. Relates he takes indomethacin  and usually that helps and this time has not been improving although some better. Has had an injection in past with Dr. Lydia Sams last one was 6 months ago.  Denies any other pedal complaints. Denies n/v/f/c.   Past Medical History:  Diagnosis Date   Anal fissure    Anxiety and depression    Depression    Diverticulitis    Gout    Hypertension    Obesity    OSA on CPAP     Objective:  Physical Exam: Vascular: DP/PT pulses 2/4 bilateral. CFT <3 seconds. Normal hair growth on digits. No edema.  Skin. No lacerations or abrasions bilateral feet.  Musculoskeletal: MMT 5/5 bilateral lower extremities in DF, PF, Inversion and Eversion. Deceased ROM in DF of ankle joint. Tender to hallux IPJ and pain with ROM but minimal. Most pain to plantar second metatarsophalangeal joint and pain with ROM of the second digit. Mild erythema and edema to right hallux.  Neurological: Sensation intact to light touch.   Assessment:   1. Capsulitis of metatarsophalangeal (MTP) joint of right foot   2. Capsulitis of ankle, left      Plan:  Patient was evaluated and treated and all questions answered. -Xrays reviewed from previous. No acute fractures or dislocations  -Discussed treatement options for gouty arthritis and gout education provided. -injection offered procedure below.  -Discussed diet and modifications.  -Rx Colchicine  0.6mg  -Advised patient to call if symptoms are not improved within 1 week -Patient to return as needed  Procedure: Injection Tendon/Ligament Discussed alternatives, risks,  complications and verbal consent was obtained.  Location: Right first hallux interphalangeal joint and right second metatarsophalangeal joint. Skin Prep: Alcohol. Injectate: 1cc 0.5% marcaine  plain, 1 cc dexamethasone  0.5 cc kenalog   Disposition: Patient tolerated procedure well. Injection site dressed with a band-aid.  Post-injection care was discussed and return precautions discussed.     Jennefer Moats, DPM

## 2024-03-02 ENCOUNTER — Other Ambulatory Visit: Payer: Self-pay | Admitting: Family Medicine

## 2024-03-19 ENCOUNTER — Other Ambulatory Visit: Payer: Self-pay | Admitting: Family Medicine

## 2024-03-30 ENCOUNTER — Other Ambulatory Visit: Payer: Self-pay | Admitting: Family Medicine

## 2024-03-30 DIAGNOSIS — E039 Hypothyroidism, unspecified: Secondary | ICD-10-CM

## 2024-04-02 ENCOUNTER — Other Ambulatory Visit: Payer: Self-pay | Admitting: Family Medicine

## 2024-04-04 DIAGNOSIS — H2513 Age-related nuclear cataract, bilateral: Secondary | ICD-10-CM | POA: Diagnosis not present

## 2024-04-04 DIAGNOSIS — H40033 Anatomical narrow angle, bilateral: Secondary | ICD-10-CM | POA: Diagnosis not present

## 2024-05-28 ENCOUNTER — Other Ambulatory Visit: Payer: Self-pay | Admitting: Family Medicine

## 2024-06-13 ENCOUNTER — Other Ambulatory Visit: Payer: Self-pay | Admitting: Family Medicine

## 2024-06-13 NOTE — Telephone Encounter (Signed)
 Requested Prescriptions   Pending Prescriptions Disp Refills   traZODone  (DESYREL ) 50 MG tablet [Pharmacy Med Name: traZODone  HCl 50 MG Oral Tablet] 90 tablet 0    Sig: TAKE 1/2 TO 1 (ONE-HALF TO ONE) TABLET BY MOUTH AT BEDTIME AS NEEDED FOR SLEEP     Date of patient request: 06/13/24 Last office visit: 12/27/2023 Upcoming visit: 07/11/2024 Date of last refill: 03/19/2024 Last refill amount: 90

## 2024-06-27 ENCOUNTER — Ambulatory Visit: Admitting: Family Medicine

## 2024-06-27 ENCOUNTER — Other Ambulatory Visit: Payer: Self-pay | Admitting: Family Medicine

## 2024-07-11 ENCOUNTER — Encounter: Payer: Self-pay | Admitting: Family Medicine

## 2024-07-11 ENCOUNTER — Ambulatory Visit: Admitting: Family Medicine

## 2024-07-11 VITALS — BP 114/76 | HR 87 | Temp 97.8°F | Resp 14 | Ht 71.0 in | Wt 317.4 lb

## 2024-07-11 DIAGNOSIS — I10 Essential (primary) hypertension: Secondary | ICD-10-CM | POA: Diagnosis not present

## 2024-07-11 DIAGNOSIS — R058 Other specified cough: Secondary | ICD-10-CM

## 2024-07-11 DIAGNOSIS — Z23 Encounter for immunization: Secondary | ICD-10-CM | POA: Diagnosis not present

## 2024-07-11 DIAGNOSIS — E039 Hypothyroidism, unspecified: Secondary | ICD-10-CM

## 2024-07-11 DIAGNOSIS — E785 Hyperlipidemia, unspecified: Secondary | ICD-10-CM

## 2024-07-11 MED ORDER — SIMVASTATIN 40 MG PO TABS: 40.0000 mg | ORAL_TABLET | Freq: Every day | ORAL | 3 refills | Status: AC

## 2024-07-11 NOTE — Progress Notes (Signed)
   Subjective:    Patient ID: Rodney Mcclain, male    DOB: July 05, 1976, 48 y.o.   MRN: [id number]  HPI HTN- chronic problem, on Valsartan  80mg  daily, Triamterene  hydrochlorothiazide 37.5/25mg  daily.  No CP, SOB, HA's, visual changes, edema.  Hyperlipidemia- chronic problem, on Simvastatin  40mg  daily.  No abd pain, N/V.  Hypothyroid- chronic problem, on Levothyroxine  125mcg daily.  No changes to skin/hair/nails.  Obesity- pt has gained 11 lbs since March.  BMI now 44.27  Very active at work, no formal exercise  Chronic cough- pt reports that each morning he will wake up w/ a dry, hacking cough.  Once this clears, he doesn't cough the rest of the day.  Took Zyrtec regularly for months w/o improvement.   Review of Systems For ROS see HPI     Objective:   Physical Exam Vitals reviewed.  Constitutional:      General: He is not in acute distress.    Appearance: Normal appearance. He is well-developed. He is obese. He is not ill-appearing.  HENT:     Head: Normocephalic and atraumatic.  Eyes:     Extraocular Movements: Extraocular movements intact.     Conjunctiva/sclera: Conjunctivae normal.     Pupils: Pupils are equal, round, and reactive to light.  Neck:     Thyroid : No thyromegaly.  Cardiovascular:     Rate and Rhythm: Normal rate and regular rhythm.     Pulses: Normal pulses.     Heart sounds: Normal heart sounds. No murmur heard. Pulmonary:     Effort: Pulmonary effort is normal. No respiratory distress.     Breath sounds: Normal breath sounds.  Abdominal:     General: Bowel sounds are normal. There is no distension.     Palpations: Abdomen is soft.  Musculoskeletal:     Cervical back: Normal range of motion and neck supple.     Right lower leg: No edema.     Left lower leg: No edema.  Lymphadenopathy:     Cervical: No cervical adenopathy.  Skin:    General: Skin is warm and dry.  Neurological:     General: No focal deficit present.     Mental Status: He is alert and  oriented to person, place, and time.     Cranial Nerves: No cranial nerve deficit.  Psychiatric:        Mood and Affect: Mood normal.        Behavior: Behavior normal.           Assessment & Plan:  Morning cough- new.  Pt reports this occurs daily and did not change when taking allergy medication for multiple months.  Suspect overnight drainage and airway inflammation.  Will get CXR to assess.  If CXR is normal, will try ICS nightly and see if sxs improve.  Pt expressed understanding and is in agreement w/ plan.

## 2024-07-11 NOTE — Assessment & Plan Note (Signed)
 Deteriorated.  Pt is up 11 lbs since March.  Has a very active job and gets plenty of steps but no formal exercise.  Encouraged low carb diet.  Will follow.

## 2024-07-11 NOTE — Patient Instructions (Signed)
 Schedule your complete physical in 6 months We'll notify you of your lab results and make any changes if needed Go to [facility] and get your chest xray done Continue to work on healthy diet and regular exercise- you can do it! Get your shingles vaccine at any time at the pharmacy Call with any questions or concerns Stay Safe!  Stay Healthy! Have a great weekend!

## 2024-07-11 NOTE — Assessment & Plan Note (Signed)
 Chronic problem.  On Simvastatin  40mg  daily w/o difficulty.  Check labs.  Adjust meds prn

## 2024-07-11 NOTE — Assessment & Plan Note (Signed)
Chronic problem.  Currently asymptomatic.  Check labs.  Adjust meds prn  

## 2024-07-11 NOTE — Assessment & Plan Note (Signed)
 Chronic problem.  Currently well controlled on Valsartan , Triamterene  hydrochlorothiazide daily.  Asymptomatic.  Check labs due to diuretic and ARB use but no anticipated med changes.

## 2024-07-12 LAB — CBC WITH DIFFERENTIAL/PLATELET
Absolute Lymphocytes: 1663 {cells}/uL (ref 850–3900)
Absolute Monocytes: 593 {cells}/uL (ref 200–950)
Basophils Absolute: 62 {cells}/uL (ref 0–200)
Basophils Relative: 0.9 %
Eosinophils Absolute: 331 {cells}/uL (ref 15–500)
Eosinophils Relative: 4.8 %
HCT: 43.4 % (ref 38.5–50.0)
Hemoglobin: 14.8 g/dL (ref 13.2–17.1)
MCH: 32.7 pg (ref 27.0–33.0)
MCHC: 34.1 g/dL (ref 32.0–36.0)
MCV: 95.8 fL (ref 80.0–100.0)
MPV: 9.2 fL (ref 7.5–12.5)
Monocytes Relative: 8.6 %
Neutro Abs: 4250 {cells}/uL (ref 1500–7800)
Neutrophils Relative %: 61.6 %
Platelets: 176 Thousand/uL (ref 140–400)
RBC: 4.53 Million/uL (ref 4.20–5.80)
RDW: 12.3 % (ref 11.0–15.0)
Total Lymphocyte: 24.1 %
WBC: 6.9 Thousand/uL (ref 3.8–10.8)

## 2024-07-12 LAB — LIPID PANEL
Cholesterol: 157 mg/dL (ref <200)
HDL: 73 mg/dL (ref >=40)
LDL Cholesterol (Calc): 60 mg/dL
Non-HDL Cholesterol (Calc): 84 mg/dL (ref <130)
Total CHOL/HDL Ratio: 2.2 (calc) (ref <5.0)
Triglycerides: 164 mg/dL — ABNORMAL HIGH (ref <150)

## 2024-07-12 LAB — HEPATIC FUNCTION PANEL
AG Ratio: 2.1 (calc) (ref 1.0–2.5)
ALT: 12 U/L (ref 9–46)
AST: 17 U/L (ref 10–35)
Albumin: 4.7 g/dL (ref 3.6–5.1)
Alkaline phosphatase (APISO): 55 U/L (ref 35–144)
Bilirubin, Direct: 0.2 mg/dL (ref 0.0–0.2)
Globulin: 2.2 g/dL (ref 1.9–3.7)
Indirect Bilirubin: 0.5 mg/dL (ref 0.2–1.2)
Total Bilirubin: 0.7 mg/dL (ref 0.2–1.2)
Total Protein: 6.9 g/dL (ref 6.1–8.1)

## 2024-07-12 LAB — BASIC METABOLIC PANEL WITH GFR
BUN: 19 mg/dL (ref 7–25)
CO2: 31 mmol/L (ref 20–32)
Calcium: 9.5 mg/dL (ref 8.6–10.3)
Chloride: 101 mmol/L (ref 98–110)
Creat: 1.12 mg/dL (ref 0.70–1.30)
Glucose, Bld: 90 mg/dL (ref 65–99)
Potassium: 4 mmol/L (ref 3.5–5.3)
Sodium: 140 mmol/L (ref 135–146)
eGFR: 80 mL/min/1.73m2 (ref >=60)

## 2024-07-12 LAB — TSH: TSH: 3.23 m[IU]/L (ref 0.40–4.50)

## 2024-07-14 ENCOUNTER — Ambulatory Visit (HOSPITAL_BASED_OUTPATIENT_CLINIC_OR_DEPARTMENT_OTHER)
Admission: RE | Admit: 2024-07-14 | Discharge: 2024-07-14 | Disposition: A | Source: Ambulatory Visit | Attending: Family Medicine | Admitting: Family Medicine

## 2024-07-14 ENCOUNTER — Ambulatory Visit: Payer: Self-pay | Admitting: Family Medicine

## 2024-07-14 DIAGNOSIS — R058 Other specified cough: Secondary | ICD-10-CM | POA: Diagnosis not present

## 2024-07-14 NOTE — Progress Notes (Signed)
 Pt has reviewed labs via MyChart

## 2024-07-15 MED ORDER — BECLOMETHASONE DIPROP HFA 40 MCG/ACT IN AERB: 1.0000 | INHALATION_SPRAY | Freq: Two times a day (BID) | RESPIRATORY_TRACT | 3 refills | Status: DC

## 2024-07-15 NOTE — Telephone Encounter (Signed)
 Patient called back and was informed.

## 2024-07-15 NOTE — Progress Notes (Signed)
 Called patient to relay message from Dr. Mahlon. Left vm to return call

## 2024-07-16 MED ORDER — BECLOMETHASONE DIPROP HFA 40 MCG/ACT IN AERB: 1.0000 | INHALATION_SPRAY | Freq: Two times a day (BID) | RESPIRATORY_TRACT | 3 refills | Status: DC

## 2024-07-16 NOTE — Addendum Note (Signed)
 Addended by: Leotha Westermeyer K on: 07/16/2024 12:28 PM   Modules accepted: Orders

## 2024-07-16 NOTE — Telephone Encounter (Signed)
 Patient asked that the medication be re-sent to [facility] as it is covered at this pharmacy this has been sent to this location and patient has been informed

## 2024-07-18 ENCOUNTER — Telehealth: Payer: Self-pay

## 2024-07-18 MED ORDER — BECLOMETHASONE DIPROP HFA 40 MCG/ACT IN AERB: 1.0000 | INHALATION_SPRAY | Freq: Two times a day (BID) | RESPIRATORY_TRACT | 0 refills | Status: AC

## 2024-07-18 NOTE — Telephone Encounter (Signed)
 Sent 90D supply of inhaler to [facility] in [place]

## 2024-08-27 ENCOUNTER — Encounter: Payer: Self-pay | Admitting: Family Medicine

## 2024-08-28 MED ORDER — LEVOTHYROXINE SODIUM 125 MCG PO TABS: 125.0000 ug | ORAL_TABLET | Freq: Every day | ORAL | 0 refills | Status: DC

## 2024-08-28 MED ORDER — TRAZODONE HCL 50 MG PO TABS
50.0000 mg | ORAL_TABLET | Freq: Every day | ORAL | 1 refills | Status: AC
Start: 2024-08-28 — End: ?

## 2024-08-28 MED ORDER — LEVOTHYROXINE SODIUM 125 MCG PO TABS: 125.0000 ug | ORAL_TABLET | Freq: Every day | ORAL | 1 refills | Status: AC

## 2024-08-28 NOTE — Telephone Encounter (Signed)
Please advise if ok to refill. 

## 2024-09-18 ENCOUNTER — Other Ambulatory Visit: Payer: Self-pay

## 2024-09-18 ENCOUNTER — Emergency Department (HOSPITAL_COMMUNITY)
Admission: EM | Admit: 2024-09-18 | Discharge: 2024-09-19 | Disposition: A | Discharge status: Home / Self Care | Attending: Emergency Medicine | Admitting: Emergency Medicine

## 2024-09-18 DIAGNOSIS — I10 Essential (primary) hypertension: Secondary | ICD-10-CM | POA: Insufficient documentation

## 2024-09-18 DIAGNOSIS — R197 Diarrhea, unspecified: Secondary | ICD-10-CM | POA: Diagnosis present

## 2024-09-18 DIAGNOSIS — Z79899 Other long term (current) drug therapy: Secondary | ICD-10-CM | POA: Diagnosis not present

## 2024-09-18 DIAGNOSIS — R1084 Generalized abdominal pain: Secondary | ICD-10-CM | POA: Insufficient documentation

## 2024-09-18 LAB — COMPREHENSIVE METABOLIC PANEL WITH GFR
ALT: 16 U/L (ref 0–44)
AST: 30 U/L (ref 15–41)
Albumin: 4.5 g/dL (ref 3.5–5.0)
Alkaline Phosphatase: 66 U/L (ref 38–126)
Anion gap: 12 (ref 5–15)
BUN: 20 mg/dL (ref 6–20)
CO2: 26 mmol/L (ref 22–32)
Calcium: 9.6 mg/dL (ref 8.9–10.3)
Chloride: 98 mmol/L (ref 98–111)
Creatinine, Ser: 1.03 mg/dL (ref 0.61–1.24)
GFR, Estimated: >60 mL/min (ref >60)
Glucose, Bld: 87 mg/dL (ref 70–99)
Potassium: 3.6 mmol/L (ref 3.5–5.1)
Sodium: 136 mmol/L (ref 135–145)
Total Bilirubin: 0.7 mg/dL (ref 0.0–1.2)
Total Protein: 7.3 g/dL (ref 6.5–8.1)

## 2024-09-18 LAB — CBC
HCT: 44.3 % (ref 39.0–52.0)
Hemoglobin: 15.3 g/dL (ref 13.0–17.0)
MCH: 32.6 pg (ref 26.0–34.0)
MCHC: 34.5 g/dL (ref 30.0–36.0)
MCV: 94.3 fL (ref 80.0–100.0)
Platelets: 184 K/uL (ref 150–400)
RBC: 4.7 MIL/uL (ref 4.22–5.81)
RDW: 12.7 % (ref 11.5–15.5)
WBC: 7.6 K/uL (ref 4.0–10.5)
nRBC: 0 % (ref 0.0–0.2)

## 2024-09-18 LAB — TROPONIN T, HIGH SENSITIVITY
Troponin T High Sensitivity: <15 ng/L (ref 0–19)
Troponin T High Sensitivity: <15 ng/L (ref 0–19)

## 2024-09-18 LAB — LIPASE, BLOOD: Lipase: 43 U/L (ref 11–51)

## 2024-09-18 NOTE — ED Triage Notes (Signed)
 Patient c/o Generalized abdomnial pain x 5 day. Patient denies N/V. Patient report diarrhea last Sunday and Monday. Patient denies chest pain and SOB.

## 2024-09-19 ENCOUNTER — Emergency Department (HOSPITAL_COMMUNITY)

## 2024-09-19 LAB — URINALYSIS, ROUTINE W REFLEX MICROSCOPIC
Bacteria, UA: NONE SEEN
Bilirubin Urine: NEGATIVE
Glucose, UA: NEGATIVE mg/dL
Hgb urine dipstick: NEGATIVE
Ketones, ur: NEGATIVE mg/dL
Nitrite: NEGATIVE
Protein, ur: NEGATIVE mg/dL
Specific Gravity, Urine: <1.005 — ABNORMAL LOW (ref 1.005–1.030)
pH: 6 (ref 5.0–8.0)

## 2024-09-19 MED ORDER — FAMOTIDINE IN NACL 20-0.9 MG/50ML-% IV SOLN
20.0000 mg | Freq: Once | INTRAVENOUS | Status: AC
  Administered 2024-09-19: 20 mg via INTRAVENOUS
  Filled 2024-09-19: qty 50

## 2024-09-19 MED ORDER — IOHEXOL 300 MG/ML  SOLN
100.0000 mL | Freq: Once | INTRAMUSCULAR | Status: AC | PRN
  Administered 2024-09-19: 100 mL via INTRAVENOUS

## 2024-09-19 MED ORDER — FAMOTIDINE 20 MG PO TABS: 20.0000 mg | ORAL_TABLET | Freq: Two times a day (BID) | ORAL | 0 refills | Status: DC

## 2024-09-19 MED ORDER — PANTOPRAZOLE SODIUM 20 MG PO TBEC: 20.0000 mg | DELAYED_RELEASE_TABLET | Freq: Every day | ORAL | 0 refills | Status: DC

## 2024-09-19 MED ORDER — ALUM & MAG HYDROXIDE-SIMETH 200-200-20 MG/5ML PO SUSP
30.0000 mL | Freq: Once | ORAL | Status: AC
  Administered 2024-09-19: 30 mL via ORAL
  Filled 2024-09-19: qty 30

## 2024-09-19 MED ORDER — PANTOPRAZOLE SODIUM 40 MG IV SOLR
40.0000 mg | Freq: Once | INTRAVENOUS | Status: AC
  Administered 2024-09-19: 40 mg via INTRAVENOUS
  Filled 2024-09-19: qty 10

## 2024-09-19 NOTE — ED Provider Notes (Signed)
 [place] EMERGENCY DEPARTMENT AT [facility] Provider Note   CSN: [id number] Arrival date & time: 09/18/24  1839     Patient presents with: Abdominal Pain   Rodney Mcclain is a 48 y.o. male.   Abdominal Pain Associated symptoms: diarrhea    Patient is a 48 year old male presenting ED today for concerns for generalized abdominal pain that has been ongoing x 5 days, coming in waves with additionally noting that he has had some watery diarrhea.  Previous medical history of diverticulitis, HTN, anal fissure, Mnire's disease,, thyroid  disease, gout.  Noted that pain was worse shortly after eating approximately 10 to 15 minutes, taking Gas-X without relief.  Notes that this pain is worse than when he had diverticulitis previously.  Feeling similar.  Was seen earlier today by urgent care and was told come to the emergency department for  CT.  Since arriving to the emergency department patient noted that his pain had significantly improved but still persistent.  Denies fever, headache, vision changes, chest pain, shortness of breath, nausea, vomiting, melena, hematochezia, hematuria, dysuria, rashes, lower leg swelling.    Prior to Admission medications  Medication Sig Start Date End Date Taking? Authorizing Provider  famotidine (PEPCID) 20 MG tablet Take 1 tablet (20 mg total) by mouth 2 (two) times daily. 09/19/24  Yes Rocky Rishel S, PA-C  pantoprazole  (PROTONIX ) 20 MG tablet Take 1 tablet (20 mg total) by mouth daily. 09/19/24  Yes Iowa Kappes S, PA-C  beclomethasone (QVAR) 40 MCG/ACT inhaler Inhale 1 puff into the lungs 2 (two) times daily. 07/18/24   Tabori, [name], MD  indomethacin  (INDOCIN ) 50 MG capsule Take 1 capsule (50 mg total) by mouth 3 (three) times daily as needed. 12/05/22   Tabori, [name], MD  levothyroxine  (SYNTHROID ) 125 MCG tablet Take 1 tablet (125 mcg total) by mouth daily. 08/28/24   Tabori, [name], MD  Multiple Vitamin (MULTIVITAMIN)  tablet Take 1 tablet by mouth daily.    [provider]  simvastatin  (ZOCOR ) 40 MG tablet TAKE 1 TABLET BY MOUTH ONCE DAILY AT BEDTIME 03/31/24   Williamson, Joanna R, NP  simvastatin  (ZOCOR ) 40 MG tablet Take 1 tablet (40 mg total) by mouth at bedtime. 07/11/24   Tabori, [name], MD  traZODone  (DESYREL ) 50 MG tablet Take 1 tablet (50 mg total) by mouth at bedtime. 08/28/24   Tabori, [name], MD  triamterene -hydrochlorothiazide (DYAZIDE) 37.5-25 MG capsule Take 1 each (1 capsule total) by mouth daily. 05/16/22   Tabori, [name], MD  valsartan  (DIOVAN ) 80 MG tablet Take 1 tablet by mouth once daily 06/27/24   Tabori, [name], MD    Allergies: Patient has no known allergies.    Review of Systems  Gastrointestinal:  Positive for abdominal pain and diarrhea.  All other systems reviewed and are negative.   Updated Vital Signs BP (!) 149/89   Pulse 83   Temp 98 F (36.7 C) (Oral)   Resp 18   SpO2 98%   Physical Exam Vitals and nursing note reviewed.  Constitutional:      General: He is not in acute distress.    Appearance: Normal appearance. He is not ill-appearing or diaphoretic.  HENT:     Head: Normocephalic and atraumatic.  Eyes:     General: No scleral icterus.       Right eye: No discharge.        Left eye: No discharge.     Extraocular Movements: Extraocular movements intact.  Conjunctiva/sclera: Conjunctivae normal.  Cardiovascular:     Rate and Rhythm: Normal rate and regular rhythm.     Pulses: Normal pulses.     Heart sounds: Normal heart sounds. No murmur heard.    No friction rub. No gallop.  Pulmonary:     Effort: Pulmonary effort is normal. No respiratory distress.     Breath sounds: No stridor. No wheezing, rhonchi or rales.  Chest:     Chest wall: No tenderness.  Abdominal:     General: Abdomen is flat. There is no distension.     Palpations: Abdomen is soft.     Tenderness: There is generalized abdominal tenderness and tenderness in  the epigastric area. There is no right CVA tenderness, left CVA tenderness, guarding or rebound. Negative signs include Murphy's sign and McBurney's sign.  Musculoskeletal:        General: No swelling, deformity or signs of injury.     Cervical back: Normal range of motion. No rigidity.     Right lower leg: No edema.     Left lower leg: No edema.  Skin:    General: Skin is warm and dry.     Findings: No bruising, erythema or lesion.  Neurological:     General: No focal deficit present.     Mental Status: He is alert and oriented to person, place, and time. Mental status is at baseline.     Sensory: No sensory deficit.     Motor: No weakness.  Psychiatric:        Mood and Affect: Mood normal.     (all labs ordered are listed, but only abnormal results are displayed) Labs Reviewed  URINALYSIS, ROUTINE W REFLEX MICROSCOPIC - Abnormal; Notable for the following components:      Result Value   Specific Gravity, Urine <1.005 (*)    Leukocytes,Ua SMALL (*)    All other components within normal limits  LIPASE, BLOOD  COMPREHENSIVE METABOLIC PANEL WITH GFR  CBC  TROPONIN T, HIGH SENSITIVITY  TROPONIN T, HIGH SENSITIVITY    EKG: EKG Interpretation Date/Time:  Thursday September 18 2024 19:09:35 EST Ventricular Rate:  72 PR Interval:  173 QRS Duration:  97 QT Interval:  394 QTC Calculation: 432 R Axis:   16  Text Interpretation: Sinus rhythm Normal ECG When compared with ECG of 03/30/2022, No significant change was found Confirmed by Raford Lenis ([id number]) on 09/19/2024 12:40:46 AM  Radiology: CT ABDOMEN PELVIS W CONTRAST Result Date: 09/19/2024 EXAM: CT ABDOMEN AND PELVIS WITH CONTRAST 09/19/2024 02:26:19 AM TECHNIQUE: CT of the abdomen and pelvis was performed with the administration of 100 mL of iohexol  (OMNIPAQUE ) 300 MG/ML solution. Multiplanar reformatted images are provided for review. Automated exposure control, iterative reconstruction, and/or weight-based adjustment of the  mA/kV was utilized to reduce the radiation dose to as low as reasonably achievable. COMPARISON: None available. CLINICAL HISTORY: Abdominal pain, acute, nonlocalized. FINDINGS: LOWER CHEST: No acute abnormality. LIVER: The liver is unremarkable. GALLBLADDER AND BILE DUCTS: Gallbladder is unremarkable. No biliary ductal dilatation. SPLEEN: No acute abnormality. PANCREAS: No acute abnormality. ADRENAL GLANDS: No acute abnormality. KIDNEYS, URETERS AND BLADDER: No stones in the kidneys or ureters. No hydronephrosis. No perinephric or periureteral stranding. Urinary bladder is unremarkable. GI AND BOWEL: Colonic diverticulosis. No active diverticulitis. Stomach demonstrates no acute abnormality. There is no bowel obstruction. PERITONEUM AND RETROPERITONEUM: No ascites. No free air. VASCULATURE: Aorta is normal in caliber. LYMPH NODES: No lymphadenopathy. REPRODUCTIVE ORGANS: No acute abnormality. BONES AND SOFT TISSUES: No acute  osseous abnormality. No focal soft tissue abnormality. IMPRESSION: 1. No acute findings. 2. Colonic diverticulosis without diverticulitis. Electronically signed by: Franky Crease MD 09/19/2024 02:30 AM EST RP Workstation: [id number]    Procedures   Medications Ordered in the ED  pantoprazole  (PROTONIX ) injection 40 mg (40 mg Intravenous Given 09/19/24 0036)  famotidine (PEPCID) IVPB 20 mg premix (0 mg Intravenous Stopped 09/19/24 0114)  alum & mag hydroxide-simeth (MAALOX/MYLANTA) 200-200-20 MG/5ML suspension 30 mL (30 mLs Oral Given 09/19/24 0035)  iohexol  (OMNIPAQUE ) 300 MG/ML solution 100 mL (100 mLs Intravenous Contrast Given 09/19/24 0214)    Medical Decision Making Amount and/or Complexity of Data Reviewed Labs: ordered. Radiology: ordered.  Risk OTC drugs. Prescription drug management.  This patient is a 48 year old male who presents to the ED for concern of 5 days of generalized abdominal pain radiating to epigastric region and chest accompanied with diarrhea.   Reportedly seen by urgent care today and was told to come to the emergency room for CT scan.  Reported to have initially had much more severe symptoms but upon arriving to the ED, his noting he symptoms had greatly improved.  On physical exam, patient is in no acute distress, afebrile, alert and orient x 4, speaking in full sentences, nontachypneic, nontachycardic.  Notably does have some mild epigastric pain.  Negative Murphy sign, negative Rovsing sign, negative McBurney's point tenderness.  No CVA tenderness.  LCTAB, RRR, no murmur, no lower leg edema.  Unremarkable exam otherwise.  Patient has benign abdomen at this time however with patient's generalized abdominal pain radiating to epigastric region and chest, lab work was done to evaluate, with negative abdominal labs and negative troponin.  CT scan did show some diverticulosis without diverticulitis and no other acute abnormalities.  Suspicion for any emergent causes of patient symptoms, on reevaluation after GI cocktail, patient noted to have had symptoms greatly improved.  Will send home with these medications and have him follow-up with PCP.  Patient vital signs have remained stable throughout the course of patient's time in the ED. Low suspicion for any other emergent pathology at this time. I believe this patient is safe to be discharged. Provided strict return to ER precautions. Patient expressed agreement and understanding of plan. All questions were answered.  Differential diagnoses prior to evaluation: The emergent differential diagnosis includes, but is not limited to, gastroenteritis, GERD, PUD, GERD, pancreatitis, hepatitis, cholangitis, biliary colic, cholecystitis, ACS, AAS. This is not an exhaustive differential.   Past Medical History / Co-morbidities / Social History: HTN, anal fissure, diverticulitis, thyroid  disease, gout  Additional history: Chart reviewed. Pertinent results include:   Last seen by ENT on 11//2025 and  diagnosed with Mnire's disease of right side  Lab Tests/Imaging studies: I personally interpreted labs/imaging and the pertinent results include: CBC unremarkable CMP unremarkable Troponin unremarkable Lipase unremarkable. CT scan shows diverticulosis without diverticulitis but otherwise unremarkable.  I agree with the radiologist interpretation.  Cardiac monitoring: EKG obtained and interpreted by myself and attending physician which shows: NSR  EKG Interpretation Date/Time:  Thursday September 18 2024 19:09:35 EST Ventricular Rate:  72 PR Interval:  173 QRS Duration:  97 QT Interval:  394 QTC Calculation: 432 R Axis:   16  Text Interpretation: Sinus rhythm Normal ECG When compared with ECG of 03/30/2022, No significant change was found Confirmed by Raford Lenis ([id number]) on 09/19/2024 12:40:46 AM          Medications: I ordered medication including Pepcid, Maalox, Protonix .  I have reviewed the patients home  medicines and have made adjustments as needed.  Critical Interventions: None  Social Determinants of Health: Has good PCP follow-up  Disposition: After consideration of the diagnostic results and the patients response to treatment, I feel that the patient would benefit from discharge and treatment as above.   emergency department workup does not suggest an emergent condition requiring admission or immediate intervention beyond what has been performed at this time. The plan is: Follow-up with PCP, symptomatic management at home, return for new or worsening symptoms. The patient is safe for discharge and has been instructed to return immediately for worsening symptoms, change in symptoms or any other concerns.   Final diagnoses:  Diarrhea, unspecified type  Generalized abdominal pain    ED Discharge Orders          Ordered    famotidine (PEPCID) 20 MG tablet  2 times daily        09/19/24 0325    pantoprazole  (PROTONIX ) 20 MG tablet  Daily        09/19/24 0325                Brittanny Levenhagen S, PA-C 09/19/24 0328    Raford Lenis, MD 09/19/24 [phone]

## 2024-09-19 NOTE — Discharge Instructions (Addendum)
 You are seen today for abdominal pain with diarrhea.  With your symptoms having improved with our medications  today, and reassuring lab work and imaging and physical exam, low suspicion for any emergent cause recent today.  I am sending you home with some medications to use to help with stomach pain, taking Pepcid as needed, taking Protonix  daily until seen by PCP.  You have any new or worsening symptoms include fever, uncontrollable vomiting, blood in urine or stool, or uncontrollable pain, please return to the ER for further evaluation.

## 2024-09-21 ENCOUNTER — Other Ambulatory Visit: Payer: Self-pay | Admitting: Family Medicine

## 2024-09-26 ENCOUNTER — Encounter: Payer: Self-pay | Admitting: Family Medicine

## 2024-09-26 ENCOUNTER — Ambulatory Visit: Admitting: Family Medicine

## 2024-09-26 VITALS — BP 120/84 | HR 60 | Temp 97.9°F | Ht 71.0 in | Wt 320.8 lb

## 2024-09-26 DIAGNOSIS — R1084 Generalized abdominal pain: Secondary | ICD-10-CM | POA: Diagnosis not present

## 2024-09-26 MED ORDER — PANTOPRAZOLE SODIUM 20 MG PO TBEC: 20.0000 mg | DELAYED_RELEASE_TABLET | Freq: Every day | ORAL | 1 refills | Status: AC

## 2024-09-26 NOTE — Patient Instructions (Signed)
 Follow up as needed or as scheduled STOP the Famotidine  (the twice daily medication) CONTINUE the Pantoprazole  once daily Keep up the good work on [employer] [facility] Call with any questions or concerns Stay Safe!  Stay Healthy! Happy Holidays!!!

## 2024-09-26 NOTE — Progress Notes (Unsigned)
" ° °  Subjective:    Patient ID: Rodney Mcclain, male    DOB: 04-26-1976, 48 y.o.   MRN: [id number]  HPI ER f/u- pt resented to ER on 12/11 after 5 days of abd pain and diarrhea.  Pain was worse after eating.  Felt similar to diverticulitis pain he had previously but worse.  He initially went to [facility] but was told to go to the ER for imaging.  Upon arrival in ER, pain was already improving and got significantly better after GI cocktail.  Labs and CT unremarkable.  Pt was sent home w/ Famotidine  and Pantoprazole .    Today pt reports sxs have resolved.  No longer having pain or diarrhea.  Able to eat normally.  Pt has adjusted his diet and is limiting acidic or spicy foods.      Review of Systems For ROS see HPI     Objective:   Physical Exam        Assessment & Plan:  "

## 2025-01-02 ENCOUNTER — Encounter: Admitting: Family Medicine
